# Patient Record
Sex: Male | Born: 1995 | Race: Black or African American | Hispanic: No | Marital: Single | Smoking: Current every day smoker
Health system: Southern US, Community
[De-identification: ages and names within clinical notes are randomized; demographics above are authoritative.]

## PROBLEM LIST (undated history)

## (undated) DIAGNOSIS — Z5900 Homelessness unspecified: Secondary | ICD-10-CM

## (undated) DIAGNOSIS — S83289A Other tear of lateral meniscus, current injury, unspecified knee, initial encounter: Secondary | ICD-10-CM

## (undated) DIAGNOSIS — M2390 Unspecified internal derangement of unspecified knee: Secondary | ICD-10-CM

## (undated) HISTORY — PX: APPENDECTOMY: SHX54

---
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
## 2015-07-04 ENCOUNTER — Encounter (HOSPITAL_COMMUNITY): Payer: Self-pay | Admitting: Emergency Medicine

## 2015-07-04 ENCOUNTER — Emergency Department (HOSPITAL_COMMUNITY): Payer: Medicaid Other

## 2015-07-04 ENCOUNTER — Emergency Department (HOSPITAL_COMMUNITY)
Admission: EM | Admit: 2015-07-04 | Discharge: 2015-07-04 | Disposition: A | Discharge status: Home / Self Care | Payer: Medicaid Other | Attending: Emergency Medicine | Admitting: Emergency Medicine

## 2015-07-04 DIAGNOSIS — S8991XA Unspecified injury of right lower leg, initial encounter: Secondary | ICD-10-CM | POA: Diagnosis present

## 2015-07-04 DIAGNOSIS — Y9367 Activity, basketball: Secondary | ICD-10-CM | POA: Insufficient documentation

## 2015-07-04 DIAGNOSIS — X58XXXA Exposure to other specified factors, initial encounter: Secondary | ICD-10-CM | POA: Diagnosis not present

## 2015-07-04 DIAGNOSIS — Y999 Unspecified external cause status: Secondary | ICD-10-CM | POA: Insufficient documentation

## 2015-07-04 DIAGNOSIS — Y9289 Other specified places as the place of occurrence of the external cause: Secondary | ICD-10-CM | POA: Diagnosis not present

## 2015-07-04 DIAGNOSIS — F17210 Nicotine dependence, cigarettes, uncomplicated: Secondary | ICD-10-CM | POA: Diagnosis not present

## 2015-07-04 DIAGNOSIS — Z96652 Presence of left artificial knee joint: Secondary | ICD-10-CM | POA: Diagnosis not present

## 2015-07-04 DIAGNOSIS — S8391XA Sprain of unspecified site of right knee, initial encounter: Secondary | ICD-10-CM | POA: Diagnosis not present

## 2015-07-04 MED ORDER — MELOXICAM 15 MG PO TABS: 15.0000 mg | ORAL_TABLET | Freq: Every day | ORAL | Status: DC

## 2015-07-04 NOTE — Discharge Instructions (Signed)
NO Exercising until cleared by orthopedics doctor. Keep the knee elevated when at home, ice several times a day. Avoid strenuous activity. Mobic as prescribed for pain and inflammation. Call orthopedic specialist tomorrow to get an appointment.  Knee Sprain A knee sprain is a tear in one of the strong, fibrous tissues that connect the bones (ligaments) in your knee. The severity of the sprain depends on how much of the ligament is torn. The tear can be either partial or complete. CAUSES  Often, sprains are a result of a fall or injury. The force of the impact causes the fibers of your ligament to stretch too much. This excess tension causes the fibers of your ligament to tear. SIGNS AND SYMPTOMS  You may have some loss of motion in your knee. Other symptoms include:  Bruising.  Pain in the knee area.  Tenderness of the knee to the touch.  Swelling. DIAGNOSIS  To diagnose a knee sprain, your health care provider will physically examine your knee. Your health care provider may also suggest an X-ray exam of your knee to make sure no bones are broken. TREATMENT  If your ligament is only partially torn, treatment usually involves keeping the knee in a fixed position (immobilization) or bracing your knee for activities that require movement for several weeks. To do this, your health care provider will apply a bandage, cast, or splint to keep your knee from moving and to support your knee during movement until it heals. For a partially torn ligament, the healing process usually takes 4-6 weeks. If your ligament is completely torn, depending on which ligament it is, you may need surgery to reconnect the ligament to the bone or reconstruct it. After surgery, a cast or splint may be applied and will need to stay on your knee for 4-6 weeks while your ligament heals. HOME CARE INSTRUCTIONS  Keep your injured knee elevated to decrease swelling.  To ease pain and swelling, apply ice to the injured  area:  Put ice in a plastic bag.  Place a towel between your skin and the bag.  Leave the ice on for 20 minutes, 2-3 times a day.  Only take medicine for pain as directed by your health care provider.  Do not leave your knee unprotected until pain and stiffness go away (usually 4-6 weeks).  If you have a cast or splint, do not allow it to get wet. If you have been instructed not to remove it, cover it with a plastic bag when you shower or bathe. Do not swim.  Your health care provider may suggest exercises for you to do during your recovery to prevent or limit permanent weakness and stiffness. SEEK IMMEDIATE MEDICAL CARE IF:  Your cast or splint becomes damaged.  Your pain becomes worse.  You have significant pain, swelling, or numbness below the cast or splint. MAKE SURE YOU:  Understand these instructions.  Will watch your condition.  Will get help right away if you are not doing well or get worse.   This information is not intended to replace advice given to you by your health care provider. Make sure you discuss any questions you have with your health care provider.   Document Released: 01/26/2005 Document Revised: 02/16/2014 Document Reviewed: 09/07/2012 Elsevier Interactive Patient Education [employer]2016 Elsevier Inc.

## 2015-07-04 NOTE — ED Provider Notes (Signed)
CSN: [id number]     Arrival date & time 07/04/15  1930 History  By signing my name below, I, Bryan Payne, attest that this documentation has been prepared under the direction and in the presence of non-physician practitioner, Jaynie Crumble, PA-C. Electronically Signed: Marisue Payne, Scribe. 07/04/2015. 8:33 PM.    Chief Complaint  Patient presents with  . Knee Pain    The history is provided by the patient. No language interpreter was used.   HPI Comments:  Bryan Payne is a 20 y.o. male with PMHx of left knee lateral meniscus tear who presents to the Emergency Department complaining of 6/10 right knee pain worst over his right outer kneecap. Pt was playing basketball 9 days ago and had knee to knee contact with another player. Pt states he felt a pop in the knee. He recently started a new job that requires a lot of standing and he noticed worsening swelling to his right knee in the past week. He has also been doing leg extensions at the gym since the injury. Pt reports he wrapped the knee for a week after the injury; no other treatments attempted PTA.   Past Medical History  Diagnosis Date  . Knee derangement 04/2013    left  . Lateral meniscus tear 04/2013    left knee   Past Surgical History  Procedure Laterality Date  . Laparoscopic appendectomy Right 10/13/2012    Procedure: APPENDECTOMY LAPAROSCOPIC;  Surgeon: Mariella Saa, MD;  Location: [facility];  Service: General;  Laterality: Right;  . Knee arthroscopy with lateral menisectomy Left 05/05/2013    Procedure: LEFT KNEE ARTHROSCOPY WITH LATERAL MENISCAL REPAIR;  Surgeon: Eulas Post, MD;  Location: [facility];  Service: Orthopedics;  Laterality: Left;  . Appendectomy     Family History  Problem Relation Age of Onset  . Kidney failure Father     Renal transplant  . Diabetes Other     Family on father's side   Social History  Substance Use Topics  . Smoking status: Current Some Day  Smoker    Types: Cigars  . Smokeless tobacco: Never Used  . Alcohol Use: No    Review of Systems  Musculoskeletal: Positive for joint swelling and arthralgias.    Allergies  Vicks vaporub  Home Medications   Prior to Admission medications   Medication Sig Start Date End Date Taking? Authorizing Provider  acetaminophen (TYLENOL) 325 MG tablet Take 650 mg by mouth every 6 (six) hours as needed for moderate pain.    Historical Provider, MD   BP 112/58 mmHg  Pulse 45  Temp(Src) 98.3 F (36.8 C) (Oral)  Resp 14  Ht  (1.727 m)  Wt 140 lb (63.504 kg)  BMI 21.29 kg/m2  SpO2 100%   Physical Exam  Constitutional: He is oriented to person, place, and time. He appears well-developed and well-nourished. No distress.  HENT:  Head: Normocephalic and atraumatic.  Eyes: Right eye exhibits no discharge. Left eye exhibits no discharge.  Pulmonary/Chest: Effort normal. No respiratory distress.  Musculoskeletal:  Joint effusion in the right knee. It does not appear to be warm to the touch for erythematous. Tender to palpation over lateral joint. Full ROM of the knee joint. Pain with full flexion and extension. Negative anterior and posterior drawer signs. No laxity or pain with medial or lateral stress.  DP pulse intact. Normal hip and ankle  Neurological: He is alert and oriented to person, place, and time. Coordination normal.  Skin: No rash noted. He is not diaphoretic.  Psychiatric: He has a normal mood and affect. His behavior is normal.  Nursing note and vitals reviewed.   ED Course  Procedures  DIAGNOSTIC STUDIES:  Oxygen Saturation is 100% on RA, normal by my interpretation.    COORDINATION OF CARE:  8:21 PM Will refer pt to Dr. Dion SaucierLandau. Discussed treatment plan with pt at bedside and pt agreed to plan.  Labs Review Labs Reviewed - No data to display  Imaging Review Dg Knee Complete 4 Views Right  07/04/2015  CLINICAL DATA:  Acute onset of right knee pain. Injury to  knee while playing basketball. Felt pop in the knee. Initial encounter. EXAM: RIGHT KNEE - COMPLETE 4+ VIEW COMPARISON:  None. FINDINGS: There is no evidence of fracture or dislocation. The joint spaces are preserved. No significant degenerative change is seen; the patellofemoral joint is grossly unremarkable in appearance. A small knee joint effusion is noted. The visualized soft tissues are otherwise unremarkable in appearance. IMPRESSION: 1. No evidence of fracture or dislocation. 2. Small knee joint effusion noted. If the patient's symptoms persist, MRI would be helpful for further evaluation, to assess for internal derangement. Electronically Signed   By: Roanna RaiderJeffery  Chang M.D.   On: 07/04/2015 20:27   I have personally reviewed and evaluated these images and lab results as part of my medical decision-making.   EKG Interpretation None      MDM   Final diagnoses:  Right knee sprain, initial encounter   Patient emergency department with right knee injury. Patient does have a joint effusion exam, however he still has full range of motion of the knee joint, is not erythematous or warm to touch. I am not concerned about joint infection. Exam was most consistent with knee sprain, possible tear to the ligamentous structures are meniscus. Patient does not want a knee immobilizer crutches. He states he has to go to work tomorrow. I will give him a knee sleeve for support. Instructed to keep his knee elevated and ice several times a day. We'll start him moving. Patient has seen Dr. Dion SaucierLandau in the past and would like to go see him again. I will refer him back to Dr. Dion SaucierLandau. Dr. Eulah PontMurphy is on call, and is in the same office.  Filed Vitals:   07/04/15 1955  BP: 112/58  Pulse: 45  Temp: 98.3 F (36.8 C)  TempSrc: Oral  Resp: 14  Height: 5\' 8"  (1.727 m)  Weight: 63.504 kg  SpO2: 100%    I personally performed the services described in this documentation, which was scribed in my presence. The recorded  information has been reviewed and is accurate.   Jaynie Crumbleatyana Anndee Connett, PA-C 07/04/15 2207  Rolan BuccoMelanie Belfi, MD 07/04/15 2258

## 2015-07-04 NOTE — ED Notes (Addendum)
Pt c/o right knee pain after having knee to knee contact playing basketball on 5/16.  Pt walk around at work and has had swelling and pain since 5/16.  Pain 6/10 with increasing pain bending the right knee. Ambulatory at present.

## 2016-02-16 ENCOUNTER — Emergency Department (HOSPITAL_COMMUNITY)
Admission: EM | Admit: 2016-02-16 | Discharge: 2016-02-17 | Disposition: A | Discharge status: Home / Self Care | Payer: Medicaid Other | Attending: Emergency Medicine | Admitting: Emergency Medicine

## 2016-02-16 ENCOUNTER — Encounter (HOSPITAL_COMMUNITY): Payer: Self-pay | Admitting: Emergency Medicine

## 2016-02-16 DIAGNOSIS — Z7982 Long term (current) use of aspirin: Secondary | ICD-10-CM | POA: Diagnosis not present

## 2016-02-16 DIAGNOSIS — F329 Major depressive disorder, single episode, unspecified: Secondary | ICD-10-CM | POA: Insufficient documentation

## 2016-02-16 DIAGNOSIS — F1014 Alcohol abuse with alcohol-induced mood disorder: Secondary | ICD-10-CM | POA: Insufficient documentation

## 2016-02-16 DIAGNOSIS — Z79899 Other long term (current) drug therapy: Secondary | ICD-10-CM | POA: Diagnosis not present

## 2016-02-16 DIAGNOSIS — Z833 Family history of diabetes mellitus: Secondary | ICD-10-CM | POA: Diagnosis not present

## 2016-02-16 DIAGNOSIS — F17210 Nicotine dependence, cigarettes, uncomplicated: Secondary | ICD-10-CM | POA: Diagnosis not present

## 2016-02-16 DIAGNOSIS — Z9889 Other specified postprocedural states: Secondary | ICD-10-CM | POA: Diagnosis not present

## 2016-02-16 LAB — COMPREHENSIVE METABOLIC PANEL
ALK PHOS: 45 U/L (ref 38–126)
ALT: 16 U/L — ABNORMAL LOW (ref 17–63)
ANION GAP: 12 (ref 5–15)
AST: 25 U/L (ref 15–41)
Albumin: 5.5 g/dL — ABNORMAL HIGH (ref 3.5–5.0)
BUN: 18 mg/dL (ref 6–20)
CALCIUM: 9.4 mg/dL (ref 8.9–10.3)
CHLORIDE: 100 mmol/L — AB (ref 101–111)
CO2: 23 mmol/L (ref 22–32)
Creatinine, Ser: 0.96 mg/dL (ref 0.61–1.24)
GFR calc non Af Amer: >60 mL/min (ref >60)
Glucose, Bld: 83 mg/dL (ref 65–99)
POTASSIUM: 3.5 mmol/L (ref 3.5–5.1)
SODIUM: 135 mmol/L (ref 135–145)
Total Bilirubin: 1.4 mg/dL — ABNORMAL HIGH (ref 0.3–1.2)
Total Protein: 8.2 g/dL — ABNORMAL HIGH (ref 6.5–8.1)

## 2016-02-16 LAB — RAPID URINE DRUG SCREEN, HOSP PERFORMED
Amphetamines: NOT DETECTED
Barbiturates: NOT DETECTED
Benzodiazepines: NOT DETECTED
COCAINE: NOT DETECTED
OPIATES: NOT DETECTED
TETRAHYDROCANNABINOL: POSITIVE — AB

## 2016-02-16 LAB — CBC
HCT: 45 % (ref 39.0–52.0)
Hemoglobin: 15.7 g/dL (ref 13.0–17.0)
MCH: 30.7 pg (ref 26.0–34.0)
MCHC: 34.9 g/dL (ref 30.0–36.0)
MCV: 88.1 fL (ref 78.0–100.0)
Platelets: 238 10*3/uL (ref 150–400)
RBC: 5.11 MIL/uL (ref 4.22–5.81)
RDW: 12.5 % (ref 11.5–15.5)
WBC: 8.9 10*3/uL (ref 4.0–10.5)

## 2016-02-16 LAB — ACETAMINOPHEN LEVEL

## 2016-02-16 LAB — ETHANOL: Alcohol, Ethyl (B): <5 mg/dL (ref <5)

## 2016-02-16 LAB — SALICYLATE LEVEL

## 2016-02-16 MED ORDER — ACETAMINOPHEN 325 MG PO TABS: 650.0000 mg | ORAL_TABLET | ORAL | Status: DC | PRN

## 2016-02-16 MED ORDER — LORAZEPAM 1 MG PO TABS: 1.0000 mg | ORAL_TABLET | Freq: Three times a day (TID) | ORAL | Status: DC | PRN

## 2016-02-16 NOTE — BHH Counselor (Signed)
Spoke w/ Jeanice LimHolly, RN who reports she will need 5 minutes in order to set up the machine for the TTS assessment.   Princess BruinsAquicha Duff, MSW, Theresia MajorsLCSWA

## 2016-02-16 NOTE — ED Provider Notes (Signed)
[facility]-EM[facility]ENCY [facility]PT Provider Note   CSN: [id number] Arrival date & time: 02/16/16  2019     History   Chief Complaint Chief Complaint  Patient presents with  . Medical Clearance    HPI Bryan Payne is a 21 y.o. male.  Patient is a 21 year old male who presents with worsening depression and thoughts of suicide. He states that he's had a lot of things she's had a deal with over the last month and he feels like his depression has gotten worse. He also has a problem controlling his anger. He feels like he wants to punch things. He's had vague thoughts of suicide today but no definite plan. He denies any physical complaints other than he has some ongoing knee pain. He denies any recent injuries. He does occasionally drink alcohol but denies any recent EtOH or drug use.      Past Medical History:  Diagnosis Date  . Knee derangement 04/2013   left  . Lateral meniscus tear 04/2013   left knee    Patient Active Problem List   Diagnosis Date Noted  . Mushroom poisoning 12/22/2014  . Acute renal failure (HCC) 12/22/2014  . Marijuana abuse 12/22/2014  . Dehydration 12/22/2014  . Ingestion of unknown nonmedicinal substance 12/21/2014  . Rectal bleeding 12/21/2014  . Generalized abdominal pain 12/21/2014  . Lateral meniscus tear 05/05/2013  . Appendicitis, acute-gangrenous s/p laparoscopic appendectomy  10/13/2012    Past Surgical History:  Procedure Laterality Date  . APPENDECTOMY    . KNEE ARTHROSCOPY WITH LATERAL MENISECTOMY Left 05/05/2013   Procedure: LEFT KNEE ARTHROSCOPY WITH LATERAL MENISCAL REPAIR;  Surgeon: Eulas PostJoshua P Landau, MD;  Location: [facility];  Service: Orthopedics;  Laterality: Left;  . LAPAROSCOPIC APPENDECTOMY Right 10/13/2012   Procedure: APPENDECTOMY LAPAROSCOPIC;  Surgeon: Mariella SaaBenjamin T Hoxworth, MD;  Location: [facility];  Service: General;  Laterality: Right;       Home Medications    Prior to Admission medications   Medication Sig Start  Date End Date Taking? Authorizing Provider  acetaminophen (TYLENOL) 325 MG tablet Take 650 mg by mouth every 6 (six) hours as needed for moderate pain.   Yes Historical Provider, MD  Aspirin-Acetaminophen (GOODY BODY PAIN) 500-325 MG PACK Take 1 Package by mouth every 6 (six) hours as needed.   Yes Historical Provider, MD    Family History Family History  Problem Relation Age of Onset  . Kidney failure Father     Renal transplant  . Diabetes Other     Family on father's side    Social History Social History  Substance Use Topics  . Smoking status: Current Every Day Smoker    Types: Cigars, Cigarettes  . Smokeless tobacco: Never Used  . Alcohol use Yes     Comment: "often"     Allergies   Vicks vaporub [camph-eucalypt-men-turp-pet]   Review of Systems Review of Systems  Constitutional: Negative for chills, diaphoresis, fatigue and fever.  HENT: Negative for congestion, rhinorrhea and sneezing.   Eyes: Negative.   Respiratory: Negative for cough, chest tightness and shortness of breath.   Cardiovascular: Negative for chest pain and leg swelling.  Gastrointestinal: Negative for abdominal pain, blood in stool, diarrhea, nausea and vomiting.  Genitourinary: Negative for difficulty urinating, flank pain, frequency and hematuria.  Musculoskeletal: Positive for arthralgias. Negative for back pain.  Skin: Negative for rash.  Neurological: Negative for dizziness, speech difficulty, weakness, numbness and headaches.  Psychiatric/Behavioral: Positive for dysphoric mood and suicidal ideas.  Physical Exam Updated Vital Signs BP 120/69 (BP Location: Right Arm)   Pulse (!) 57   Temp 98 F (36.7 C) (Oral)   Resp 15   SpO2 97%   Physical Exam  Constitutional: He is oriented to person, place, and time. He appears well-developed and well-nourished.  HENT:  Head: Normocephalic and atraumatic.  Eyes: Pupils are equal, round, and reactive to light.  Neck: Normal range of  motion. Neck supple.  Cardiovascular: Normal rate, regular rhythm and normal heart sounds.   Pulmonary/Chest: Effort normal and breath sounds normal. No respiratory distress. He has no wheezes. He has no rales. He exhibits no tenderness.  Abdominal: Soft. Bowel sounds are normal. There is no tenderness. There is no rebound and no guarding.  Musculoskeletal: Normal range of motion. He exhibits no edema.  No swelling or pain on palpation of the knees  Lymphadenopathy:    He has no cervical adenopathy.  Neurological: He is alert and oriented to person, place, and time.  Skin: Skin is warm and dry. No rash noted.  Psychiatric: He has a normal mood and affect.     ED Treatments / Results  Labs (all labs ordered are listed, but only abnormal results are displayed) Labs Reviewed  COMPREHENSIVE METABOLIC PANEL - Abnormal; Notable for the following:       Result Value   Chloride 100 (*)    Total Protein 8.2 (*)    Albumin 5.5 (*)    ALT 16 (*)    Total Bilirubin 1.4 (*)    All other components within normal limits  ACETAMINOPHEN LEVEL - Abnormal; Notable for the following:    Acetaminophen (Tylenol), Serum <10 (*)    All other components within normal limits  RAPID URINE DRUG SCREEN, HOSP PERFORMED - Abnormal; Notable for the following:    Tetrahydrocannabinol POSITIVE (*)    All other components within normal limits  ETHANOL  SALICYLATE LEVEL  CBC    EKG  EKG Interpretation None       Radiology No results found.  Procedures Procedures (including critical care time)  Medications Ordered in ED Medications  LORazepam (ATIVAN) tablet 1 mg (not administered)  acetaminophen (TYLENOL) tablet 650 mg (not administered)     Initial Impression / Assessment and Plan / ED Course  I have reviewed the triage vital signs and the nursing notes.  Pertinent labs & imaging results that were available during my care of the patient were reviewed by me and considered in my medical  decision making (see chart for details).  Clinical Course     Patient is medically cleared, awaiting TTS evaluation  Final Clinical Impressions(s) / ED Diagnoses   Final diagnoses:  Depression, unspecified depression type    New Prescriptions New Prescriptions   No medications on file     Rolan Bucco, MD 02/16/16 2224

## 2016-02-16 NOTE — ED Notes (Signed)
MD at bedside. 

## 2016-02-16 NOTE — BHH Counselor (Signed)
[facility] and she reports she attempted to complete the assessment in person but the pt was unresponsive.  Bryan BruinsAquicha Duff, MSW, Theresia MajorsLCSWA

## 2016-02-16 NOTE — ED Triage Notes (Signed)
Pt come to ER c/o feeling depressed and overwhelmed; states he's felt "off and on" like this for years; pt has passive SI without plan or intent; endorses impulsivity and decreased appetitie; also states he has been "in my head" and "thinking too much"; denies hallucinations; reports distress associated with thoughts of harming others he gets in arguments with; denies pain; A&Ox4

## 2016-02-16 NOTE — ED Notes (Signed)
Pt changed into scrubs and wanded by security  

## 2016-02-17 DIAGNOSIS — Z841 Family history of disorders of kidney and ureter: Secondary | ICD-10-CM

## 2016-02-17 DIAGNOSIS — F17210 Nicotine dependence, cigarettes, uncomplicated: Secondary | ICD-10-CM

## 2016-02-17 DIAGNOSIS — Z79899 Other long term (current) drug therapy: Secondary | ICD-10-CM | POA: Diagnosis not present

## 2016-02-17 DIAGNOSIS — F1014 Alcohol abuse with alcohol-induced mood disorder: Secondary | ICD-10-CM | POA: Diagnosis present

## 2016-02-17 DIAGNOSIS — Z888 Allergy status to other drugs, medicaments and biological substances status: Secondary | ICD-10-CM

## 2016-02-17 DIAGNOSIS — Z9889 Other specified postprocedural states: Secondary | ICD-10-CM

## 2016-02-17 DIAGNOSIS — Z833 Family history of diabetes mellitus: Secondary | ICD-10-CM

## 2016-02-17 NOTE — Consult Note (Signed)
[place] [facility] Consult   Reason for Consult:  Alcohol abuse and depression Referring Physician:  EDP Patient Identification: KHOI HAMBERGER MRN:  [id number] Principal Diagnosis: Alcohol abuse with alcohol-induced mood disorder [facility]) Diagnosis:   Patient Active Problem List   Diagnosis Date Noted  . Alcohol abuse with alcohol-induced mood disorder ([place]) [F10.14] 02/17/2016    Priority: High  . Mushroom poisoning [T62.0X1A] 12/22/2014  . Acute renal failure ([place]) [N17.9] 12/22/2014  . Marijuana abuse [F12.10] 12/22/2014  . Dehydration [E86.0] 12/22/2014  . Ingestion of unknown nonmedicinal substance [T65.91XA] 12/21/2014  . Rectal bleeding [K62.5] 12/21/2014  . Generalized abdominal pain [R10.84] 12/21/2014  . Lateral meniscus tear [S83.289A] 05/05/2013  . Appendicitis, acute-gangrenous s/p laparoscopic appendectomy  [K35.80] 10/13/2012    Total Time spent with patient: 45 minutes  Subjective:   SIRIS HOOS is a 21 y.o. male patient states, "Just need some resources for alcohol abuse."  HPI:  21 yo male who presented to the ED with requests for alcohol abuse resources.  Denies suicidal/homicidal ideations, hallucinations, and withdrawal symptoms.  He has tried AA in the past.  Referral to [facility] for alcohol abuse assistance.  Stable for discharge.  Past Psychiatric History: substance abuse  Risk to Self: Suicidal Ideation: No-Not Currently/Within Last 6 Months Suicidal Intent: No Is patient at risk for suicide?: No Suicidal Plan?: No Access to Means: No What has been your use of drugs/alcohol within the last 12 months?: Marijuana, cigarettes and alcohol. How many times?: 0 Other Self Harm Risks: NA Triggers for Past Attempts: None known Intentional Self Injurious Behavior: None (Pt denies. ) Risk to Others: None Prior Inpatient Therapy: Prior Inpatient Therapy: No Prior Therapy Dates: NA Prior Therapy Facilty/Provider(s): NA Reason for  Treatment: NA Prior Outpatient Therapy: Prior Outpatient Therapy: No Prior Therapy Dates: NA Prior Therapy Facilty/Provider(s): NA Reason for Treatment: NA Does patient have an ACCT team?: No Does patient have Intensive In-House Services?  : No Does patient have [facility] services? : No Does patient have P4CC services?: No  Past Medical History:  Past Medical History:  Diagnosis Date  . Knee derangement 04/2013   left  . Lateral meniscus tear 04/2013   left knee    Past Surgical History:  Procedure Laterality Date  . APPENDECTOMY    . KNEE ARTHROSCOPY WITH LATERAL MENISECTOMY Left 05/05/2013   Procedure: LEFT KNEE ARTHROSCOPY WITH LATERAL MENISCAL REPAIR;  Surgeon: Johnny Bridge, MD;  Location: [place];  Service: Orthopedics;  Laterality: Left;  . LAPAROSCOPIC APPENDECTOMY Right 10/13/2012   Procedure: APPENDECTOMY LAPAROSCOPIC;  Surgeon: Edward Jolly, MD;  Location[facility];  Service: General;  Laterality: Right;   Family History:  Family History  Problem Relation Age of Onset  . Kidney failure Father     Renal transplant  . Diabetes Other     Family on father's side   Family Psychiatric  History: none Social History:  History  Alcohol Use  . Yes    Comment: "often"     History  Drug Use  . Types: Marijuana    Comment: daily    Social History   Social History  . Marital status: Single    Spouse name: N/A  . Number of children: N/A  . Years of education: N/A   Social History Main Topics  . Smoking status: Current Every Day Smoker    Types: Cigars, Cigarettes  . Smokeless tobacco: Never Used  . Alcohol use Yes     Comment: "  often"  . Drug use:     Types: Marijuana     Comment: daily  . Sexual activity: No   Other Topics Concern  . None   Social History Narrative  . None   Additional Social History:    Allergies:   Allergies  Allergen Reactions  . Vicks Vaporub [Camph-Eucalypt-Men-Turp-Pet] Other (See Comments)    Caused  eye irritation and shortness of breath    Labs:  Results for orders placed or performed during the hospital encounter of 02/16/16 (from the past 48 hour(s))  Rapid urine drug screen (hospital performed)     Status: Abnormal   Collection Time: 02/16/16  9:08 PM  Result Value Ref Range   Opiates NONE DETECTED NONE DETECTED   Cocaine NONE DETECTED NONE DETECTED   Benzodiazepines NONE DETECTED NONE DETECTED   Amphetamines NONE DETECTED NONE DETECTED   Tetrahydrocannabinol POSITIVE (A) NONE DETECTED   Barbiturates NONE DETECTED NONE DETECTED    Comment:        DRUG SCREEN FOR MEDICAL PURPOSES ONLY.  IF CONFIRMATION IS NEEDED FOR ANY PURPOSE, NOTIFY LAB WITHIN 5 DAYS.        LOWEST DETECTABLE LIMITS FOR URINE DRUG SCREEN Drug Class       Cutoff (ng/mL) Amphetamine      1000 Barbiturate      200 Benzodiazepine   093 Tricyclics       818 Opiates          300 Cocaine          300 THC              50   Comprehensive metabolic panel     Status: Abnormal   Collection Time: 02/16/16  9:16 PM  Result Value Ref Range   Sodium 135 135 - 145 mmol/L   Potassium 3.5 3.5 - 5.1 mmol/L   Chloride 100 (L) 101 - 111 mmol/L   CO2 23 22 - 32 mmol/L   Glucose, Bld 83 65 - 99 mg/dL   BUN 18 6 - 20 mg/dL   Creatinine, Ser 0.96 0.61 - 1.24 mg/dL   Calcium 9.4 8.9 - 10.3 mg/dL   Total Protein 8.2 (H) 6.5 - 8.1 g/dL   Albumin 5.5 (H) 3.5 - 5.0 g/dL   AST 25 15 - 41 U/L   ALT 16 (L) 17 - 63 U/L   Alkaline Phosphatase 45 38 - 126 U/L   Total Bilirubin 1.4 (H) 0.3 - 1.2 mg/dL   GFR calc non Af Amer >60 >60 mL/min   GFR calc Af Amer >60 >60 mL/min    Comment: (NOTE) The eGFR has been calculated using the CKD EPI equation. This calculation has not been validated in all clinical situations. eGFR's persistently <60 mL/min signify possible Chronic Kidney Disease.    Anion gap 12 5 - 15  Ethanol     Status: None   Collection Time: 02/16/16  9:16 PM  Result Value Ref Range   Alcohol, Ethyl (B) <5  <5 mg/dL    Comment:        LOWEST DETECTABLE LIMIT FOR SERUM ALCOHOL IS 5 mg/dL FOR MEDICAL PURPOSES ONLY   Salicylate level     Status: None   Collection Time: 02/16/16  9:16 PM  Result Value Ref Range   Salicylate Lvl <2.9 2.8 - 30.0 mg/dL  Acetaminophen level     Status: Abnormal   Collection Time: 02/16/16  9:16 PM  Result Value Ref Range   Acetaminophen (Tylenol), Serum <  10 (L) 10 - 30 ug/mL    Comment:        THERAPEUTIC CONCENTRATIONS VARY SIGNIFICANTLY. A RANGE OF 10-30 ug/mL MAY BE AN EFFECTIVE CONCENTRATION FOR MANY PATIENTS. HOWEVER, SOME ARE BEST TREATED AT CONCENTRATIONS OUTSIDE THIS RANGE. ACETAMINOPHEN CONCENTRATIONS >150 ug/mL AT 4 HOURS AFTER INGESTION AND >50 ug/mL AT 12 HOURS AFTER INGESTION ARE OFTEN ASSOCIATED WITH TOXIC REACTIONS.   cbc     Status: None   Collection Time: 02/16/16  9:16 PM  Result Value Ref Range   WBC 8.9 4.0 - 10.5 K/uL   RBC 5.11 4.22 - 5.81 MIL/uL   Hemoglobin 15.7 13.0 - 17.0 g/dL   HCT 45.0 39.0 - 52.0 %   MCV 88.1 78.0 - 100.0 fL   MCH 30.7 26.0 - 34.0 pg   MCHC 34.9 30.0 - 36.0 g/dL   RDW 12.5 11.5 - 15.5 %   Platelets 238 150 - 400 K/uL    Current Facility-Administered Medications  Medication Dose Route Frequency Provider Last Rate Last Dose  . acetaminophen (TYLENOL) tablet 650 mg  650 mg Oral Q4H PRN Malvin Johns, MD       Current Outpatient Prescriptions  Medication Sig Dispense Refill  . acetaminophen (TYLENOL) 325 MG tablet Take 650 mg by mouth every 6 (six) hours as needed for moderate pain.    . Aspirin-Acetaminophen (GOODY BODY PAIN) 500-325 MG PACK Take 1 Package by mouth every 6 (six) hours as needed.      Musculoskeletal: Strength & Muscle Tone: within normal limits Gait & Station: normal Patient leans: N/A  Psychiatric Specialty Exam: Physical Exam  Constitutional: He is oriented to person, place, and time. He appears well-developed and well-nourished.  HENT:  Head: Normocephalic.   Respiratory: Effort normal.  Musculoskeletal: Normal range of motion.  Neurological: He is alert and oriented to person, place, and time.  Psychiatric: His speech is normal and behavior is normal. Judgment and thought content normal. Cognition and memory are normal. He exhibits a depressed mood.    Review of Systems  Psychiatric/Behavioral: Positive for depression and substance abuse.  All other systems reviewed and are negative.   Blood pressure 126/55, pulse (!) 56, temperature 98 F (36.7 C), temperature source Oral, resp. rate 15, SpO2 98 %.There is no height or weight on file to calculate BMI.  General Appearance: Casual  Eye Contact:  Good  Speech:  Normal Rate  Volume:  Normal  Mood:  Depressed, mild  Affect:  Congruent  Thought Process:  Coherent and Descriptions of Associations: Intact  Orientation:  Full (Time, Place, and Person)  Thought Content:  WDL and Logical  Suicidal Thoughts:  No  Homicidal Thoughts:  No  Memory:  Immediate;   Good Recent;   Good Remote;   Good  Judgement:  Fair  Insight:  Fair  Psychomotor Activity:  Normal  Concentration:  Concentration: Good and Attention Span: Good  Recall:  Good  Fund of Knowledge:  Fair  Language:  Good  Akathisia:  No  Handed:  Right  AIMS (if indicated):     Assets:  Housing Leisure Time Physical Health Resilience Social Support  ADL's:  Intact  Cognition:  WNL  Sleep:        Treatment Plan Summary: Daily contact with patient to assess and evaluate symptoms and progress in treatment, Medication management and Plan Alcohol abuse with alcohol-induced mood disorder ([place])  -Crisis stabilization -Medication management:  No medications started, outpatient referral -Individual and substance abuse counseling -Outpatient resources provided  Disposition: No evidence of imminent risk to self or others at present.    Waylan Boga, NP 02/17/2016 11:11 AM  Patient seen face-to-face for psychiatric evaluation,  chart reviewed and case discussed with the physician extender and developed treatment plan. Reviewed the information documented and agree with the treatment plan. Corena Pilgrim, MD

## 2016-02-17 NOTE — ED Notes (Signed)
TTS at bedside. 
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
## 2016-02-17 NOTE — Discharge Instructions (Signed)
For your ongoing behavioral health needs you are advised to follow up with [facility].  New patients are seen at their walk-in clinic.  Walk-in hours are Monday - Friday from 8:00 am - 12:00 pm, and from 1:00 pm - 3:00 pm.  Walk-in patients are seen on a first come, first served basis, so try to arrive as early as possible for the best chance of being seen the same day.  There is an initial fee of $22.50: ° °     [facility] °     [address] °     [place], [place] [address] °     [phone] °

## 2016-02-17 NOTE — BH Assessment (Signed)
[facility] Assessment Progress Note  Per Thedore MinsMojeed Akintayo, MD, this pt does not require psychiatric hospitalization at this time.  [facility].  This has been included in pt's discharge instructions.  Pt's nurse, Verlon AuLeslie, has been notified.  Doylene Canninghomas Skii Cleland, MA Triage Specialist [phone]

## 2016-02-18 NOTE — [facility] Suicide Risk Assessment (Signed)
Suicide Risk Assessment  Discharge Assessment   [facility]   Principal Problem: Alcohol abuse with alcohol-induced mood disorder [facility](HCC) Discharge Diagnoses:  Patient Active Problem List   Diagnosis Date Noted  . Alcohol abuse with alcohol-induced mood disorder (HCC) [F10.14] 02/17/2016    Priority: High  . Mushroom poisoning [T62.0X1A] 12/22/2014  . Acute renal failure (HCC) [N17.9] 12/22/2014  . Marijuana abuse [F12.10] 12/22/2014  . Dehydration [E86.0] 12/22/2014  . Ingestion of unknown nonmedicinal substance [T65.91XA] 12/21/2014  . Rectal bleeding [K62.5] 12/21/2014  . Generalized abdominal pain [R10.84] 12/21/2014  . Lateral meniscus tear [S83.289A] 05/05/2013  . Appendicitis, acute-gangrenous s/p laparoscopic appendectomy  [K35.80] 10/13/2012    Total Time spent with patient: 45 minutes  Musculoskeletal: Strength & Muscle Tone: within normal limits Gait & Station: normal Patient leans: N/A  Psychiatric Specialty Exam: Physical Exam  Constitutional: He is oriented to person, place, and time. He appears well-developed and well-nourished.  HENT:  Head: Normocephalic.  Respiratory: Effort normal.  Musculoskeletal: Normal range of motion.  Neurological: He is alert and oriented to person, place, and time.  Psychiatric: His speech is normal and behavior is normal. Judgment and thought content normal. Cognition and memory are normal. He exhibits a depressed mood.    Review of Systems  Psychiatric/Behavioral: Positive for depression and substance abuse.  All other systems reviewed and are negative.   Blood pressure 126/55, pulse (!) 56, temperature 98 F (36.7 C), temperature source Oral, resp. rate 15, SpO2 98 %.There is no height or weight on file to calculate BMI.  General Appearance: Casual  Eye Contact:  Good  Speech:  Normal Rate  Volume:  Normal  Mood:  Depressed, mild  Affect:  Congruent  Thought Process:  Coherent and Descriptions of  Associations: Intact  Orientation:  Full (Time, Place, and Person)  Thought Content:  WDL and Logical  Suicidal Thoughts:  No  Homicidal Thoughts:  No  Memory:  Immediate;   Good Recent;   Good Remote;   Good  Judgement:  Fair  Insight:  Fair  Psychomotor Activity:  Normal  Concentration:  Concentration: Good and Attention Span: Good  Recall:  Good  Fund of Knowledge:  Fair  Language:  Good  Akathisia:  No  Handed:  Right  AIMS (if indicated):     Assets:  Housing Leisure Time Physical Health Resilience Social Support  ADL's:  Intact  Cognition:  WNL  Sleep:      Mental Status Per Nursing Assessment::   On Admission:   alcohol abuse with depression  Demographic Factors:  Male and Adolescent or young adult  Loss Factors: NA  Historical Factors: NA  Risk Reduction Factors:   Sense of responsibility to family, Living with another person, especially a relative and Positive social support  Continued Clinical Symptoms:  Depression, mild  Cognitive Features That Contribute To Risk:  None    Suicide Risk:  Minimal: No identifiable suicidal ideation.  Patients presenting with no risk factors but with morbid ruminations; may be classified as minimal risk based on the severity of the depressive symptoms    Plan Of Care/Follow-up recommendations:  Activity:  as tolerated Diet:  heart healthy diet  LORD, JAMISON, NP 02/18/2016, 8:58 AM

## 2016-06-26 ENCOUNTER — Encounter (HOSPITAL_COMMUNITY): Payer: Self-pay | Admitting: Emergency Medicine

## 2016-06-26 ENCOUNTER — Emergency Department (HOSPITAL_COMMUNITY)
Admission: EM | Admit: 2016-06-26 | Discharge: 2016-06-26 | Disposition: A | Discharge status: Home / Self Care | Payer: Medicaid Other | Attending: Emergency Medicine | Admitting: Emergency Medicine

## 2016-06-26 DIAGNOSIS — Y929 Unspecified place or not applicable: Secondary | ICD-10-CM | POA: Insufficient documentation

## 2016-06-26 DIAGNOSIS — Y999 Unspecified external cause status: Secondary | ICD-10-CM | POA: Insufficient documentation

## 2016-06-26 DIAGNOSIS — Y939 Activity, unspecified: Secondary | ICD-10-CM | POA: Insufficient documentation

## 2016-06-26 DIAGNOSIS — Z7982 Long term (current) use of aspirin: Secondary | ICD-10-CM | POA: Insufficient documentation

## 2016-06-26 DIAGNOSIS — Z79899 Other long term (current) drug therapy: Secondary | ICD-10-CM | POA: Insufficient documentation

## 2016-06-26 DIAGNOSIS — W57XXXA Bitten or stung by nonvenomous insect and other nonvenomous arthropods, initial encounter: Secondary | ICD-10-CM

## 2016-06-26 DIAGNOSIS — S30860A Insect bite (nonvenomous) of lower back and pelvis, initial encounter: Secondary | ICD-10-CM | POA: Insufficient documentation

## 2016-06-26 DIAGNOSIS — F17210 Nicotine dependence, cigarettes, uncomplicated: Secondary | ICD-10-CM | POA: Insufficient documentation

## 2016-06-26 MED ORDER — DOXYCYCLINE HYCLATE 100 MG PO CAPS: 100.0000 mg | ORAL_CAPSULE | Freq: Two times a day (BID) | ORAL | 0 refills | Status: AC

## 2016-06-26 NOTE — ED Provider Notes (Signed)
[facility]-EMERGENCY DEPT Provider Note   CSN: [id number] Arrival date & time: 06/26/16  1801  By signing my name below, I, Sonum Patel, attest that this documentation has been prepared under the direction and in the presence of [address] [place], [place]. Electronically Signed: Sonum Allena Katz, Scribe. 06/26/16. 6:53 PM.  History   Chief Complaint Chief Complaint  Patient presents with  . Insect Bite    Tick    The history is provided by the patient. No language interpreter was used.    HPI Comments: Bryan Payne is a 21 y.o. male who presents to the Emergency Department complaining of a tick bite that occurred 2 days ago. He reports removing the tick from his left pelvic area. He now has a painful bump to the same area with associated redness, swelling and itching. He describes the pain as an aching sensation and rates it as 3/10 while walking.  He states applied pressure worsens his pain. He denies prior known tick bites. He denies discharge, other rashes, fever, chills, nausea, vomiting, diarrhea.    Past Medical History:  Diagnosis Date  . Knee derangement 04/2013   left  . Lateral meniscus tear 04/2013   left knee    Patient Active Problem List   Diagnosis Date Noted  . Alcohol abuse with alcohol-induced mood disorder (HCC) 02/17/2016  . Mushroom poisoning 12/22/2014  . Acute renal failure (HCC) 12/22/2014  . Marijuana abuse 12/22/2014  . Dehydration 12/22/2014  . Ingestion of unknown nonmedicinal substance 12/21/2014  . Rectal bleeding 12/21/2014  . Generalized abdominal pain 12/21/2014  . Lateral meniscus tear 05/05/2013  . Appendicitis, acute-gangrenous s/p laparoscopic appendectomy  10/13/2012    Past Surgical History:  Procedure Laterality Date  . APPENDECTOMY    . KNEE ARTHROSCOPY WITH LATERAL MENISECTOMY Left 05/05/2013   Procedure: LEFT KNEE ARTHROSCOPY WITH LATERAL MENISCAL REPAIR;  Surgeon: Eulas Post, MD;  Location: [facility];  Service:  Orthopedics;  Laterality: Left;  . LAPAROSCOPIC APPENDECTOMY Right 10/13/2012   Procedure: APPENDECTOMY LAPAROSCOPIC;  Surgeon: Mariella Saa, MD;  Location: [facility];  Service: General;  Laterality: Right;       Home Medications    Prior to Admission medications   Medication Sig Start Date End Date Taking? Authorizing Provider  acetaminophen (TYLENOL) 325 MG tablet Take 650 mg by mouth every 6 (six) hours as needed for moderate pain.    [provider]  Aspirin-Acetaminophen (GOODY BODY PAIN) 500-325 MG PACK Take 1 Package by mouth every 6 (six) hours as needed.    [provider]  doxycycline (VIBRAMYCIN) 100 MG capsule Take 1 capsule (100 mg total) by mouth 2 (two) times daily. 06/26/16 07/10/16  Alvina Chou, PA    Family History Family History  Problem Relation Age of Onset  . Kidney failure Father        Renal transplant  . Diabetes Other        Family on father's side    Social History Social History  Substance Use Topics  . Smoking status: Current Every Day Smoker    Packs/day: 0.50    Types: Cigars, Cigarettes  . Smokeless tobacco: Never Used  . Alcohol use Yes     Comment: "often"     Allergies   Vicks vaporub [camph-eucalypt-men-turp-pet]   Review of Systems Review of Systems  Constitutional: Negative for chills and fever.  Respiratory: Negative for shortness of breath.   Cardiovascular: Negative for chest pain.  Gastrointestinal: Negative for diarrhea, nausea  and vomiting.  Skin: Positive for wound.     Physical Exam Updated Vital Signs BP (!) 101/52 (BP Location: Right Arm)   Pulse 72   Temp 99 F (37.2 C) (Oral)   Resp 16   Ht 5\' 6"  (1.676 m)   Wt 130 lb (59 kg)   SpO2 96%   BMI 20.98 kg/m   Physical Exam  Constitutional: He appears well-developed and well-nourished. No distress.  Well appearing. Airway intact. No drooling, stridor, trismus. Handling secretions well. No evidence of mouth, lips, tongue  swelling.  HENT:  Head: Normocephalic and atraumatic.  Nose: Nose normal.  Mouth/Throat: Oropharynx is clear and moist.  Eyes: Conjunctivae and EOM are normal. Pupils are equal, round, and reactive to light.  Neck: Normal range of motion.  Cardiovascular: Normal rate, regular rhythm, normal heart sounds and intact distal pulses.   No murmur heard. Pulmonary/Chest: Effort normal and breath sounds normal. No stridor. No respiratory distress. He has no wheezes. He has no rales.  Normal work of breathing. No respiratory distress noted.   Abdominal: Soft. Bowel sounds are normal. There is no tenderness. There is no rebound and no guarding.  Musculoskeletal: Normal range of motion.  Neurological: He is alert.  Skin: Skin is warm. Capillary refill takes less than 2 seconds.  2.5 cm diameter purple dot with lesion in the center. Mildly tender to palpation. No discharge noted. No bulla, vesicles, or evidence of secondary infection. No other evidence of rash to her wrists, face, torso, back, upper chambers, lower extremities.  Psychiatric: He has a normal mood and affect. His behavior is normal.  Nursing note and vitals reviewed.    ED Treatments / Results  DIAGNOSTIC STUDIES: Oxygen Saturation is 96% on RA, adequate by my interpretation.    COORDINATION OF CARE: 6:51 PM Discussed treatment plan with pt at bedside and pt agreed to plan.   Labs (all labs ordered are listed, but only abnormal results are displayed) Labs Reviewed - No data to display  EKG  EKG Interpretation None       Radiology No results found.  Procedures Procedures (including critical care time)  Medications Ordered in ED Medications - No data to display   Initial Impression / Assessment and Plan / ED Course  I have reviewed the triage vital signs and the nursing notes.  Pertinent labs & imaging results that were available during my care of the patient were reviewed by me and considered in my medical  decision making (see chart for details).    Patient with alleged tick bite x 2 days. Worsening surrounding erythema around the area. Slightly tender to palpation. No discharge. No signs of allergic reaction.  Suspect possible [facility] fever, although he does not appear to have the classic symptoms of a target lesion, erythema migrans rash on wrists.  Patient denies any difficulty breathing or swallowing.  Pt has a patent airway without stridor and is handling secretions without difficulty; no angioedema. No blisters, no pustules, no warmth, no draining sinus tracts, no superficial abscesses, no bullous impetigo, no vesicles, no desquamation.  No concern for superimposed infection. No concern for SJS, TEN, TSS, syphilis or other life-threatening condition. Patient will be treated prophylactically and given doxycycline. I feel safe for discharge at this time. Patient is afebrile, hemodynamically stable, in no apparent distress. Patient is to follow-up with his primary care provider regarding this visit. Reasons to immediately return to ED discussed.  Final Clinical Impressions(s) / ED Diagnoses  Final diagnoses:  Tick bite, initial encounter    New Prescriptions New Prescriptions   DOXYCYCLINE (VIBRAMYCIN) 100 MG CAPSULE    Take 1 capsule (100 mg total) by mouth 2 (two) times daily.   I personally performed the services described in this documentation, which was scribed in my presence. The recorded information has been reviewed and is accurate.   Candie Mile [place], [place] 06/26/16 1912    Tilden Fossa, MD 06/27/16 [phone]

## 2016-06-26 NOTE — Discharge Instructions (Signed)
Please take doxycycline 2 times a day daily for 14 days. Please make sure to complete the course as prescribed. Please drink plenty of fluids throughout the day. Keep area clean and dry.   Get help right away if: You are not able to remove a tick. A part of a tick breaks off and gets stuck in your skin. Your symptoms get worse. Get help right away if: Your symptoms get worse. You feel very sleepy. You develop vomiting or diarrhea that persists. You notice red streaks coming from the infected area. Your red area gets larger or turns dark in color.

## 2016-06-26 NOTE — ED Triage Notes (Signed)
Pt c/o tick bite x3 days ago. Located on L pelvic region. Pt states he removed tick however now c/o swelling and itching.

## 2017-04-03 ENCOUNTER — Other Ambulatory Visit: Payer: Self-pay

## 2017-04-03 ENCOUNTER — Emergency Department (HOSPITAL_COMMUNITY)
Admission: EM | Admit: 2017-04-03 | Discharge: 2017-04-03 | Disposition: A | Discharge status: Home / Self Care | Payer: Self-pay | Attending: Physician Assistant | Admitting: Physician Assistant

## 2017-04-03 DIAGNOSIS — Y999 Unspecified external cause status: Secondary | ICD-10-CM | POA: Insufficient documentation

## 2017-04-03 DIAGNOSIS — Z590 Homelessness: Secondary | ICD-10-CM | POA: Insufficient documentation

## 2017-04-03 DIAGNOSIS — Y939 Activity, unspecified: Secondary | ICD-10-CM | POA: Insufficient documentation

## 2017-04-03 DIAGNOSIS — S70362A Insect bite (nonvenomous), left thigh, initial encounter: Secondary | ICD-10-CM | POA: Insufficient documentation

## 2017-04-03 DIAGNOSIS — Y929 Unspecified place or not applicable: Secondary | ICD-10-CM | POA: Insufficient documentation

## 2017-04-03 DIAGNOSIS — W57XXXA Bitten or stung by nonvenomous insect and other nonvenomous arthropods, initial encounter: Secondary | ICD-10-CM | POA: Insufficient documentation

## 2017-04-03 DIAGNOSIS — F17210 Nicotine dependence, cigarettes, uncomplicated: Secondary | ICD-10-CM | POA: Insufficient documentation

## 2017-04-03 MED ORDER — DOXYCYCLINE HYCLATE 100 MG PO TABS
100.0000 mg | ORAL_TABLET | Freq: Once | ORAL | Status: AC
  Administered 2017-04-03: 100 mg via ORAL
  Filled 2017-04-03: qty 1

## 2017-04-03 MED ORDER — DOXYCYCLINE HYCLATE 100 MG PO CAPS: 100.0000 mg | ORAL_CAPSULE | Freq: Two times a day (BID) | ORAL | 0 refills | Status: AC

## 2017-04-03 NOTE — Discharge Instructions (Addendum)
Keep area around the skin lesion clean. Use alcohol wipe to wipe the area at least once daily and apply thin layer of antibiotic ointment. Avoid itching or scratching to prevent further inflammation and infection. Return to the emergency department if there is increased swelling, redness, pain, warmth, pus, fevers.Guidelines recommend treatment within 72 hours of tick exposure, however given lymph nodes swelling will treat you for lyme disease at this time as a preventative measure.   Lyme disease symptoms include fatigue, decreased appetite, recurrent headaches, diffuse body and bone pain, fevers.

## 2017-04-03 NOTE — ED Provider Notes (Signed)
[facility]-EMERGENCY DEPT Provider Note   CSN: [id number] Arrival date & time: 04/03/17  [id number]     History   Chief Complaint No chief complaint on file.   HPI Bryan Payne is a 22 y.o. male this here for evaluation of insect bite to the left groin for the last month. He removed a tick rom this area approximately one month ago.  He is not sure if it was a deer tick or not, he does not remember it being engorged. Since then this area has been itchy and he has been continuously scratching it. Has developed an open wound to the area that feels "bumpy". He was talking to a friend who told him he may have Lyme disease and he is concerned about this. Patient is homeless and has been living in the streets. He has not been sexually active in over a year and has never been tested for STDs, has no previous diagnosis of this. Denies urinary symptoms, penile discharge, testicular pain. No fevers, chills, chest pain, shortness of breath, cough, cold-like symptoms, abdominal pain  HPI  Past Medical History:  Diagnosis Date  . Knee derangement 04/2013   left  . Lateral meniscus tear 04/2013   left knee    Patient Active Problem List   Diagnosis Date Noted  . Alcohol abuse with alcohol-induced mood disorder (HCC) 02/17/2016  . Mushroom poisoning 12/22/2014  . Acute renal failure (HCC) 12/22/2014  . Marijuana abuse 12/22/2014  . Dehydration 12/22/2014  . Ingestion of unknown nonmedicinal substance 12/21/2014  . Rectal bleeding 12/21/2014  . Generalized abdominal pain 12/21/2014  . Lateral meniscus tear 05/05/2013  . Appendicitis, acute-gangrenous s/p laparoscopic appendectomy  10/13/2012    Past Surgical History:  Procedure Laterality Date  . APPENDECTOMY    . KNEE ARTHROSCOPY WITH LATERAL MENISECTOMY Left 05/05/2013   Procedure: LEFT KNEE ARTHROSCOPY WITH LATERAL MENISCAL REPAIR;  Surgeon: Eulas PostJoshua P Landau, MD;  Location: [facility];  Service:  Orthopedics;  Laterality: Left;  . LAPAROSCOPIC APPENDECTOMY Right 10/13/2012   Procedure: APPENDECTOMY LAPAROSCOPIC;  Surgeon: Mariella SaaBenjamin T Hoxworth, MD;  Location: [facility];  Service: General;  Laterality: Right;       Home Medications    Prior to Admission medications   Medication Sig Start Date End Date Taking? Authorizing Provider  acetaminophen (TYLENOL) 325 MG tablet Take 650 mg by mouth every 6 (six) hours as needed for moderate pain.    [provider]  Aspirin-Acetaminophen (GOODY BODY PAIN) 500-325 MG PACK Take 1 Package by mouth every 6 (six) hours as needed.    [provider]  doxycycline (VIBRAMYCIN) 100 MG capsule Take 1 capsule (100 mg total) by mouth 2 (two) times daily for 10 days. 04/03/17 04/13/17  Liberty HandyGibbons, Stehanie Ekstrom J, PA-C    Family History Family History  Problem Relation Age of Onset  . Kidney failure Father        Renal transplant  . Diabetes Other        Family on father's side    Social History Social History   Tobacco Use  . Smoking status: Current Every Day Smoker    Packs/day: 0.50    Types: Cigars, Cigarettes  . Smokeless tobacco: Never Used  Substance Use Topics  . Alcohol use: Yes    Comment: "often"  . Drug use: Yes    Types: Marijuana    Comment: daily     Allergies   Vicks vaporub [camph-eucalypt-men-turp-pet]   Review of Systems Review of  Systems  Skin: Positive for color change and wound.  All other systems reviewed and are negative.    Physical Exam Updated Vital Signs BP 125/74 (BP Location: Right Arm)   Pulse (!) 53   Temp 98.1 F (36.7 C) (Oral)   Resp 18   Ht 5\' 9"  (1.753 m)   Wt 59 kg (130 lb)   SpO2 99%   BMI 19.20 kg/m   Physical Exam  Constitutional: He is oriented to person, place, and time. He appears well-developed and well-nourished. No distress.  NAD. Poor hygiene.  HENT:  Head: Normocephalic and atraumatic.  Right Ear: External ear normal.  Left Ear: External ear normal.  Nose: Nose  normal.  Eyes: Conjunctivae and EOM are normal. No scleral icterus.  Neck: Normal range of motion.  Cardiovascular: Normal rate, regular rhythm, normal heart sounds and intact distal pulses.  No murmur heard. Pulmonary/Chest: Effort normal and breath sounds normal. He has no wheezes.  Abdominal: Soft. There is no tenderness.  No suprapubic or CVA tenderness. No guarding, rigidity, rebound.  Genitourinary:  Genitourinary Comments: External genitalia normal without erythema, edema, tenderness or lesions. No meatus discharge.  Glans and shaft smooth without tenderness, lesions, masses or deformity.  Scrotum without lesions or edema.  Non tender testicles. Epididymis and spermatic cord without tenderness or masses, bilaterally.  Musculoskeletal: Normal range of motion. He exhibits no deformity.  Lymphadenopathy:  There is nontender, bilateral inguinal lymphadenopathy approx 1-2 cm in diameter  Neurological: He is alert and oriented to person, place, and time.  Skin: Skin is warm and dry. Capillary refill takes less than 2 seconds. Lesion noted.  Erythematous flat lesion to left groin/upper thigh without tenderness, edema, fluctuance, warmth, drainage. There are 2-3 other hyperpigmented macules in this area as well.  Psychiatric: He has a normal mood and affect. His behavior is normal. Judgment and thought content normal.  Nursing note and vitals reviewed.    ED Treatments / Results  Labs (all labs ordered are listed, but only abnormal results are displayed) Labs Reviewed - No data to display  EKG  EKG Interpretation None       Radiology No results found.  Procedures Procedures (including critical care time)  Medications Ordered in ED Medications  doxycycline (VIBRA-TABS) tablet 100 mg (not administered)     Initial Impression / Assessment and Plan / ED Course  I have reviewed the triage vital signs and the nursing notes.  Pertinent labs & imaging results that were  available during my care of the patient were reviewed by me and considered in my medical decision making (see chart for details).    22 year old male with history of homelessness is here with concern of Lyme disease. Removed a tick from his left groin one month ago and has had persistent itching to this area and has developed a lesion. Based on my exam, lesion looks inflamed but not infected. Approximately 1 x 1 cm, nontender. Patient also has nearby hyperpigmented macules and I wonder if this is an acute on chronic problem. No signs of systemic infection.  No symptoms of Lyme disease.  He is requesting new socks and lotion, given. Discussed wound care. I gave him bacitracin and alcohol wipes and advised to keep irritated lesion clean to prevent infection.   Infectious Diseases Society of America (IDSA) guidelines do not recommend doxycyline prophylaxis at this time as it has been > 72 hours. However he has local borderline lymphadenopathy.  Given risk and local lymphadenopathy will tx with  doxycyline. Discussed return precautions that would warrant return to ED. Pt verbalized understanding. Gave him bag full of hygiene prodcuts before dc.   Final Clinical Impressions(s) / ED Diagnoses   Final diagnoses:  Insect bite, initial encounter    ED Discharge Orders        Ordered    doxycycline (VIBRAMYCIN) 100 MG capsule  2 times daily     04/03/17 0821       Liberty Handy, PA-C 04/03/17 1610    Abelino Derrick, MD 04/04/17 1601

## 2017-04-12 ENCOUNTER — Emergency Department (HOSPITAL_COMMUNITY): Payer: Self-pay

## 2017-04-12 ENCOUNTER — Emergency Department (HOSPITAL_COMMUNITY)
Admission: EM | Admit: 2017-04-12 | Discharge: 2017-04-12 | Disposition: A | Discharge status: Home / Self Care | Payer: Self-pay | Attending: Emergency Medicine | Admitting: Emergency Medicine

## 2017-04-12 ENCOUNTER — Encounter (HOSPITAL_COMMUNITY): Payer: Self-pay

## 2017-04-12 ENCOUNTER — Other Ambulatory Visit: Payer: Self-pay

## 2017-04-12 DIAGNOSIS — F17210 Nicotine dependence, cigarettes, uncomplicated: Secondary | ICD-10-CM | POA: Insufficient documentation

## 2017-04-12 DIAGNOSIS — S20319A Abrasion of unspecified front wall of thorax, initial encounter: Secondary | ICD-10-CM | POA: Insufficient documentation

## 2017-04-12 DIAGNOSIS — F1910 Other psychoactive substance abuse, uncomplicated: Secondary | ICD-10-CM

## 2017-04-12 DIAGNOSIS — S20419A Abrasion of unspecified back wall of thorax, initial encounter: Secondary | ICD-10-CM | POA: Insufficient documentation

## 2017-04-12 DIAGNOSIS — S01412A Laceration without foreign body of left cheek and temporomandibular area, initial encounter: Secondary | ICD-10-CM | POA: Insufficient documentation

## 2017-04-12 DIAGNOSIS — F1293 Cannabis use, unspecified with withdrawal: Secondary | ICD-10-CM | POA: Insufficient documentation

## 2017-04-12 DIAGNOSIS — Y929 Unspecified place or not applicable: Secondary | ICD-10-CM | POA: Insufficient documentation

## 2017-04-12 DIAGNOSIS — S0990XA Unspecified injury of head, initial encounter: Secondary | ICD-10-CM | POA: Insufficient documentation

## 2017-04-12 DIAGNOSIS — T07XXXA Unspecified multiple injuries, initial encounter: Secondary | ICD-10-CM

## 2017-04-12 DIAGNOSIS — S0181XA Laceration without foreign body of other part of head, initial encounter: Secondary | ICD-10-CM

## 2017-04-12 DIAGNOSIS — Y999 Unspecified external cause status: Secondary | ICD-10-CM | POA: Insufficient documentation

## 2017-04-12 DIAGNOSIS — T1490XA Injury, unspecified, initial encounter: Secondary | ICD-10-CM

## 2017-04-12 DIAGNOSIS — Y939 Activity, unspecified: Secondary | ICD-10-CM | POA: Insufficient documentation

## 2017-04-12 LAB — COMPREHENSIVE METABOLIC PANEL
ALBUMIN: 3.4 g/dL — AB (ref 3.5–5.0)
ALT: 16 U/L — AB (ref 17–63)
AST: 29 U/L (ref 15–41)
Alkaline Phosphatase: 25 U/L — ABNORMAL LOW (ref 38–126)
Anion gap: 8 (ref 5–15)
BILIRUBIN TOTAL: 0.7 mg/dL (ref 0.3–1.2)
BUN: 8 mg/dL (ref 6–20)
CO2: 24 mmol/L (ref 22–32)
Calcium: 8.4 mg/dL — ABNORMAL LOW (ref 8.9–10.3)
Chloride: 107 mmol/L (ref 101–111)
Creatinine, Ser: 1.21 mg/dL (ref 0.61–1.24)
GFR calc Af Amer: >60 mL/min (ref >60)
GFR calc non Af Amer: >60 mL/min (ref >60)
Glucose, Bld: 139 mg/dL — ABNORMAL HIGH (ref 65–99)
POTASSIUM: 3.2 mmol/L — AB (ref 3.5–5.1)
Sodium: 139 mmol/L (ref 135–145)
Total Protein: 5.4 g/dL — ABNORMAL LOW (ref 6.5–8.1)

## 2017-04-12 LAB — CBC
HEMATOCRIT: 41.1 % (ref 39.0–52.0)
Hemoglobin: 13.8 g/dL (ref 13.0–17.0)
MCH: 31.3 pg (ref 26.0–34.0)
MCHC: 33.6 g/dL (ref 30.0–36.0)
MCV: 93.2 fL (ref 78.0–100.0)
PLATELETS: 251 10*3/uL (ref 150–400)
RBC: 4.41 MIL/uL (ref 4.22–5.81)
RDW: 13 % (ref 11.5–15.5)
WBC: 10.7 10*3/uL — ABNORMAL HIGH (ref 4.0–10.5)

## 2017-04-12 LAB — RAPID URINE DRUG SCREEN, HOSP PERFORMED
Amphetamines: NOT DETECTED
Barbiturates: NOT DETECTED
Benzodiazepines: POSITIVE — AB
Cocaine: POSITIVE — AB
OPIATES: NOT DETECTED
TETRAHYDROCANNABINOL: POSITIVE — AB

## 2017-04-12 LAB — ETHANOL: Alcohol, Ethyl (B): <10 mg/dL (ref <10)

## 2017-04-12 MED ORDER — LIDOCAINE-EPINEPHRINE (PF) 2 %-1:200000 IJ SOLN
10.0000 mL | Freq: Once | INTRAMUSCULAR | Status: AC
  Administered 2017-04-12: 10 mL
  Filled 2017-04-12: qty 20
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
## 2017-04-12 NOTE — Discharge Instructions (Signed)
[facility] or see your private physician in 5-7 days for suture removal. Watch for signs of infection, such as redness, swelling, increased pain or drainage of pus.  Return to the ER or see your private physician if these occur.
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
## 2017-04-12 NOTE — ED Notes (Signed)
No respiratory or acute distress noted resting in bed with eyes closed call light in reach no reaction to medication noted. 

## 2017-04-12 NOTE — ED Notes (Signed)
No respiratory or acute distress noted resting in bed with eyes closed no reaction to medication noted [employer] with pt at bedside.

## 2017-04-19 ENCOUNTER — Ambulatory Visit (HOSPITAL_COMMUNITY)
Admission: EM | Admit: 2017-04-19 | Discharge: 2017-04-19 | Disposition: A | Discharge status: Home / Self Care | Payer: Self-pay

## 2017-04-19 DIAGNOSIS — Z4802 Encounter for removal of sutures: Secondary | ICD-10-CM

## 2017-04-19 DIAGNOSIS — S01402S Unspecified open wound of left cheek and temporomandibular area, sequela: Secondary | ICD-10-CM

## 2017-04-19 NOTE — ED Notes (Signed)
Pt here to have 3 sutures removed from L cheek. Wound is well healed, edges approximated. No signs of infection. 3 sutures removed. NO bleeding or drainage noted. Pt given bandaid and bacitracin. Given note. Pt had no further questions, denies pain.

## 2017-04-30 ENCOUNTER — Encounter (HOSPITAL_COMMUNITY): Payer: Self-pay

## 2017-04-30 ENCOUNTER — Emergency Department (HOSPITAL_COMMUNITY)
Admission: EM | Admit: 2017-04-30 | Discharge: 2017-05-01 | Disposition: A | Discharge status: Home / Self Care | Payer: Medicaid Other | Attending: Emergency Medicine | Admitting: Emergency Medicine

## 2017-04-30 DIAGNOSIS — F4329 Adjustment disorder with other symptoms: Secondary | ICD-10-CM | POA: Diagnosis present

## 2017-04-30 DIAGNOSIS — F17210 Nicotine dependence, cigarettes, uncomplicated: Secondary | ICD-10-CM | POA: Insufficient documentation

## 2017-04-30 DIAGNOSIS — F1219 Cannabis abuse with unspecified cannabis-induced disorder: Secondary | ICD-10-CM

## 2017-04-30 DIAGNOSIS — R45851 Suicidal ideations: Secondary | ICD-10-CM

## 2017-04-30 DIAGNOSIS — F1210 Cannabis abuse, uncomplicated: Secondary | ICD-10-CM | POA: Insufficient documentation

## 2017-04-30 LAB — CBC
HCT: 43.7 % (ref 39.0–52.0)
Hemoglobin: 15 g/dL (ref 13.0–17.0)
MCH: 31.6 pg (ref 26.0–34.0)
MCHC: 34.3 g/dL (ref 30.0–36.0)
MCV: 92.2 fL (ref 78.0–100.0)
PLATELETS: 271 10*3/uL (ref 150–400)
RBC: 4.74 MIL/uL (ref 4.22–5.81)
RDW: 12.6 % (ref 11.5–15.5)
WBC: 10.3 10*3/uL (ref 4.0–10.5)

## 2017-04-30 LAB — SALICYLATE LEVEL: Salicylate Lvl: <7 mg/dL (ref 2.8–30.0)

## 2017-04-30 LAB — COMPREHENSIVE METABOLIC PANEL
ALT: 38 U/L (ref 17–63)
AST: 47 U/L — AB (ref 15–41)
Albumin: 4.1 g/dL (ref 3.5–5.0)
Alkaline Phosphatase: 34 U/L — ABNORMAL LOW (ref 38–126)
Anion gap: 8 (ref 5–15)
BUN: 10 mg/dL (ref 6–20)
CO2: 27 mmol/L (ref 22–32)
CREATININE: 0.8 mg/dL (ref 0.61–1.24)
Calcium: 9.2 mg/dL (ref 8.9–10.3)
Chloride: 107 mmol/L (ref 101–111)
GFR calc Af Amer: >60 mL/min (ref >60)
Glucose, Bld: 96 mg/dL (ref 65–99)
POTASSIUM: 3.8 mmol/L (ref 3.5–5.1)
Sodium: 142 mmol/L (ref 135–145)
TOTAL PROTEIN: 6.6 g/dL (ref 6.5–8.1)
Total Bilirubin: 0.6 mg/dL (ref 0.3–1.2)

## 2017-04-30 LAB — URINALYSIS, ROUTINE W REFLEX MICROSCOPIC
Bilirubin Urine: NEGATIVE
Glucose, UA: NEGATIVE mg/dL
Hgb urine dipstick: NEGATIVE
Ketones, ur: NEGATIVE mg/dL
Leukocytes, UA: NEGATIVE
Nitrite: NEGATIVE
Protein, ur: NEGATIVE mg/dL
Specific Gravity, Urine: 1.01 (ref 1.005–1.030)
pH: 8 (ref 5.0–8.0)

## 2017-04-30 LAB — RAPID URINE DRUG SCREEN, HOSP PERFORMED
Amphetamines: NOT DETECTED
Barbiturates: NOT DETECTED
Benzodiazepines: NOT DETECTED
Cocaine: NOT DETECTED
Opiates: NOT DETECTED
Tetrahydrocannabinol: POSITIVE — AB

## 2017-04-30 LAB — ACETAMINOPHEN LEVEL: Acetaminophen (Tylenol), Serum: <10 ug/mL — ABNORMAL LOW (ref 10–30)

## 2017-04-30 LAB — ETHANOL

## 2017-04-30 NOTE — BH Assessment (Addendum)
Assessment Note  Bryan Payne is an 22 y.o. male, who presents voluntary and unaccompanied to [facility]. Pt was a poor historian and was prompted numerous times to engage in assessment. Clinician asked the pt, "what brought you to the hospital?" Pt reported, "I felt like I was going to hurt myself." Pt reported, he did not have a plan. Clinician observed the pt go back and forth, on being suicidal. Pt reported, he was suicidal and wanted to hurt himself earlier today but not currently. Pt reported, "I don't want to hurt or kill myself." Pt reported, he scratches himself. Per EDP/PA note, pt picks skin causing mild bleeding. Pt denies, SI, HI, AVH, and access to weapons.   Pt reported, he did not want to answer questions related to abuse. Pt reported, reported smoking marijuana this month and using CBD. Pt's UDS is positive for marijuana.   Clinician was unable to assess the following: "DSS involvement, being linked to OPT resources, legal involvement, family supports, sleep, appetite, vegetative symptoms, previous inpatient admissions, educational status, history of violence."   Pt presents sleeping in scrubs with slow, soft speech. Pt's mood was helpless. Pt's affect was flat. Pt's thought process was circumstantial. Pt's judgement was partial. Pt was oriented x4. Pt's concentration and insight are poor. Pt's impulse control was fair. Pt reported, if discharged from [facility] he could not contract for safety. Pt reported, if inpatient treatment was recommended he would sign-in voluntarily.   Diagnosis: F33.1 Major Depressive Disorder, recurrent, moderate without psychotic features.          F12.20 Cannabis use Disorder, moderate.  Past Medical History:  Past Medical History:  Diagnosis Date  . Knee derangement 04/2013   left  . Lateral meniscus tear 04/2013   left knee    Past Surgical History:  Procedure Laterality Date  . APPENDECTOMY    . KNEE ARTHROSCOPY WITH LATERAL MENISECTOMY Left 05/05/2013    Procedure: LEFT KNEE ARTHROSCOPY WITH LATERAL MENISCAL REPAIR;  Surgeon: Eulas Post, MD;  Location: [facility];  Service: Orthopedics;  Laterality: Left;  . LAPAROSCOPIC APPENDECTOMY Right 10/13/2012   Procedure: APPENDECTOMY LAPAROSCOPIC;  Surgeon: Mariella Saa, MD;  Location: [facility];  Service: General;  Laterality: Right;    Family History:  Family History  Problem Relation Age of Onset  . Kidney failure Father        Renal transplant  . Diabetes Other        Family on father's side    Social History:  reports that he has been smoking cigars and cigarettes.  He has been smoking about 0.50 packs per day. He has never used smokeless tobacco. He reports that he drinks alcohol. He reports that he has current or past drug history. Drug: Marijuana.  Additional Social History:  Alcohol / Drug Use Pain Medications: See MAR Prescriptions: See MAR  Over the Counter: See MAR History of alcohol / drug use?: Yes Substance #1 Name of Substance 1: Marijuana 1 - Age of First Use: UTA 1 - Amount (size/oz): UTA 1 - Frequency: UTA 1 - Duration: UTA 1 - Last Use / Amount: Sometime this month.  Substance #2 Name of Substance 2: CBD 2 - Age of First Use: UTA 2 - Amount (size/oz): Pt reported, today.  2 - Frequency: UTA 2 - Duration: UTA 2 - Last Use / Amount: UTA  CIWA: CIWA-Ar BP: 129/76 Pulse Rate: (!) 59 COWS:    Allergies:  Allergies  Allergen Reactions  . Vicks  Vaporub [Camph-Eucalypt-Men-Turp-Pet] Other (See Comments)    Caused eye irritation and shortness of breath    Home Medications:  (Not in a hospital admission)  OB/GYN Status:  No LMP for male patient.  General Assessment Data Location of Assessment: [facility] TTS Assessment: In system Is this a Tele or Face-to-Face Assessment?: Face-to-Face Is this an Initial Assessment or a Re-assessment for this encounter?: Initial Assessment Marital status: Single Living Arrangements: Other  (Comment)(Homeless. ) Can pt return to current living arrangement?: Yes Admission Status: Voluntary Is patient capable of signing voluntary admission?: Yes Referral Source: Self/Family/Friend Insurance type: Medicaid.     Crisis Care Plan Living Arrangements: Other (Comment)(Homeless. ) Legal Guardian: Other:(Self.) Name of Psychiatrist: UTA Name of Therapist: UTA  Education Status Is patient currently in school?: No Is the patient employed, unemployed or receiving disability?: Unemployed  Risk to self with the past 6 months Suicidal Ideation: No-Not Currently/Within Last 6 Months Has patient been a risk to self within the past 6 months prior to admission? : Yes Suicidal Intent: No Has patient had any suicidal intent within the past 6 months prior to admission? : No Is patient at risk for suicide?: No Suicidal Plan?: No Has patient had any suicidal plan within the past 6 months prior to admission? : No Access to Means: No What has been your use of drugs/alcohol within the last 12 months?: Marijuana.  Previous Attempts/Gestures: (UTA) How many times?: (UTA) Other Self Harm Risks: Pt scratches his skin.  Triggers for Past Attempts: None known Intentional Self Injurious Behavior: Damaging Comment - Self Injurious Behavior: Pt scratches his skin. Per EDP/PA note: pt picks skin causing mild bleeding. Family Suicide History: Unable to assess Recent stressful life event(s): Other (Comment)(Homelessnes. ) Persecutory voices/beliefs?: No Depression: Yes Depression Symptoms: Feeling worthless/self pity Substance abuse history and/or treatment for substance abuse?: Yes Suicide prevention information given to non-admitted patients: Not applicable  Risk to Others within the past 6 months Homicidal Ideation: No(Pt denies. ) Does patient have any lifetime risk of violence toward others beyond the six months prior to admission? : No Thoughts of Harm to Others: No Current Homicidal  Intent: No Current Homicidal Plan: No Access to Homicidal Means: No Identified Victim: NA History of harm to others?: (UTA) Assessment of Violence: (UTA) Violent Behavior Description: UTA Does patient have access to weapons?: No(Pt denies.) Criminal Charges Pending?: (UTA) Does patient have a court date: (UTA) Is patient on probation?: (UTA)  Psychosis Hallucinations: None noted Delusions: None noted  Mental Status Report Appearance/Hygiene: In scrubs Eye Contact: Poor Motor Activity: Unremarkable Speech: Slow, Soft Level of Consciousness: Sleeping Mood: Helpless Affect: Flat Anxiety Level: Minimal Thought Processes: Circumstantial Judgement: Partial Orientation: Person, Place, Time, Situation Obsessive Compulsive Thoughts/Behaviors: None  Cognitive Functioning Concentration: Poor Memory: Recent Impaired Is patient IDD: No Is patient DD?: No Insight: Poor Impulse Control: Fair Appetite: (UTA) Sleep: Unable to Assess  ADLScreening [facility] Assessment Services) Patient's cognitive ability adequate to safely complete daily activities?: Yes Patient able to express need for assistance with ADLs?: Yes Independently performs ADLs?: Yes (appropriate for developmental age)  Prior Inpatient Therapy Prior Inpatient Therapy: (UTA)  Prior Outpatient Therapy Prior Outpatient Therapy: (UTA)  ADL Screening (condition at time of admission) Patient's cognitive ability adequate to safely complete daily activities?: Yes Is the patient deaf or have difficulty hearing?: No Does the patient have difficulty seeing, even when wearing glasses/contacts?: Yes(Pt wears glasses. ) Does the patient have difficulty concentrating, remembering, or making decisions?: Yes Patient able to  express need for assistance with ADLs?: Yes Does the patient have difficulty dressing or bathing?: No Independently performs ADLs?: Yes (appropriate for developmental age) Does the patient have difficulty walking  or climbing stairs?: Yes(Pt reported, his legs and feet hurt. ) Weakness of Legs: Both(Pt reported, he legs and feet hurt. ) Weakness of Arms/Hands: None  Home Assistive Devices/Equipment Home Assistive Devices/Equipment: Eyeglasses    Abuse/Neglect Assessment (Assessment to be complete while patient is alone) Abuse/Neglect Assessment Can Be Completed: Unable to assess, patient is non-responsive or altered mental status(Pt reported, he did not want to answer the question. )     Advance Directives (For Healthcare) Does Patient Have a Medical Advance Directive?: No Would patient like information on creating a medical advance directive?: No - Patient declined    Additional Information 1:1 In Past 12 Months?: No CIRT Risk: No Elopement Risk: No Does patient have medical clearance?: Yes     Disposition: Jessica, PA recommends overnight observation for safety and stabilization. Disposition discussed with Joanie Coddington, RN.   Disposition Initial Assessment Completed for this Encounter: Yes Patient refused recommended treatment: No Mode of transportation if patient is discharged?: N/A Patient referred to: Other (Comment)( overnight observation for safety and stabilization.)  On Site Evaluation by:  Holly Bodily. Jedrek Dinovo, MS, LPC, CRC. Reviewed with Physician:  Shanda Bumps, [place] and Nira Conn, NP.  Redmond Pulling 04/30/2017 10:48 PM   Redmond Pulling, MS, [facility], [facility] Triage Specialist [phone]

## 2017-04-30 NOTE — Progress Notes (Signed)
Chart reviewed and patient evaluated via telepsych. Alert and oriented x 4, pleasant, and cooperative. Speech is clear and coherent. Mood is depressed.. Affect is congruent with mood. States "I was feeling like I might hurt myself, but was not suicidal." Denies having a plan to hurt himself. Denies current suicidal thoughts or thoughts of self harm. Denies homicidal ideations and audiovisual hallucinations. No indication that he is responding to internal stimuli.  General Appearance: Casual and Fairly Groomed  Eye Contact:  Good  Speech:  Clear and Coherent and Normal Rate  Volume:  Normal  Mood:  Depressed  Affect:  Congruent and Depressed  Orientation:  Full (Time, Place, and Person)  Thought Content:  Logical and Hallucinations: None  Suicidal Thoughts:  No  Homicidal Thoughts:  No  Judgement:  Good  Insight:  Fair  Psychomotor Activity:  Normal  Assets:  Communication Skills Desire for Improvement Leisure Time Physical Health Resilience  ADL's:  Intact     Disposition: No evidence of imminent risk to self or others at present.   Patient does not meet criteria for psychiatric inpatient admission. Supportive therapy provided about ongoing stressors. Discussed crisis plan, support from social network, calling 911, coming to the Emergency Department, and calling Suicide Hotline. TTS to provide with outpatient resources and shelter information.

## 2017-04-30 NOTE — ED Provider Notes (Signed)
[facility]-EMERGENCY DEPT Provider Note   CSN: [id number] Arrival date & time: 04/30/17  1854     History   Chief Complaint No chief complaint on file.   HPI Bryan Payne is a 22 y.o. male with no pertinent past medical history presenting with suicidal ideation and thoughts of harming himself.  He denies any hallucination or HI.  He reports concerns that his father will be upset with him because he forgot his backpack someone's house.  He has been picking at his skin causing mild superficial bleeding his lower extremities.  He admits to marijuana use no alcohol or other drugs of abuse.  He states that he walked from [facility] to here and his legs and feet are sore.   HPI  Past Medical History:  Diagnosis Date  . Knee derangement 04/2013   left  . Lateral meniscus tear 04/2013   left knee    Patient Active Problem List   Diagnosis Date Noted  . Alcohol abuse with alcohol-induced mood disorder (HCC) 02/17/2016  . Mushroom poisoning 12/22/2014  . Acute renal failure (HCC) 12/22/2014  . Marijuana abuse 12/22/2014  . Dehydration 12/22/2014  . Ingestion of unknown nonmedicinal substance 12/21/2014  . Rectal bleeding 12/21/2014  . Generalized abdominal pain 12/21/2014  . Lateral meniscus tear 05/05/2013  . Appendicitis, acute-gangrenous s/p laparoscopic appendectomy  10/13/2012    Past Surgical History:  Procedure Laterality Date  . APPENDECTOMY    . KNEE ARTHROSCOPY WITH LATERAL MENISECTOMY Left 05/05/2013   Procedure: LEFT KNEE ARTHROSCOPY WITH LATERAL MENISCAL REPAIR;  Surgeon: Eulas Post, MD;  Location: [facility];  Service: Orthopedics;  Laterality: Left;  . LAPAROSCOPIC APPENDECTOMY Right 10/13/2012   Procedure: APPENDECTOMY LAPAROSCOPIC;  Surgeon: Mariella Saa, MD;  Location: [facility];  Service: General;  Laterality: Right;        Home Medications    Prior to Admission medications   Medication Sig Start Date End  Date Taking? Authorizing Provider  ibuprofen (ADVIL,MOTRIN) 200 MG tablet Take 200 mg by mouth every 6 (six) hours as needed for moderate pain.   Yes [provider]    Family History Family History  Problem Relation Age of Onset  . Kidney failure Father        Renal transplant  . Diabetes Other        Family on father's side    Social History Social History   Tobacco Use  . Smoking status: Current Every Day Smoker    Packs/day: 0.50    Types: Cigars, Cigarettes  . Smokeless tobacco: Never Used  Substance Use Topics  . Alcohol use: Yes    Comment: "often"  . Drug use: Yes    Types: Marijuana    Comment: daily     Allergies   Vicks vaporub [camph-eucalypt-men-turp-pet]   Review of Systems Review of Systems  Constitutional: Negative for chills, diaphoresis, fatigue and fever.  HENT: Negative for congestion.   Eyes: Negative for photophobia and visual disturbance.  Respiratory: Negative for cough, choking, chest tightness, shortness of breath, wheezing and stridor.   Cardiovascular: Negative for chest pain, palpitations and leg swelling.  Gastrointestinal: Negative for abdominal distention, abdominal pain, blood in stool, diarrhea, nausea and vomiting.  Genitourinary: Negative for difficulty urinating and dysuria.  Musculoskeletal: Positive for myalgias. Negative for arthralgias, back pain, gait problem, joint swelling, neck pain and neck stiffness.  Skin: Positive for wound. Negative for color change and pallor.  Neurological: Negative for dizziness, tremors,  seizures, syncope, facial asymmetry, speech difficulty, weakness, light-headedness, numbness and headaches.  Psychiatric/Behavioral: Positive for self-injury and suicidal ideas. Negative for hallucinations.     Physical Exam Updated Vital Signs BP 129/76 (BP Location: Right Arm)   Pulse (!) 59   Temp 98.5 F (36.9 C) (Oral)   Resp 18   SpO2 100%   Physical Exam  Constitutional: He is oriented to  person, place, and time. He appears well-developed and well-nourished. No distress.  Afebrile, nontoxic-appearing, sitting comfortably in chair in no acute distress.  HENT:  Head: Normocephalic and atraumatic.  Eyes: Conjunctivae and EOM are normal. Right eye exhibits no discharge. Left eye exhibits no discharge.  Neck: Normal range of motion. Neck supple.  Cardiovascular: Normal rate, regular rhythm, normal heart sounds and intact distal pulses.  No murmur heard. Pulmonary/Chest: Effort normal and breath sounds normal. No stridor. No respiratory distress. He has no wheezes. He has no rales. He exhibits no tenderness.  Abdominal: Soft. He exhibits no distension and no mass. There is no tenderness. There is no rebound and no guarding.  Musculoskeletal: Normal range of motion. He exhibits no edema.  Neurological: He is alert and oriented to person, place, and time. No sensory deficit. He exhibits normal muscle tone.  Skin: Skin is warm and dry. No rash noted. He is not diaphoretic. No erythema. No pallor.  Psychiatric: He has a normal mood and affect.  Nursing note and vitals reviewed.    ED Treatments / Results  Labs (all labs ordered are listed, but only abnormal results are displayed) Labs Reviewed  COMPREHENSIVE METABOLIC PANEL  ETHANOL  SALICYLATE LEVEL  ACETAMINOPHEN LEVEL  CBC  RAPID URINE DRUG SCREEN, HOSP PERFORMED    EKG None  Radiology No results found.  Procedures Procedures (including critical care time)  Medications Ordered in ED Medications - No data to display   Initial Impression / Assessment and Plan / ED Course  I have reviewed the triage vital signs and the nursing notes.  Pertinent labs & imaging results that were available during my care of the patient were reviewed by me and considered in my medical decision making (see chart for details).    Patient has been scratching his skin on the anterior right lower extremity with superficial abrasion and  bleeding.  Bleeding controlled, area was cleaned and bandaged.  Lungs are clear to auscultation bilaterally.  Ordered medical clearance labs and TTS consult  Labs unremarkable except for positive cannabinoid.  TTS recommending discharge stating that he denies suicidal ideation or plan at this time and he reported suicidal ideations due to homelessness. I voiced my concerns regarding patient reliability and believe that he may benefit from observation overnight and re-evaluation in the am.  Patient will stay overnight and be re-evaluated in the morning.  Final Clinical Impressions(s) / ED Diagnoses   Final diagnoses:  Suicidal ideation    ED Discharge Orders    None       Gregary CromerMitchell, Aldrick Derrig B, PA-C 04/30/17 2245    Tegeler, Canary Brimhristopher J, MD 04/30/17 [phone]

## 2017-04-30 NOTE — ED Notes (Addendum)
Homeless pt presents with SI, no specific plan noted, reports he started feeling that way while walking to hospital.  Previous SI attempts in past, can't recall when.  Denies HI or AVH.  Feeling hopeless.  A&O x 3, no distress noted, calm & cooperative.  Pt admits to drinking occasional alcohol and CBD oil use.  Monitoring for safety, Q 15 min checks in effect.

## 2017-04-30 NOTE — ED Notes (Signed)
Bed: [facility] Expected date:  Expected time:  Means of arrival:  Comments: 

## 2017-05-01 ENCOUNTER — Other Ambulatory Visit: Payer: Self-pay

## 2017-05-01 ENCOUNTER — Emergency Department (HOSPITAL_COMMUNITY)
Admission: EM | Admit: 2017-05-01 | Discharge: 2017-05-02 | Disposition: A | Discharge status: Home / Self Care | Payer: Medicaid Other | Attending: Emergency Medicine | Admitting: Emergency Medicine

## 2017-05-01 DIAGNOSIS — Z5900 Homelessness unspecified: Secondary | ICD-10-CM

## 2017-05-01 DIAGNOSIS — F1290 Cannabis use, unspecified, uncomplicated: Secondary | ICD-10-CM

## 2017-05-01 DIAGNOSIS — F4329 Adjustment disorder with other symptoms: Secondary | ICD-10-CM | POA: Diagnosis present

## 2017-05-01 DIAGNOSIS — F329 Major depressive disorder, single episode, unspecified: Secondary | ICD-10-CM

## 2017-05-01 DIAGNOSIS — F32A Depression, unspecified: Secondary | ICD-10-CM

## 2017-05-01 DIAGNOSIS — F17210 Nicotine dependence, cigarettes, uncomplicated: Secondary | ICD-10-CM

## 2017-05-01 DIAGNOSIS — R45851 Suicidal ideations: Secondary | ICD-10-CM | POA: Insufficient documentation

## 2017-05-01 MED ORDER — IBUPROFEN 200 MG PO TABS
400.0000 mg | ORAL_TABLET | Freq: Once | ORAL | Status: AC
  Administered 2017-05-01: 400 mg via ORAL
  Filled 2017-05-01: qty 2

## 2017-05-01 MED ORDER — TRAZODONE HCL 50 MG PO TABS
50.0000 mg | ORAL_TABLET | Freq: Once | ORAL | Status: AC
Start: 2017-05-01 — End: 2017-05-01
  Administered 2017-05-01: 50 mg via ORAL
  Filled 2017-05-01: qty 1

## 2017-05-01 NOTE — ED Notes (Addendum)
Written dc instructions reviewed with Pt.  Pt encouraged to folow up with OP providers as directed and with [facility] for [facility].  Pt verbalized understanding. Bus pass given.  Pt ambulatory w/o difficulty to dc area with mHt, belongings returned after leaving the area.

## 2017-05-01 NOTE — ED Notes (Signed)
Up to the the bathroom 

## 2017-05-01 NOTE — ED Notes (Signed)
Pt stripped naked and masturbating in room.

## 2017-05-01 NOTE — ED Provider Notes (Signed)
[facility]-EMERGENCY DEPT Provider Note   CSN: [id number] Arrival date & time: 05/01/17  2229     History   Chief Complaint Chief Complaint  Patient presents with  . Depression    HPI Bryan Payne is a 22 y.o. male.  Patient complains of feeling depressed and being suicidal.  He was seen in the ED yesterday for the same complaints and discharged about 12 hours ago.  He states he feels depressed due to his recent homelessness and was not able to give plasma today and he was depressed about missing his appointment.  He states is not been eating and drinking well because he is homeless and being out in the cold.  He denies abusing any drugs or alcohol.  He is not prescribed any medications.  He denies any hallucinations or homicidal ideation. He states he misses his mother and has been thinking certain strangers might be her.  The history is provided by the patient.    Past Medical History:  Diagnosis Date  . Knee derangement 04/2013   left  . Lateral meniscus tear 04/2013   left knee    Patient Active Problem List   Diagnosis Date Noted  . Adjustment disorder with disturbance of emotion 05/01/2017  . Mushroom poisoning 12/22/2014  . Acute renal failure (HCC) 12/22/2014  . 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 Dehydration 12/22/2014  . Ingestion of unknown nonmedicinal substance 12/21/2014  . Rectal bleeding 12/21/2014  . Generalized abdominal pain 12/21/2014  . Lateral meniscus tear 05/05/2013  . Appendicitis, acute-gangrenous s/p laparoscopic appendectomy  10/13/2012    Past Surgical History:  Procedure Laterality Date  . APPENDECTOMY    . KNEE ARTHROSCOPY WITH LATERAL MENISECTOMY Left 05/05/2013   Procedure: LEFT KNEE ARTHROSCOPY WITH LATERAL MENISCAL REPAIR;  Surgeon: Eulas Post, MD;  Location: [facility];  Service: Orthopedics;  Laterality: Left;  . LAPAROSCOPIC APPENDECTOMY Right 10/13/2012   Procedure: APPENDECTOMY LAPAROSCOPIC;  Surgeon: Mariella Saa, MD;  Location: [facility];  Service: General;  Laterality: Right;        Home Medications    Prior to Admission medications   Medication Sig Start Date End Date Taking? Authorizing Provider  ibuprofen (ADVIL,MOTRIN) 200 MG tablet Take 200 mg by mouth every 6 (six) hours as needed for moderate pain.   Yes [provider]    Family History Family History  Problem Relation Age of Onset  . Kidney failure Father        Renal transplant  . Diabetes Other        Family on father's side    Social History Social History   Tobacco Use  . Smoking status: Current Every Day Smoker    Packs/day: 0.50    Types: Cigars, Cigarettes  . Smokeless tobacco: Never Used  Substance Use Topics  . Alcohol use: Yes    Comment: "often"  . Drug use: Yes    Types: Marijuana    Comment: daily     Allergies   Vicks vaporub [camph-eucalypt-men-turp-pet]   Review of Systems Review of Systems  Constitutional: Negative for activity change, appetite change and fever.  HENT: Negative for congestion and sinus pain.   Respiratory: Negative for cough, chest tightness and shortness of breath.   Cardiovascular: Negative for chest pain.  Gastrointestinal: Negative for abdominal pain, nausea and vomiting.  Genitourinary: Negative for dysuria, penile swelling and scrotal swelling.  Musculoskeletal: Negative for arthralgias and myalgias.  Neurological: Negative for dizziness,  weakness and headaches.  Psychiatric/Behavioral: Positive for confusion, hallucinations, self-injury and suicidal ideas. The patient is nervous/anxious.    all other systems are negative except as noted in the HPI and PMH.     Physical Exam Updated Vital Signs BP 129/78 (BP Location: Left Arm)   Pulse (!) 51   Temp 98 F (36.7 C) (Oral)   Resp 15   Wt 54.6 kg (120 lb 6.4 oz)   SpO2 99%   BMI 17.28 kg/m   Physical Exam  Constitutional: He is  oriented to person, place, and time. He appears well-developed and well-nourished. No distress.  Flat affect  HENT:  Head: Normocephalic and atraumatic.  Mouth/Throat: Oropharynx is clear and moist. No oropharyngeal exudate.  Eyes: Pupils are equal, round, and reactive to light. Conjunctivae and EOM are normal.  Neck: Normal range of motion. Neck supple.  No meningismus.  Cardiovascular: Normal rate, regular rhythm, normal heart sounds and intact distal pulses.  No murmur heard. Pulmonary/Chest: Effort normal and breath sounds normal. No respiratory distress. He exhibits no tenderness.  Abdominal: Soft. There is no tenderness. There is no rebound and no guarding.  Musculoskeletal: Normal range of motion. He exhibits no edema or tenderness.  Neurological: He is alert and oriented to person, place, and time. No cranial nerve deficit. He exhibits normal muscle tone. Coordination normal.  No ataxia on finger to nose bilaterally. No pronator drift. 5/5 strength throughout. CN 2-12 intact.Equal grip strength. Sensation intact.   Skin: Skin is warm. Capillary refill takes less than 2 seconds. No rash noted.  Psychiatric: He has a normal mood and affect. His behavior is normal.  Nursing note and vitals reviewed.    ED Treatments / Results  Labs (all labs ordered are listed, but only abnormal results are displayed) Labs Reviewed  COMPREHENSIVE METABOLIC PANEL - Abnormal; Notable for the following components:      Result Value   Total Protein 6.4 (*)    Alkaline Phosphatase 31 (*)    All other components within normal limits  ACETAMINOPHEN LEVEL - Abnormal; Notable for the following components:   Acetaminophen (Tylenol), Serum <10 (*)    All other components within normal limits  CBC WITH DIFFERENTIAL/PLATELET  ETHANOL  SALICYLATE LEVEL    EKG None  Radiology No results found.  Procedures Procedures (including critical care time)  Medications Ordered in ED Medications    ibuprofen (ADVIL,MOTRIN) tablet 400 mg (400 mg Oral Given 05/01/17 2354)     Initial Impression / Assessment and Plan / ED Course  I have reviewed the triage vital signs and the nursing notes.  Pertinent labs & imaging results that were available during my care of the patient were reviewed by me and considered in my medical decision making (see chart for details).    Homeless male presents with depression, fatigue, anorexia.  Seen for same and discharged less than 12 hours ago.  He is cleared by psychiatry.  He denies any suicidal homicidal thoughts currently but has had these thoughts in the past.  Patient seems to have some secondary gain as he is homeless and looking for a place to sleep.  His labs are reassuring. Patient's exam is benign.  Case discussed with TTS counselor Ala Dach.  Patient has no suicidal ideation, homicidal ideation, hallucinations at this time.  He does not meet inpatient treatment criteria. Patient given a list of outpatient resources for homeless shelters. Return precautions discussed Final Clinical Impressions(s) / ED Diagnoses   Final diagnoses:  Depression, unspecified  depression type  Homelessness    ED Discharge Orders    None       Rawn Quiroa, Jeannett SeniorStephen, MD 05/03/17 0300

## 2017-05-01 NOTE — [facility] Suicide Risk Assessment (Signed)
Suicide Risk Assessment  Discharge Assessment   [facility]   Principal Problem: Adjustment disorder with disturbance of emotion Discharge Diagnoses:  Patient Active Problem List   Diagnosis Date Noted  . Adjustment disorder with disturbance of emotion [F43.29] 05/01/2017    Priority: High  . Mushroom poisoning [T62.0X1A] 12/22/2014  . Acute renal failure (HCC) [N17.9] 12/22/2014  . Cannabis abuse with cannabis-induced disorder (HCC) [F12.19] 12/22/2014  . Dehydration [E86.0] 12/22/2014  . Ingestion of unknown nonmedicinal substance [T65.91XA] 12/21/2014  . Rectal bleeding [K62.5] 12/21/2014  . Generalized abdominal pain [R10.84] 12/21/2014  . Lateral meniscus tear [S83.289A] 05/05/2013  . Appendicitis, acute-gangrenous s/p laparoscopic appendectomy  [K35.80] 10/13/2012    Total Time spent with patient: 45 minutes  Musculoskeletal: Strength & Muscle Tone: within normal limits Gait & Station: normal Patient leans: N/A  Psychiatric Specialty Exam: Physical Exam  Constitutional: He is oriented to person, place, and time. He appears well-developed and well-nourished.  HENT:  Head: Normocephalic.  Neck: Normal range of motion.  Respiratory: Effort normal.  Musculoskeletal: Normal range of motion.  Neurological: He is alert and oriented to person, place, and time.  Psychiatric: He has a normal mood and affect. His speech is normal and behavior is normal. Judgment and thought content normal. Cognition and memory are normal.    Review of Systems  Psychiatric/Behavioral: Positive for substance abuse.  All other systems reviewed and are negative.   Blood pressure 124/65, pulse 76, temperature 98.2 F (36.8 C), temperature source Oral, resp. rate 18, SpO2 99 %.There is no height or weight on file to calculate BMI.  General Appearance: Casual  Eye Contact:  Good  Speech:  Normal Rate  Volume:  Normal  Mood:  Euthymic  Affect:  Congruent  Thought  Process:  Coherent and Descriptions of Associations: Intact  Orientation:  Full (Time, Place, and Person)  Thought Content:  WDL and Logical  Suicidal Thoughts:  No  Homicidal Thoughts:  No  Memory:  Immediate;   Good Recent;   Good Remote;   Good  Judgement:  Fair  Insight:  Fair  Psychomotor Activity:  Normal  Concentration:  Concentration: Good and Attention Span: Good  Recall:  Good  Fund of Knowledge:  Fair  Language:  Good  Akathisia:  No  Handed:  Right  AIMS (if indicated):     Assets:  Leisure Time Physical Health Resilience  ADL's:  Intact  Cognition:  WNL  Sleep:      Mental Status Per Nursing Assessment::   On Admission:   suicidal ideations  Demographic Factors:  Male and Adolescent or young adult  Loss Factors: NA  Historical Factors: NA  Risk Reduction Factors:   Sense of responsibility to family and Living with another person, especially a relative  Continued Clinical Symptoms:  None  Cognitive Features That Contribute To Risk:  None    Suicide Risk:  Minimal: No identifiable suicidal ideation.  Patients presenting with no risk factors but with morbid ruminations; may be classified as minimal risk based on the severity of the depressive symptoms    Plan Of Care/Follow-up recommendations:  Activity:  as tolerated Diet:  heart healthy diet  Shemaiah Round, NP 05/01/2017, 11:22 AM

## 2017-05-01 NOTE — ED Notes (Signed)
TTS into see 

## 2017-05-01 NOTE — ED Triage Notes (Addendum)
Pt is vague in description of complaints.  States he is seeing his mother but it's not her.  States he misses her but doesn't know where she is. Also that his father has offered for pt to stay with him but pt doesn't trust that.  Pt states he's sleep deprived.  Denies SI/HI.  States he was going to give plasma today to make some money but wasn't able to because he didn't get there on time and is depressed d/t that as well.  States he walked from the plasma donation center to here and has painful blisters on his feet as well as an itchy spot on his RT leg-"I think it's from being out in the cold"

## 2017-05-01 NOTE — ED Notes (Signed)
Up to the desk requesting that the Dr. Jarrett Ablesrders a sleep medication for tonight.  Pt reports that he did not sleep well

## 2017-05-01 NOTE — ED Notes (Signed)
Pt sleeping at present, no distress noted, calm & cooperative.  Denies SI at present.

## 2017-05-01 NOTE — ED Notes (Addendum)
Pt says he has been seeing visions of his mother, and not having much sleep. He is currently between homes. He said he recently spoke with his father who said he would be able to help him have a place to stay. He admits to self harm (punching metal signs). Denies HI or SI or auditory hallucinations.

## 2017-05-01 NOTE — Consult Note (Addendum)
[facility] Consult   Reason for Consult:  Suicidal ideations Referring Physician:  EDP Patient Identification: Bryan Payne MRN:  [id number] Principal Diagnosis: Adjustment disorder with disturbance of emotion Diagnosis:   Patient Active Problem List   Diagnosis Date Noted  . Adjustment disorder with disturbance of emotion [F43.29] 05/01/2017    Priority: High  . Mushroom poisoning [T62.0X1A] 12/22/2014  . Acute renal failure ([place]) [N17.9] 12/22/2014  . Cannabis abuse with cannabis-induced disorder ([place]) [F12.19] 12/22/2014  . Dehydration [E86.0] 12/22/2014  . Ingestion of unknown nonmedicinal substance [T65.91XA] 12/21/2014  . Rectal bleeding [K62.5] 12/21/2014  . Generalized abdominal pain [R10.84] 12/21/2014  . Lateral meniscus tear [S83.289A] 05/05/2013  . Appendicitis, acute-gangrenous s/p laparoscopic appendectomy  [K35.80] 10/13/2012    Total Time spent with patient: 45 minutes  Subjective:   Bryan Payne is a 22 y.o. male patient does not warrant admissin.  HPI:  22 yo male who presented to the ED with suicidal ideations.  Today, he denies suicidal ideations or past history.  His big issue is being homeless, requests shelter information, provided.  No suicidal/homicidal ideations,hallucinations, or withdrawal symptoms.  He does use cannabis occasionally but does not feel this is an issue.  Stable for discharge.  Past Psychiatric History: substance abuse  Risk to Self: NOne Risk to Others: Homicidal Ideation: No(Pt denies. ) Thoughts of Harm to Others: No Current Homicidal Intent: No Current Homicidal Plan: No Access to Homicidal Means: No Identified Victim: NA History of harm to others?: (UTA) Assessment of Violence: (UTA) Violent Behavior Description: UTA Does patient have access to weapons?: No(Pt denies.) Criminal Charges Pending?: (UTA) Does patient have a court date: (UTA) Prior Inpatient Therapy: Prior Inpatient Therapy: (UTA) Prior  Outpatient Therapy: Prior Outpatient Therapy: (UTA)  Past Medical History:  Past Medical History:  Diagnosis Date  . Knee derangement 04/2013   left  . Lateral meniscus tear 04/2013   left knee    Past Surgical History:  Procedure Laterality Date  . APPENDECTOMY    . KNEE ARTHROSCOPY WITH LATERAL MENISECTOMY Left 05/05/2013   Procedure: LEFT KNEE ARTHROSCOPY WITH LATERAL MENISCAL REPAIR;  Surgeon: Johnny Bridge, MD;  Location: [facility];  Service: Orthopedics;  Laterality: Left;  . LAPAROSCOPIC APPENDECTOMY Right 10/13/2012   Procedure: APPENDECTOMY LAPAROSCOPIC;  Surgeon: Edward Jolly, MD;  Location: [facility];  Service: General;  Laterality: Right;   Family History:  Family History  Problem Relation Age of Onset  . Kidney failure Father        Renal transplant  . Diabetes Other        Family on father's side   Family Psychiatric  History: none Social History:  Social History   Substance and Sexual Activity  Alcohol Use Yes   Comment: "often"     Social History   Substance and Sexual Activity  Drug Use Yes  . Types: Marijuana   Comment: daily    Social History   Socioeconomic History  . Marital status: Single    Spouse name: Not on file  . Number of children: Not on file  . Years of education: Not on file  . Highest education level: Not on file  Occupational History  . Not on file  Social Needs  . Financial resource strain: Not on file  . Food insecurity:    Worry: Not on file    Inability: Not on file  . Transportation needs:    Medical: Not on file    Non-medical:  Not on file  Tobacco Use  . Smoking status: Current Every Day Smoker    Packs/day: 0.50    Types: Cigars, Cigarettes  . Smokeless tobacco: Never Used  Substance and Sexual Activity  . Alcohol use: Yes    Comment: "often"  . Drug use: Yes    Types: Marijuana    Comment: daily  . Sexual activity: Never  Lifestyle  . Physical activity:    Days per week: Not on file     Minutes per session: Not on file  . Stress: Not on file  Relationships  . Social connections:    Talks on phone: Not on file    Gets together: Not on file    Attends religious service: Not on file    Active member of club or organization: Not on file    Attends meetings of clubs or organizations: Not on file    Relationship status: Not on file  Other Topics Concern  . Not on file  Social History Narrative  . Not on file   Additional Social History:    Allergies:   Allergies  Allergen Reactions  . Vicks Vaporub [Camph-Eucalypt-Men-Turp-Pet] Other (See Comments)    Caused eye irritation and shortness of breath    Labs:  Results for orders placed or performed during the hospital encounter of 04/30/17 (from the past 48 hour(s))  Comprehensive metabolic panel     Status: Abnormal   Collection Time: 04/30/17  8:08 PM  Result Value Ref Range   Sodium 142 135 - 145 mmol/L   Potassium 3.8 3.5 - 5.1 mmol/L   Chloride 107 101 - 111 mmol/L   CO2 27 22 - 32 mmol/L   Glucose, Bld 96 65 - 99 mg/dL   BUN 10 6 - 20 mg/dL   Creatinine, Ser 0.80 0.61 - 1.24 mg/dL   Calcium 9.2 8.9 - 10.3 mg/dL   Total Protein 6.6 6.5 - 8.1 g/dL   Albumin 4.1 3.5 - 5.0 g/dL   AST 47 (H) 15 - 41 U/L   ALT 38 17 - 63 U/L   Alkaline Phosphatase 34 (L) 38 - 126 U/L   Total Bilirubin 0.6 0.3 - 1.2 mg/dL   GFR calc non Af Amer >60 >60 mL/min   GFR calc Af Amer >60 >60 mL/min    Comment: (NOTE) The eGFR has been calculated using the CKD EPI equation. This calculation has not been validated in all clinical situations. eGFR's persistently <60 mL/min signify possible Chronic Kidney Disease.    Anion gap 8 5 - 15    Comment: Performed at [facility], [place] [address] [place], [place] [address]  Ethanol     Status: None   Collection Time: 04/30/17  8:08 PM  Result Value Ref Range   Alcohol, Ethyl (B) <10 <10 mg/dL    Comment:        LOWEST DETECTABLE LIMIT FOR SERUM ALCOHOL IS 10  mg/dL FOR MEDICAL PURPOSES ONLY Performed at [facility], [place] [address] [place], [place] [address]   Salicylate level     Status: None   Collection Time: 04/30/17  8:08 PM  Result Value Ref Range   Salicylate Lvl <0.2 2.8 - 30.0 mg/dL    Comment: Performed at [facility], [place] [address], [place], [place] [address]  Acetaminophen level     Status: Abnormal   Collection Time: 04/30/17  8:08 PM  Result Value Ref Range   Acetaminophen (Tylenol), Serum <10 (L)  10 - 30 ug/mL    Comment:        THERAPEUTIC CONCENTRATIONS VARY SIGNIFICANTLY. A RANGE OF 10-30 ug/mL MAY BE AN EFFECTIVE CONCENTRATION FOR MANY PATIENTS. HOWEVER, SOME ARE BEST TREATED AT CONCENTRATIONS OUTSIDE THIS RANGE. ACETAMINOPHEN CONCENTRATIONS >150 ug/mL AT 4 HOURS AFTER INGESTION AND >50 ug/mL AT 12 HOURS AFTER INGESTION ARE OFTEN ASSOCIATED WITH TOXIC REACTIONS. Performed at [facility], [place] [address] [place], [place] [address]   cbc     Status: None   Collection Time: 04/30/17  8:08 PM  Result Value Ref Range   WBC 10.3 4.0 - 10.5 K/uL   RBC 4.74 4.22 - 5.81 MIL/uL   Hemoglobin 15.0 13.0 - 17.0 g/dL   HCT 43.7 39.0 - 52.0 %   MCV 92.2 78.0 - 100.0 fL   MCH 31.6 26.0 - 34.0 pg   MCHC 34.3 30.0 - 36.0 g/dL   RDW 12.6 11.5 - 15.5 %   Platelets 271 150 - 400 K/uL    Comment: Performed at [facility], [place] [address] [place], [place] [address]  Rapid urine drug screen (hospital performed)     Status: Abnormal   Collection Time: 04/30/17  8:40 PM  Result Value Ref Range   Opiates NONE DETECTED NONE DETECTED   Cocaine NONE DETECTED NONE DETECTED   Benzodiazepines NONE DETECTED NONE DETECTED   Amphetamines NONE DETECTED NONE DETECTED   Tetrahydrocannabinol POSITIVE (A) NONE DETECTED   Barbiturates NONE DETECTED NONE DETECTED    Comment: (NOTE) DRUG SCREEN FOR MEDICAL PURPOSES ONLY.  IF CONFIRMATION IS NEEDED FOR ANY PURPOSE,  NOTIFY LAB WITHIN 5 DAYS. LOWEST DETECTABLE LIMITS FOR URINE DRUG SCREEN Drug Class                     Cutoff (ng/mL) Amphetamine and metabolites    1000 Barbiturate and metabolites    200 Benzodiazepine                 211 Tricyclics and metabolites     300 Opiates and metabolites        300 Cocaine and metabolites        300 THC                            50 Performed at [facility], [place] [address] [place], [place] [address]   Urinalysis, Routine w reflex microscopic     Status: Abnormal   Collection Time: 04/30/17  8:40 PM  Result Value Ref Range   Color, Urine STRAW (A) YELLOW   APPearance CLEAR CLEAR   Specific Gravity, Urine 1.010 1.005 - 1.030   pH 8.0 5.0 - 8.0   Glucose, UA NEGATIVE NEGATIVE mg/dL   Hgb urine dipstick NEGATIVE NEGATIVE   Bilirubin Urine NEGATIVE NEGATIVE   Ketones, ur NEGATIVE NEGATIVE mg/dL   Protein, ur NEGATIVE NEGATIVE mg/dL   Nitrite NEGATIVE NEGATIVE   Leukocytes, UA NEGATIVE NEGATIVE    Comment: Performed at [place] [address] [place],  [address]    No current facility-administered medications for this encounter.    Current Outpatient Medications  Medication Sig Dispense Refill  . ibuprofen (ADVIL,MOTRIN) 200 MG tablet Take 200 mg by mouth every 6 (six) hours as needed for moderate pain.      Musculoskeletal: Strength & Muscle Tone: within normal limits Gait & Station: normal Patient leans: N/A  Psychiatric Specialty Exam: Physical  Exam  Constitutional: He is oriented to person, place, and time. He appears well-developed and well-nourished.  HENT:  Head: Normocephalic.  Neck: Normal range of motion.  Respiratory: Effort normal.  Musculoskeletal: Normal range of motion.  Neurological: He is alert and oriented to person, place, and time.  Psychiatric: He has a normal mood and affect. His speech is normal and behavior is normal. Judgment and thought content normal. Cognition  and memory are normal.    Review of Systems  Psychiatric/Behavioral: Positive for substance abuse.  All other systems reviewed and are negative.   Blood pressure 124/65, pulse 76, temperature 98.2 F (36.8 C), temperature source Oral, resp. rate 18, SpO2 99 %.There is no height or weight on file to calculate BMI.  General Appearance: Casual  Eye Contact:  Good  Speech:  Normal Rate  Volume:  Normal  Mood:  Euthymic  Affect:  Congruent  Thought Process:  Coherent and Descriptions of Associations: Intact  Orientation:  Full (Time, Place, and Person)  Thought Content:  WDL and Logical  Suicidal Thoughts:  No  Homicidal Thoughts:  No  Memory:  Immediate;   Good Recent;   Good Remote;   Good  Judgement:  Fair  Insight:  Fair  Psychomotor Activity:  Normal  Concentration:  Concentration: Good and Attention Span: Good  Recall:  Good  Fund of Knowledge:  Fair  Language:  Good  Akathisia:  No  Handed:  Right  AIMS (if indicated):     Assets:  Leisure Time Physical Health Resilience  ADL's:  Intact  Cognition:  WNL  Sleep:        Treatment Plan Summary: Daily contact with patient to assess and evaluate symptoms and progress in treatment, Medication management and Plan adjustment disorder with disturbance of emotions:  -Crisis stabilization -Medication management:  None started, not needed -Individual counseling  Disposition: No evidence of imminent risk to self or others at present.    Waylan Boga, NP 05/01/2017 11:11 AM  Patient seen face-to-face for psychiatric evaluation, chart reviewed and case discussed with the physician extender and developed treatment plan. Reviewed the information documented and agree with the treatment plan. Corena Pilgrim, MD

## 2017-05-01 NOTE — BH Assessment (Signed)
[facility] Assessment Progress Note    Per Dr. Jannifer FranklinAkintayo, patient can be discharged with outpatient resources that were provided for him after he meets with Peer Support.

## 2017-05-01 NOTE — ED Notes (Signed)
Dr Lenore CordiaAkintyo and Catha NottinghamJamison DNP into see.  Pt denies si/hi/avh at this time.  Pt reports that he is homeless, does not work "too tired" and that he "don't really want to walk anywhere."  Pt denies drug use other than occasional pot.  Pt also reports that he has family locally, but has diffculty talking to them.  Pt reports that he would like to get help talking with people.

## 2017-05-02 LAB — CBC WITH DIFFERENTIAL/PLATELET
BASOS ABS: 0.1 10*3/uL (ref 0.0–0.1)
BASOS PCT: 1 %
EOS ABS: 0.1 10*3/uL (ref 0.0–0.7)
EOS PCT: 1 %
HEMATOCRIT: 44.4 % (ref 39.0–52.0)
Hemoglobin: 14.8 g/dL (ref 13.0–17.0)
Lymphocytes Relative: 30 %
Lymphs Abs: 3.2 10*3/uL (ref 0.7–4.0)
MCH: 31.1 pg (ref 26.0–34.0)
MCHC: 33.3 g/dL (ref 30.0–36.0)
MCV: 93.3 fL (ref 78.0–100.0)
MONO ABS: 0.4 10*3/uL (ref 0.1–1.0)
Monocytes Relative: 4 %
Neutro Abs: 6.7 10*3/uL (ref 1.7–7.7)
Neutrophils Relative %: 64 %
PLATELETS: 277 10*3/uL (ref 150–400)
RBC: 4.76 MIL/uL (ref 4.22–5.81)
RDW: 12.4 % (ref 11.5–15.5)
WBC: 10.5 10*3/uL (ref 4.0–10.5)

## 2017-05-02 LAB — COMPREHENSIVE METABOLIC PANEL
ALBUMIN: 3.9 g/dL (ref 3.5–5.0)
ALT: 30 U/L (ref 17–63)
ANION GAP: 9 (ref 5–15)
AST: 29 U/L (ref 15–41)
Alkaline Phosphatase: 31 U/L — ABNORMAL LOW (ref 38–126)
BILIRUBIN TOTAL: 0.5 mg/dL (ref 0.3–1.2)
BUN: 17 mg/dL (ref 6–20)
CHLORIDE: 104 mmol/L (ref 101–111)
CO2: 25 mmol/L (ref 22–32)
Calcium: 9 mg/dL (ref 8.9–10.3)
Creatinine, Ser: 0.79 mg/dL (ref 0.61–1.24)
GFR calc Af Amer: >60 mL/min (ref >60)
GFR calc non Af Amer: >60 mL/min (ref >60)
GLUCOSE: 96 mg/dL (ref 65–99)
POTASSIUM: 3.7 mmol/L (ref 3.5–5.1)
SODIUM: 138 mmol/L (ref 135–145)
TOTAL PROTEIN: 6.4 g/dL — AB (ref 6.5–8.1)

## 2017-05-02 LAB — SALICYLATE LEVEL: Salicylate Lvl: <7 mg/dL (ref 2.8–30.0)

## 2017-05-02 LAB — ETHANOL: Alcohol, Ethyl (B): <10 mg/dL (ref <10)

## 2017-05-02 LAB — ACETAMINOPHEN LEVEL

## 2017-05-02 NOTE — BH Assessment (Addendum)
Tele Assessment Note   Patient Name: Bryan Payne MRN: [id number] Referring Physician: Glynn Octave, MD Location of Patient: [facility] Location of Provider: Behavioral Health TTS Department  Bryan Payne is an 22 y.o. single male who presents unaccompanied to [facility] ED reporting symptoms of depression. Pt was seen in ED yesterday for similar symptoms and discharged less than 24 hour ago. Pt states he feels very depressed because he is homeless and he misses his mother. Pt told triage RN he was going to give plasma today to make some money but wasn't able to because he didn't get there on time and is depressed due to that as well. He says he is "sleep deprived". He states has not been eating because he doesn't have money for food. Pt denies current suicidal ideation or any history of suicide attempts. He denies current homicidal ideation or history of violence. Pt denies current auditory or visual hallucinations. He says because he is sleep deprived he mistakenly thinks certain women on the street are his mother but they are not. Pt states he uses marijuana once every 2-3 months. He denies using other substances or abusing alcohol.  Pt identifies homelessness and lack of support as his primary stressors. He says his mother doesn't have a place for him to stay. He is unemployed and has no Architect. He cannot identify anyone in his life who is supportive. He reports he has a court date 05/20/17 because he was charged with trespassing. He denies any history of abuse. He denies any history of inpatient psychiatric treatment.  Pt is covered in a blanket, drowsy and oriented x4. Pt speaks in a soft tone, at low volume and slow pace. Motor behavior appears normal. Eye contact is poor. Pt's mood is depressed and helpless; affect is blunted. Thought process is coherent and relevant. There is no indication Pt is currently responding to internal stimuli or experiencing delusional  thought content. Pt was cooperative throughout assessment. When asked what type of help Pt believes he needs at this time, Pt says he needs to sleep.  See recent assessment by psychiatry on 05/01/17 for additional clinical information.   Diagnosis:  F33.1 Major Depressive Disorder, recurrent, moderate without psychotic features.  F12.20 Cannabis use Disorder, moderate.  Past Medical History:  Past Medical History:  Diagnosis Date  . Knee derangement 04/2013   left  . Lateral meniscus tear 04/2013   left knee    Past Surgical History:  Procedure Laterality Date  . APPENDECTOMY    . KNEE ARTHROSCOPY WITH LATERAL MENISECTOMY Left 05/05/2013   Procedure: LEFT KNEE ARTHROSCOPY WITH LATERAL MENISCAL REPAIR;  Surgeon: Eulas Post, MD;  Location: [facility];  Service: Orthopedics;  Laterality: Left;  . LAPAROSCOPIC APPENDECTOMY Right 10/13/2012   Procedure: APPENDECTOMY LAPAROSCOPIC;  Surgeon: Mariella Saa, MD;  Location: [facility];  Service: General;  Laterality: Right;    Family History:  Family History  Problem Relation Age of Onset  . Kidney failure Father        Renal transplant  . Diabetes Other        Family on father's side    Social History:  reports that he has been smoking cigars and cigarettes.  He has been smoking about 0.50 packs per day. He has never used smokeless tobacco. He reports that he drinks alcohol. He reports that he has current or past drug history. Drug: Marijuana.  Additional Social History:  Alcohol / Drug Use Pain Medications:  See MAR Prescriptions: See MAR  Over the Counter: See MAR History of alcohol / drug use?: Yes Longest period of sobriety (when/how long): Unknown Negative Consequences of Use: (Pt denies) Withdrawal Symptoms: (Pt denies) Substance #1 Name of Substance 1: Marijuana 1 - Age of First Use: UTA 1 - Amount (size/oz): UTA 1 - Frequency: UTA 1 - Duration: UTA 1 - Last Use / Amount: Sometime this month.   Substance #2 Name of Substance 2: CBD 2 - Age of First Use: UTA 2 - Amount (size/oz): Pt reported, today.  2 - Frequency: UTA 2 - Duration: UTA 2 - Last Use / Amount: UTA  CIWA: CIWA-Ar BP: 129/78 Pulse Rate: (!) 51 COWS:    Allergies:  Allergies  Allergen Reactions  . Vicks Vaporub [Camph-Eucalypt-Men-Turp-Pet] Other (See Comments)    Caused eye irritation and shortness of breath    Home Medications:  (Not in a hospital admission)  OB/GYN Status:  No LMP for male patient.  General Assessment Data Location of Assessment: [facility] TTS Assessment: In system Is this a Tele or Face-to-Face Assessment?: Tele Assessment Is this an Initial Assessment or a Re-assessment for this encounter?: Initial Assessment Marital status: Single Maiden name: NA Is patient pregnant?: No Pregnancy Status: No Living Arrangements: Other (Comment)(Homeless) Can pt return to current living arrangement?: Yes Admission Status: Voluntary Is patient capable of signing voluntary admission?: Yes Referral Source: Self/Family/Friend Insurance type: Medicaid     Crisis Care Plan Living Arrangements: Other (Comment)(Homeless) Legal Guardian: Other:(Self) Name of Psychiatrist: None Name of Therapist: None  Education Status Is patient currently in school?: No Is the patient employed, unemployed or receiving disability?: Unemployed  Risk to self with the past 6 months Suicidal Ideation: No Has patient been a risk to self within the past 6 months prior to admission? : Yes Suicidal Intent: No Has patient had any suicidal intent within the past 6 months prior to admission? : No Is patient at risk for suicide?: No Suicidal Plan?: No Has patient had any suicidal plan within the past 6 months prior to admission? : No Access to Means: No What has been your use of drugs/alcohol within the last 12 months?: Pt reports infrequent marijuana use Previous Attempts/Gestures: No How many times?: 0 Other Self  Harm Risks: Pt scratches his skin Triggers for Past Attempts: None known Intentional Self Injurious Behavior: Damaging Comment - Self Injurious Behavior: Pt scratches his skin. Per EDP/PA note: pt picks skin causing mild bleeding. Family Suicide History: Unable to assess Recent stressful life event(s): Other (Comment)(Homeless, poor support) Persecutory voices/beliefs?: No Depression: Yes Depression Symptoms: Despondent, Insomnia, Fatigue, Feeling worthless/self pity Substance abuse history and/or treatment for substance abuse?: Yes Suicide prevention information given to non-admitted patients: Not applicable  Risk to Others within the past 6 months Homicidal Ideation: No Does patient have any lifetime risk of violence toward others beyond the six months prior to admission? : No Thoughts of Harm to Others: No Current Homicidal Intent: No Current Homicidal Plan: No Access to Homicidal Means: No Identified Victim: None History of harm to others?: No Assessment of Violence: None Noted Violent Behavior Description: Pt denies history of violence Does patient have access to weapons?: No Criminal Charges Pending?: Yes Describe Pending Criminal Charges: Trespassing Does patient have a court date: Yes Court Date: 05/20/17 Is patient on probation?: No  Psychosis Hallucinations: None noted Delusions: None noted  Mental Status Report Appearance/Hygiene: Other (Comment)(Casually dressed) Eye Contact: Poor Motor Activity: Unremarkable, Freedom of movement Speech: Slow, Soft  Level of Consciousness: Drowsy Mood: Helpless Affect: Blunted Anxiety Level: None Thought Processes: Coherent Judgement: Partial Orientation: Person, Place, Time, Situation Obsessive Compulsive Thoughts/Behaviors: None  Cognitive Functioning Concentration: Fair Memory: Recent Intact, Remote Intact Is patient IDD: No Is patient DD?: No Insight: Poor Impulse Control: Fair Appetite: Poor Have you had any  weight changes? : Loss Amount of the weight change? (lbs): 5 lbs Sleep: Decreased Total Hours of Sleep: 3 Vegetative Symptoms: None  ADLScreening [facility] Assessment Services) Patient's cognitive ability adequate to safely complete daily activities?: Yes Patient able to express need for assistance with ADLs?: Yes Independently performs ADLs?: Yes (appropriate for developmental age)  Prior Inpatient Therapy Prior Inpatient Therapy: No  Prior Outpatient Therapy Prior Outpatient Therapy: No Does patient have an ACCT team?: No Does patient have Intensive In-House Services?  : No Does patient have [facility] services? : No Does patient have P4CC services?: No  ADL Screening (condition at time of admission) Patient's cognitive ability adequate to safely complete daily activities?: Yes Is the patient deaf or have difficulty hearing?: No Does the patient have difficulty seeing, even when wearing glasses/contacts?: No Does the patient have difficulty concentrating, remembering, or making decisions?: No Patient able to express need for assistance with ADLs?: Yes Does the patient have difficulty dressing or bathing?: No Independently performs ADLs?: Yes (appropriate for developmental age) Does the patient have difficulty walking or climbing stairs?: No Weakness of Legs: None Weakness of Arms/Hands: None       Abuse/Neglect Assessment (Assessment to be complete while patient is alone) Abuse/Neglect Assessment Can Be Completed: Yes Physical Abuse: Denies Verbal Abuse: Denies Sexual Abuse: Denies Exploitation of patient/patient's resources: Denies Self-Neglect: Denies     Merchant navy officer (For Healthcare) Does Patient Have a Medical Advance Directive?: No Would patient like information on creating a medical advance directive?: No - Patient declined          Disposition: Gave clinical report to Nira Conn, NP who said Pt does not meet criteria for inpatient psychiatric treatment  and recommends Pt be referred to outpatient treatment. Notified Dr. Glynn Octave and Victorino Dike, RN of recommendation.   Disposition Initial Assessment Completed for this Encounter: Yes Patient referred to: Outpatient clinic referral  This service was provided via telemedicine using a 2-way, interactive audio and video technology.  Names of all persons participating in this telemedicine service and their role in this encounter. Name: Elayne Snare Role: Patient  Name: Shela Commons, [place] Role: TTS counselor         Harlin Rain Patsy [place], [facility], [facility], [facility] Triage Specialist [phone]  Pamalee Leyden 05/02/2017 3:13 AM

## 2017-05-05 ENCOUNTER — Emergency Department (HOSPITAL_COMMUNITY)
Admission: EM | Admit: 2017-05-05 | Discharge: 2017-05-05 | Disposition: A | Discharge status: Home / Self Care | Payer: Medicaid Other | Attending: Emergency Medicine | Admitting: Emergency Medicine

## 2017-05-05 ENCOUNTER — Encounter (HOSPITAL_COMMUNITY): Payer: Self-pay

## 2017-05-05 ENCOUNTER — Other Ambulatory Visit: Payer: Self-pay

## 2017-05-05 DIAGNOSIS — Z5321 Procedure and treatment not carried out due to patient leaving prior to being seen by health care provider: Secondary | ICD-10-CM | POA: Insufficient documentation

## 2017-05-05 DIAGNOSIS — F1220 Cannabis dependence, uncomplicated: Secondary | ICD-10-CM | POA: Insufficient documentation

## 2017-05-05 DIAGNOSIS — F329 Major depressive disorder, single episode, unspecified: Secondary | ICD-10-CM

## 2017-05-05 DIAGNOSIS — Z5900 Homelessness unspecified: Secondary | ICD-10-CM

## 2017-05-05 DIAGNOSIS — F331 Major depressive disorder, recurrent, moderate: Secondary | ICD-10-CM | POA: Insufficient documentation

## 2017-05-05 DIAGNOSIS — F17210 Nicotine dependence, cigarettes, uncomplicated: Secondary | ICD-10-CM | POA: Insufficient documentation

## 2017-05-05 DIAGNOSIS — F32A Depression, unspecified: Secondary | ICD-10-CM

## 2017-05-05 DIAGNOSIS — R1110 Vomiting, unspecified: Secondary | ICD-10-CM | POA: Insufficient documentation

## 2017-05-05 LAB — CBC WITH DIFFERENTIAL/PLATELET
BASOS ABS: 0.1 10*3/uL (ref 0.0–0.1)
BASOS PCT: 1 %
EOS PCT: 4 %
Eosinophils Absolute: 0.3 10*3/uL (ref 0.0–0.7)
HEMATOCRIT: 42.4 % (ref 39.0–52.0)
Hemoglobin: 14.3 g/dL (ref 13.0–17.0)
LYMPHS PCT: 41 %
Lymphs Abs: 3.2 10*3/uL (ref 0.7–4.0)
MCH: 31.2 pg (ref 26.0–34.0)
MCHC: 33.7 g/dL (ref 30.0–36.0)
MCV: 92.4 fL (ref 78.0–100.0)
Monocytes Absolute: 0.5 10*3/uL (ref 0.1–1.0)
Monocytes Relative: 7 %
NEUTROS ABS: 3.8 10*3/uL (ref 1.7–7.7)
Neutrophils Relative %: 47 %
PLATELETS: 259 10*3/uL (ref 150–400)
RBC: 4.59 MIL/uL (ref 4.22–5.81)
RDW: 12.4 % (ref 11.5–15.5)
WBC: 7.9 10*3/uL (ref 4.0–10.5)

## 2017-05-05 LAB — COMPREHENSIVE METABOLIC PANEL
ALT: 20 U/L (ref 17–63)
ANION GAP: 8 (ref 5–15)
AST: 19 U/L (ref 15–41)
Albumin: 3.7 g/dL (ref 3.5–5.0)
Alkaline Phosphatase: 32 U/L — ABNORMAL LOW (ref 38–126)
BUN: 17 mg/dL (ref 6–20)
CO2: 27 mmol/L (ref 22–32)
Calcium: 8.7 mg/dL — ABNORMAL LOW (ref 8.9–10.3)
Chloride: 107 mmol/L (ref 101–111)
Creatinine, Ser: 1.04 mg/dL (ref 0.61–1.24)
Glucose, Bld: 97 mg/dL (ref 65–99)
POTASSIUM: 3.7 mmol/L (ref 3.5–5.1)
Sodium: 142 mmol/L (ref 135–145)
Total Bilirubin: 0.8 mg/dL (ref 0.3–1.2)
Total Protein: 6.1 g/dL — ABNORMAL LOW (ref 6.5–8.1)

## 2017-05-05 LAB — RAPID URINE DRUG SCREEN, HOSP PERFORMED
Amphetamines: NOT DETECTED
BARBITURATES: NOT DETECTED
Benzodiazepines: NOT DETECTED
COCAINE: NOT DETECTED
Opiates: NOT DETECTED
TETRAHYDROCANNABINOL: POSITIVE — AB

## 2017-05-05 NOTE — ED Triage Notes (Signed)
Pt stated "I'm homeless and when I was here last the;y told me to come back here in 2 weeks for f/u."

## 2017-05-05 NOTE — ED Notes (Signed)
Pt. Very cooperative upon arrival. Pts. Being accessed by nurse.

## 2017-05-05 NOTE — Progress Notes (Signed)
CSW met with pt to offer resources and as session progressed, pt accepted meeting schedules for area Alcoholics Anonymous and Narcotics Anonymous 12-Step meetings as well as inpatient/outpatient SA Tx resources.  CSW provided education to the pt as to the efficacy of 12-step programs for community support for those needing support in addition to or other than inpatient/outpatient treatment and provided pt with list of [facility] in [place] and educated the pt on making contact, assessing for open beds and requesting an interview/assessment for admission. Pt appreciated CSW's efforts and thanked the CSW.  Pt was using the phone as the CSW left to contact [facility].  Please reconsult if future social work needs arise.  CSW signing off, as social work intervention is no longer needed.   F. , LCSW, LCAS, CSI Clinical Social Worker Ph: [phone]         

## 2017-05-05 NOTE — BH Assessment (Signed)
Assessment Note  Bryan Payne is an 22 y.o. male.  -Clinician reviewed note by Dietrich Pates, PA.  Bryan Payne is a 22 y.o. male who presents to ED for evaluation of worsening depression.  He is homeless and states that his depression is worsening based on that situation.  He denies any SI, HI, AVH.  He reports decrease in p.o. Intake due to his homelessness as well.  He has never been on any medications for depression in the past.  Denies any medication ingestions, access to firearms.  Denies any other symptoms at this time  Pt had a TTS assessment on 05/02/17 and was discharged later that day.  Patient today informs this clinician that things have improved since his last assessment.  He said he got his state identification card and his Social security card.  He reports that he has seen his mother over the last few days.  Patient had not seen her in a long time prior.  Patient is currently denying any SI, HI or A/V hallucinations.  Patient denies any depressive symptoms to this clinician.  When asked what he may do in the next day or so regarding his homelessness, he says he will contact [facility].  Patient was given information on [facility] [employer] by CSW [employer].  -Clinician discussed patient care with Donell Sievert, PA who said patient did not meet inpatient care criteria.  Clinician talked with Dietrich Pates, PA and let her know that patient did not meet inpatient care criteria.  Diagnosis: F33.1 MDD recurrent, moderate: F12.20 Cannabis use d/o moderate  Past Medical History:  Past Medical History:  Diagnosis Date  . Knee derangement 04/2013   left  . Lateral meniscus tear 04/2013   left knee    Past Surgical History:  Procedure Laterality Date  . APPENDECTOMY    . KNEE ARTHROSCOPY WITH LATERAL MENISECTOMY Left 05/05/2013   Procedure: LEFT KNEE ARTHROSCOPY WITH LATERAL MENISCAL REPAIR;  Surgeon: Eulas Post, MD;  Location: [facility];  Service:  Orthopedics;  Laterality: Left;  . LAPAROSCOPIC APPENDECTOMY Right 10/13/2012   Procedure: APPENDECTOMY LAPAROSCOPIC;  Surgeon: Mariella Saa, MD;  Location: [facility];  Service: General;  Laterality: Right;    Family History:  Family History  Problem Relation Age of Onset  . Kidney failure Father        Renal transplant  . Diabetes Other        Family on father's side    Social History:  reports that he has been smoking cigars and cigarettes.  He has been smoking about 0.50 packs per day. He has never used smokeless tobacco. He reports that he drinks alcohol. He reports that he has current or past drug history. Drug: Marijuana.  Additional Social History:  Alcohol / Drug Use Pain Medications: See PTA medication list Prescriptions: See PTA medication list Over the Counter: See PTA medication list History of alcohol / drug use?: Yes Substance #1 Name of Substance 1: Marijuana 1 - Age of First Use: Teens 1 - Amount (size/oz): Varies 1 - Frequency: Few times a month. 1 - Duration: off and on 1 - Last Use / Amount: Can't recall Substance #2 Name of Substance 2: CBD 2 - Age of First Use: Unknown 2 - Amount (size/oz): Unknown 2 - Frequency: Unknown 2 - Duration: Unknown 2 - Last Use / Amount: Few days ago.  CIWA: CIWA-Ar BP: (!) 120/56 Pulse Rate: (!) 56 COWS:    Allergies:  Allergies  Allergen Reactions  . Vicks Vaporub [Camph-Eucalypt-Men-Turp-Pet] Other (See Comments)    Caused eye irritation and shortness of breath    Home Medications:  (Not in a hospital admission)  OB/GYN Status:  No LMP for male patient.  General Assessment Data Location of Assessment: [facility] TTS Assessment: In system Is this a Tele or Face-to-Face Assessment?: Face-to-Face Is this an Initial Assessment or a Re-assessment for this encounter?: Initial Assessment Marital status: Single Is patient pregnant?: No Pregnancy Status: No Living Arrangements: Other (Comment)(Pt is homeless.) Can  pt return to current living arrangement?: Yes Admission Status: Voluntary Is patient capable of signing voluntary admission?: Yes Referral Source: Self/Family/Friend Insurance type: Self pay     Crisis Care Plan Living Arrangements: Other (Comment)(Pt is homeless.) Name of Psychiatrist: None Name of Therapist: None  Education Status Is patient currently in school?: No Is the patient employed, unemployed or receiving disability?: Unemployed  Risk to self with the past 6 months Suicidal Ideation: No Has patient been a risk to self within the past 6 months prior to admission? : Yes Suicidal Intent: No Has patient had any suicidal intent within the past 6 months prior to admission? : No Is patient at risk for suicide?: No Suicidal Plan?: No Has patient had any suicidal plan within the past 6 months prior to admission? : No Access to Means: No What has been your use of drugs/alcohol within the last 12 months?: Some marijuana use. Previous Attempts/Gestures: No How many times?: 0 Other Self Harm Risks: Scratching skin. Triggers for Past Attempts: None known Intentional Self Injurious Behavior: Damaging Comment - Self Injurious Behavior: Scratching skin Family Suicide History: Unknown Recent stressful life event(s): Other (Comment)(Homelessness) Persecutory voices/beliefs?: No Depression: No Depression Symptoms: (Pt denies current depressive symptoms) Substance abuse history and/or treatment for substance abuse?: Yes Suicide prevention information given to non-admitted patients: Not applicable  Risk to Others within the past 6 months Homicidal Ideation: No Does patient have any lifetime risk of violence toward others beyond the six months prior to admission? : No Thoughts of Harm to Others: No Current Homicidal Intent: No Current Homicidal Plan: No Access to Homicidal Means: No Identified Victim: No one History of harm to others?: No Assessment of Violence: None  Noted Violent Behavior Description: Pt denies Does patient have access to weapons?: No Criminal Charges Pending?: Yes Describe Pending Criminal Charges: Trespassing Does patient have a court date: Yes Court Date: 05/20/17 Is patient on probation?: No  Psychosis Hallucinations: None noted Delusions: None noted  Mental Status Report Appearance/Hygiene: Unremarkable, In scrubs Eye Contact: Poor Motor Activity: Freedom of movement, Unremarkable Speech: Logical/coherent, Soft Level of Consciousness: Quiet/awake Mood: Helpless Affect: Appropriate to circumstance Anxiety Level: None Thought Processes: Coherent, Relevant Judgement: Unimpaired Orientation: Person, Place, Time, Situation Obsessive Compulsive Thoughts/Behaviors: None  Cognitive Functioning Concentration: Fair Memory: Recent Intact, Remote Intact Is patient IDD: No Is patient DD?: No Insight: Poor Impulse Control: Fair Appetite: Poor Have you had any weight changes? : Loss Amount of the weight change? (lbs): 5 lbs Sleep: Decreased Total Hours of Sleep: 5 Vegetative Symptoms: None  ADLScreening [facility] Assessment Services) Patient's cognitive ability adequate to safely complete daily activities?: Yes Patient able to express need for assistance with ADLs?: Yes Independently performs ADLs?: Yes (appropriate for developmental age)  Prior Inpatient Therapy Prior Inpatient Therapy: No  Prior Outpatient Therapy Prior Outpatient Therapy: No Does patient have an ACCT team?: No Does patient have Intensive In-House Services?  : No Does patient have [facility] services? :  No Does patient have P4CC services?: No  ADL Screening (condition at time of admission) Patient's cognitive ability adequate to safely complete daily activities?: Yes Is the patient deaf or have difficulty hearing?: No Does the patient have difficulty seeing, even when wearing glasses/contacts?: No Does the patient have difficulty concentrating,  remembering, or making decisions?: No Patient able to express need for assistance with ADLs?: Yes Does the patient have difficulty dressing or bathing?: No Independently performs ADLs?: Yes (appropriate for developmental age) Does the patient have difficulty walking or climbing stairs?: No Weakness of Legs: None Weakness of Arms/Hands: None       Abuse/Neglect Assessment (Assessment to be complete while patient is alone) Physical Abuse: Denies Verbal Abuse: Denies Sexual Abuse: Denies Exploitation of patient/patient's resources: Denies Self-Neglect: Denies     Merchant navy officerAdvance Directives (For Healthcare) Does Patient Have a Medical Advance Directive?: No Would patient like information on creating a medical advance directive?: No - Patient declined          Disposition:  Disposition Initial Assessment Completed for this Encounter: Yes Patient referred to: Outpatient clinic referral  On Site Evaluation by:   Reviewed with Physician:    Alexandria LodgeHarvey, Shakeya Kerkman Ray 05/05/2017 10:52 PM

## 2017-05-05 NOTE — ED Notes (Signed)
No response when called to room from lobby. 

## 2017-05-05 NOTE — ED Provider Notes (Signed)
[facility]-EMERGENCY DEPT Provider Note   CSN: [id number] Arrival date & time: 05/05/17  1935     History   Chief Complaint Chief Complaint  Patient presents with  . Homeless  . Depression    HPI Bryan Payne is a 22 y.o. male who presents to ED for evaluation of worsening depression.  He is homeless and states that his depression is worsening based on that situation.  He denies any SI, HI, AVH.  He reports decrease in p.o. Intake due to his homelessness as well.  He has never been on any medications for depression in the past.  Denies any medication ingestions, access to firearms.  Denies any other symptoms at this time.  HPI  Past Medical History:  Diagnosis Date  . Knee derangement 04/2013   left  . Lateral meniscus tear 04/2013   left knee    Patient Active Problem List   Diagnosis Date Noted  . Adjustment disorder with disturbance of emotion 05/01/2017  . Mushroom poisoning 12/22/2014  . Acute renal failure (HCC) 12/22/2014  . 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 Dehydration 12/22/2014  . Ingestion of unknown nonmedicinal substance 12/21/2014  . Rectal bleeding 12/21/2014  . Generalized abdominal pain 12/21/2014  . Lateral meniscus tear 05/05/2013  . Appendicitis, acute-gangrenous s/p laparoscopic appendectomy  10/13/2012    Past Surgical History:  Procedure Laterality Date  . APPENDECTOMY    . KNEE ARTHROSCOPY WITH LATERAL MENISECTOMY Left 05/05/2013   Procedure: LEFT KNEE ARTHROSCOPY WITH LATERAL MENISCAL REPAIR;  Surgeon: Eulas Post, MD;  Location: [facility];  Service: Orthopedics;  Laterality: Left;  . LAPAROSCOPIC APPENDECTOMY Right 10/13/2012   Procedure: APPENDECTOMY LAPAROSCOPIC;  Surgeon: Mariella Saa, MD;  Location: [facility];  Service: General;  Laterality: Right;        Home Medications    Prior to Admission medications   Medication Sig Start Date End Date Taking?  Authorizing Provider  ibuprofen (ADVIL,MOTRIN) 200 MG tablet Take 200 mg by mouth every 6 (six) hours as needed for moderate pain.   Yes [provider]    Family History Family History  Problem Relation Age of Onset  . Kidney failure Father        Renal transplant  . Diabetes Other        Family on father's side    Social History Social History   Tobacco Use  . Smoking status: Current Every Day Smoker    Packs/day: 0.50    Types: Cigars, Cigarettes  . Smokeless tobacco: Never Used  Substance Use Topics  . Alcohol use: Yes    Comment: "often"  . Drug use: Yes    Types: Marijuana    Comment: daily     Allergies   Vicks vaporub [camph-eucalypt-men-turp-pet]   Review of Systems Review of Systems  Constitutional: Negative for appetite change, chills and fever.  HENT: Negative for ear pain, rhinorrhea, sneezing and sore throat.   Eyes: Negative for photophobia and visual disturbance.  Respiratory: Negative for cough, chest tightness, shortness of breath and wheezing.   Cardiovascular: Negative for chest pain and palpitations.  Gastrointestinal: Negative for abdominal pain, blood in stool, constipation, diarrhea, nausea and vomiting.  Genitourinary: Negative for dysuria, hematuria and urgency.  Musculoskeletal: Negative for myalgias.  Skin: Negative for rash.  Neurological: Negative for dizziness, weakness and light-headedness.  Psychiatric/Behavioral: Positive for dysphoric mood.     Physical Exam Updated Vital Signs BP (!) 120/56 (BP Location: Left  Arm)   Pulse (!) 56   Temp 98.7 F (37.1 C) (Oral)   Resp 18   Ht 5\' 10"  (1.778 m)   Wt 54.4 kg (120 lb)   SpO2 98%   BMI 17.22 kg/m   Physical Exam  Constitutional: He appears well-developed and well-nourished. No distress.  HENT:  Head: Normocephalic and atraumatic.  Nose: Nose normal.  Eyes: Conjunctivae and EOM are normal. Left eye exhibits no discharge. No scleral icterus.  Neck: Normal range  of motion. Neck supple.  Cardiovascular: Normal rate, regular rhythm, normal heart sounds and intact distal pulses. Exam reveals no gallop and no friction rub.  No murmur heard. Pulmonary/Chest: Effort normal and breath sounds normal. No respiratory distress.  Abdominal: Soft. Bowel sounds are normal. He exhibits no distension. There is no tenderness. There is no guarding.  Musculoskeletal: Normal range of motion. He exhibits no edema.  Neurological: He is alert. He exhibits normal muscle tone. Coordination normal.  Skin: Skin is warm and dry. No rash noted.  Psychiatric: His affect is blunt. He is not aggressive. He expresses no homicidal and no suicidal ideation. He expresses no suicidal plans and no homicidal plans.  Nursing note and vitals reviewed.    ED Treatments / Results  Labs (all labs ordered are listed, but only abnormal results are displayed) Labs Reviewed  COMPREHENSIVE METABOLIC PANEL - Abnormal; Notable for the following components:      Result Value   Calcium 8.7 (*)    Total Protein 6.1 (*)    Alkaline Phosphatase 32 (*)    All other components within normal limits  RAPID URINE DRUG SCREEN, HOSP PERFORMED - Abnormal; Notable for the following components:   Tetrahydrocannabinol POSITIVE (*)    All other components within normal limits  CBC WITH DIFFERENTIAL/PLATELET    EKG None  Radiology No results found.  Procedures Procedures (including critical care time)  Medications Ordered in ED Medications - No data to display   Initial Impression / Assessment and Plan / ED Course  I have reviewed the triage vital signs and the nursing notes.  Pertinent labs & imaging results that were available during my care of the patient were reviewed by me and considered in my medical decision making (see chart for details).     Patient is homeless and he presents for depression, decreased p.o. intake secondary to his homelessness.  He is seen and evaluated multiple times  at this ED for similar symptoms.  Will obtain medical clearance lab work and will consult TTS for evaluation.  He denies any symptoms at this time.  Denies any SI, HI, AVH.  TTS states that patient does not meet any inpatient criteria.  Spoke to Child psychotherapistsocial worker who gave patient information regarding Alcoholics Anonymous and Narcotics Anonymous and [facility] [place] for outpatient resources.  Patient is medically cleared with no complaints at this time.  Will discharge.  Final Clinical Impressions(s) / ED Diagnoses   Final diagnoses:  Homelessness  Depression, unspecified depression type    ED Discharge Orders    None       Dietrich PatesKhatri, Rishaan Gunner, PA-C 05/05/17 2254    Terrilee FilesButler, Michael C, MD 05/06/17 1209

## 2017-05-05 NOTE — ED Notes (Signed)
No response

## 2017-05-05 NOTE — ED Notes (Signed)
CSW in with pt. 

## 2017-05-05 NOTE — ED Notes (Signed)
Bed: WTR5 Expected date:  Expected time:  Means of arrival:  Comments: 

## 2017-05-05 NOTE — ED Notes (Signed)
No bus passes available.

## 2017-05-05 NOTE — ED Triage Notes (Signed)
Pt BIB [facility] complaining of feeling cold. Additionally, per EMS, pt has one episode of emesis PTA. He states that he ate some greasy foods causing him to vomit and feels better in that regard. A&Ox4.

## 2017-05-07 ENCOUNTER — Other Ambulatory Visit: Payer: Self-pay

## 2017-05-07 ENCOUNTER — Encounter (HOSPITAL_COMMUNITY): Payer: Self-pay | Admitting: *Deleted

## 2017-05-07 ENCOUNTER — Emergency Department (HOSPITAL_COMMUNITY)
Admission: EM | Admit: 2017-05-07 | Discharge: 2017-05-07 | Disposition: A | Discharge status: Home / Self Care | Payer: Medicaid Other | Attending: Emergency Medicine | Admitting: Emergency Medicine

## 2017-05-07 DIAGNOSIS — F1210 Cannabis abuse, uncomplicated: Secondary | ICD-10-CM

## 2017-05-07 DIAGNOSIS — F329 Major depressive disorder, single episode, unspecified: Secondary | ICD-10-CM | POA: Insufficient documentation

## 2017-05-07 DIAGNOSIS — F17210 Nicotine dependence, cigarettes, uncomplicated: Secondary | ICD-10-CM | POA: Insufficient documentation

## 2017-05-07 DIAGNOSIS — F32A Depression, unspecified: Secondary | ICD-10-CM

## 2017-05-07 DIAGNOSIS — Z5900 Homelessness unspecified: Secondary | ICD-10-CM

## 2017-05-07 HISTORY — DX: Homelessness unspecified: Z59.00

## 2017-05-07 HISTORY — DX: Homelessness: Z59.0

## 2017-05-07 NOTE — ED Provider Notes (Addendum)
[facility]-EMERGENCY DEPT Provider Note: [name] Constantine Ruddick, MD, FACEP  CSN: [id number] MRN: [id number] ARRIVAL: 05/07/17 at 0348 ROOM: WA29/WA29   CHIEF COMPLAINT  Depression   HISTORY OF PRESENT ILLNESS  05/07/17 5:53 AM Bryan Payne is a homeless 22 y.o. male who was seen 2 days ago for depression.  He attributes his depression to being homeless.  At that time he denied any suicidal ideation, homicidal ideation or hallucinations.  He was assessed by TTS who determined he did not meet inpatient criteria.  He was given outpatient referrals including to [facility].  He states he attempted to call [facility] but no unanswered.  He came in this morning because he needed somewhere to stay.   He denies suicidal ideation but admits to self-harm.  Specifically he has been scratching his legs.  This was noted on previous visit and his wounds were dressed.  He denies any other acute somatic complaint.  He does admit to smoking marijuana but denies alcohol or other drug use.   Past Medical History:  Diagnosis Date  . Homeless   . Knee derangement 04/2013   left  . Lateral meniscus tear 04/2013   left knee    Past Surgical History:  Procedure Laterality Date  . APPENDECTOMY    . KNEE ARTHROSCOPY WITH LATERAL MENISECTOMY Left 05/05/2013   Procedure: LEFT KNEE ARTHROSCOPY WITH LATERAL MENISCAL REPAIR;  Surgeon: Eulas PostJoshua P Landau, MD;  Location: [facility];  Service: Orthopedics;  Laterality: Left;  . LAPAROSCOPIC APPENDECTOMY Right 10/13/2012   Procedure: APPENDECTOMY LAPAROSCOPIC;  Surgeon: Mariella SaaBenjamin T Hoxworth, MD;  Location: [facility];  Service: General;  Laterality: Right;    Family History  Problem Relation Age of Onset  . Kidney failure Father        Renal transplant  . Diabetes Other        Family on father's side    Social History   Tobacco Use  . Smoking status: Current Every Day Smoker    Packs/day: 0.50    Types: Cigars, Cigarettes  . Smokeless tobacco: Never Used    Substance Use Topics  . Alcohol use: Yes    Comment: "often"  . Drug use: Yes    Types: Marijuana    Comment: daily    Prior to Admission medications   Medication Sig Start Date End Date Taking? Authorizing Provider  ibuprofen (ADVIL,MOTRIN) 200 MG tablet Take 200 mg by mouth every 6 (six) hours as needed for moderate pain.    [provider]    Allergies Vicks vaporub [camph-eucalypt-men-turp-pet]   REVIEW OF SYSTEMS  Negative except as noted here or in the History of Present Illness.   PHYSICAL EXAMINATION  Initial Vital Signs Blood pressure (!) 127/53, pulse (!) 58, temperature 98 F (36.7 C), temperature source Oral, resp. rate 16, height 5\' 10"  (1.778 m), weight 54.4 kg (120 lb), SpO2 98 %.  Examination General: Well-developed, well-nourished male in no acute distress; appearance consistent with age of record HENT: normocephalic; atraumatic Eyes: pupils equal, round and reactive to light; extraocular muscles intact Neck: supple Heart: regular rate and rhythm Lungs: clear to auscultation bilaterally Abdomen: soft; nondistended; nontender; bowel sounds present Extremities: No deformity; full range of motion; pulses normal Neurologic: Awake, alert and oriented; motor function intact in all extremities and symmetric; no facial droop Skin: Warm and dry Psychiatric: Depressed mood with congruent affect; no HI or SI; polite and cooperative   RESULTS  Summary of this visit's results, reviewed by myself:  EKG Interpretation  Date/Time:    Ventricular Rate:    PR Interval:    QRS Duration:   QT Interval:    QTC Calculation:   R Axis:     Text Interpretation:        Laboratory Studies: No results found for this or any previous visit (from the past 24 hour(s)). Imaging Studies: No results found.  ED COURSE  Nursing notes and initial vitals signs, including pulse oximetry, reviewed.  Vitals:   05/07/17 0408  BP: (!) 127/53  Pulse: (!) 58  Resp:  16  Temp: 98 F (36.7 C)  TempSrc: Oral  SpO2: 98%  Weight: 54.4 kg (120 lb)  Height: 5\' 10"  (1.778 m)   TTS reevaluation of the patient does not appear indicated at this time.  He has outpatient follow-up referrals already given.   PROCEDURES    ED DIAGNOSES     ICD-10-CM   1. Depression, unspecified depression type F32.9   2. Homelessness Z59.0   3. Cannabis abuse F12.10        Nhung Danko, Jonny Ruiz, MD 05/07/17 4010    Paula Libra, MD 05/07/17 [phone]

## 2017-05-07 NOTE — ED Triage Notes (Signed)
Pt stated "I'm feeling depressed.  I tried to call [facility] today but no one answered.  I couldn't stay out tonight.  I just need 1 more night."

## 2017-07-28 ENCOUNTER — Emergency Department (HOSPITAL_COMMUNITY)
Admission: EM | Admit: 2017-07-28 | Discharge: 2017-07-28 | Disposition: A | Discharge status: Home / Self Care | Payer: Self-pay | Attending: Emergency Medicine | Admitting: Emergency Medicine

## 2017-07-28 ENCOUNTER — Other Ambulatory Visit: Payer: Self-pay

## 2017-07-28 DIAGNOSIS — F17210 Nicotine dependence, cigarettes, uncomplicated: Secondary | ICD-10-CM | POA: Insufficient documentation

## 2017-07-28 DIAGNOSIS — Z5900 Homelessness unspecified: Secondary | ICD-10-CM

## 2017-07-28 DIAGNOSIS — X31XXXA Exposure to excessive natural cold, initial encounter: Secondary | ICD-10-CM | POA: Insufficient documentation

## 2017-07-28 DIAGNOSIS — F1210 Cannabis abuse, uncomplicated: Secondary | ICD-10-CM | POA: Insufficient documentation

## 2017-07-28 DIAGNOSIS — F17290 Nicotine dependence, other tobacco product, uncomplicated: Secondary | ICD-10-CM | POA: Insufficient documentation

## 2017-07-28 LAB — CBG MONITORING, ED: Glucose-Capillary: 92 mg/dL (ref 65–99)
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
## 2017-08-15 ENCOUNTER — Emergency Department (HOSPITAL_COMMUNITY)
Admission: EM | Admit: 2017-08-15 | Discharge: 2017-08-15 | Disposition: A | Discharge status: Home / Self Care | Payer: Medicaid Other | Attending: Emergency Medicine | Admitting: Emergency Medicine

## 2017-08-15 ENCOUNTER — Encounter (HOSPITAL_COMMUNITY): Payer: Self-pay | Admitting: *Deleted

## 2017-08-15 ENCOUNTER — Emergency Department (HOSPITAL_COMMUNITY): Payer: Medicaid Other

## 2017-08-15 ENCOUNTER — Other Ambulatory Visit: Payer: Self-pay

## 2017-08-15 DIAGNOSIS — F17210 Nicotine dependence, cigarettes, uncomplicated: Secondary | ICD-10-CM | POA: Insufficient documentation

## 2017-08-15 DIAGNOSIS — Y999 Unspecified external cause status: Secondary | ICD-10-CM | POA: Insufficient documentation

## 2017-08-15 DIAGNOSIS — S0990XA Unspecified injury of head, initial encounter: Secondary | ICD-10-CM

## 2017-08-15 DIAGNOSIS — Z590 Homelessness: Secondary | ICD-10-CM | POA: Insufficient documentation

## 2017-08-15 DIAGNOSIS — S60512A Abrasion of left hand, initial encounter: Secondary | ICD-10-CM | POA: Insufficient documentation

## 2017-08-15 DIAGNOSIS — Y929 Unspecified place or not applicable: Secondary | ICD-10-CM | POA: Insufficient documentation

## 2017-08-15 DIAGNOSIS — S0083XA Contusion of other part of head, initial encounter: Secondary | ICD-10-CM | POA: Insufficient documentation

## 2017-08-15 DIAGNOSIS — S90811A Abrasion, right foot, initial encounter: Secondary | ICD-10-CM | POA: Insufficient documentation

## 2017-08-15 DIAGNOSIS — S90812A Abrasion, left foot, initial encounter: Secondary | ICD-10-CM | POA: Insufficient documentation

## 2017-08-15 DIAGNOSIS — Z23 Encounter for immunization: Secondary | ICD-10-CM | POA: Insufficient documentation

## 2017-08-15 DIAGNOSIS — S90212D Contusion of left great toe with damage to nail, subsequent encounter: Secondary | ICD-10-CM | POA: Insufficient documentation

## 2017-08-15 DIAGNOSIS — T07XXXA Unspecified multiple injuries, initial encounter: Secondary | ICD-10-CM

## 2017-08-15 DIAGNOSIS — Y939 Activity, unspecified: Secondary | ICD-10-CM | POA: Insufficient documentation

## 2017-08-15 DIAGNOSIS — W231XXA Caught, crushed, jammed, or pinched between stationary objects, initial encounter: Secondary | ICD-10-CM | POA: Insufficient documentation

## 2017-08-15 MED ORDER — TETANUS-DIPHTH-ACELL PERTUSSIS 5-2.5-18.5 LF-MCG/0.5 IM SUSP
0.5000 mL | Freq: Once | INTRAMUSCULAR | Status: AC
  Administered 2017-08-15: 0.5 mL via INTRAMUSCULAR
  Filled 2017-08-15: qty 0.5

## 2017-08-15 MED ORDER — BACITRACIN ZINC 500 UNIT/GM EX OINT
TOPICAL_OINTMENT | Freq: Once | CUTANEOUS | Status: AC
  Administered 2017-08-15: 1 via TOPICAL
  Filled 2017-08-15: qty 0.9

## 2017-08-15 NOTE — ED Notes (Signed)
Attempted x1 to make pt aware that he needs to wait in lobby if he has no one to pick him up.
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
## 2017-08-15 NOTE — ED Notes (Signed)
Pt was given blue scrubs for discharge.
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
## 2017-08-16 ENCOUNTER — Emergency Department (HOSPITAL_COMMUNITY)
Admission: EM | Admit: 2017-08-16 | Discharge: 2017-08-16 | Disposition: A | Discharge status: Home / Self Care | Payer: Medicaid Other | Attending: Emergency Medicine | Admitting: Emergency Medicine

## 2017-08-16 DIAGNOSIS — S90212A Contusion of left great toe with damage to nail, initial encounter: Secondary | ICD-10-CM

## 2017-08-16 DIAGNOSIS — M79675 Pain in left toe(s): Secondary | ICD-10-CM

## 2017-08-16 MED ORDER — IBUPROFEN 200 MG PO TABS
600.0000 mg | ORAL_TABLET | Freq: Once | ORAL | Status: AC
  Administered 2017-08-16: 600 mg via ORAL
  Filled 2017-08-16: qty 3

## 2017-08-16 NOTE — Discharge Instructions (Addendum)
Motrin and Tylenol for pain.  Keep the area clean and dry.  Watch for signs of infection.  Apply ice to the affected area to help with pain.  Use the crutches for weightbearing as tolerated.  Follow-up with primary care.
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
## 2017-08-17 ENCOUNTER — Encounter (HOSPITAL_COMMUNITY): Payer: Self-pay

## 2017-08-17 ENCOUNTER — Emergency Department (HOSPITAL_COMMUNITY): Payer: Medicaid Other

## 2017-08-17 ENCOUNTER — Emergency Department (HOSPITAL_COMMUNITY)
Admission: EM | Admit: 2017-08-17 | Discharge: 2017-08-17 | Disposition: A | Discharge status: Home / Self Care | Payer: Medicaid Other | Attending: Emergency Medicine | Admitting: Emergency Medicine

## 2017-08-17 ENCOUNTER — Other Ambulatory Visit: Payer: Self-pay

## 2017-08-17 DIAGNOSIS — S0990XA Unspecified injury of head, initial encounter: Secondary | ICD-10-CM

## 2017-08-17 DIAGNOSIS — Y92009 Unspecified place in unspecified non-institutional (private) residence as the place of occurrence of the external cause: Secondary | ICD-10-CM | POA: Insufficient documentation

## 2017-08-17 DIAGNOSIS — S0083XA Contusion of other part of head, initial encounter: Secondary | ICD-10-CM | POA: Insufficient documentation

## 2017-08-17 DIAGNOSIS — F17210 Nicotine dependence, cigarettes, uncomplicated: Secondary | ICD-10-CM | POA: Insufficient documentation

## 2017-08-17 DIAGNOSIS — Z590 Homelessness: Secondary | ICD-10-CM | POA: Insufficient documentation

## 2017-08-17 DIAGNOSIS — Y939 Activity, unspecified: Secondary | ICD-10-CM | POA: Insufficient documentation

## 2017-08-17 DIAGNOSIS — Y998 Other external cause status: Secondary | ICD-10-CM | POA: Insufficient documentation

## 2017-08-17 MED ORDER — ACETAMINOPHEN 325 MG PO TABS
650.0000 mg | ORAL_TABLET | Freq: Once | ORAL | Status: AC
  Administered 2017-08-17: 650 mg via ORAL
  Filled 2017-08-17: qty 2

## 2017-08-17 NOTE — ED Notes (Signed)
Pt uncooperative, removed BP cuff and Sp02 sensor
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
## 2017-08-17 NOTE — ED Notes (Signed)
Bed: [id number] Expected date: 08/17/17 Expected time: 8:42 AM Means of arrival: Ambulance Comments: EMS
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
## 2017-08-22 ENCOUNTER — Encounter (HOSPITAL_BASED_OUTPATIENT_CLINIC_OR_DEPARTMENT_OTHER): Payer: Self-pay | Admitting: *Deleted

## 2017-08-22 ENCOUNTER — Other Ambulatory Visit: Payer: Self-pay

## 2017-08-22 ENCOUNTER — Emergency Department (HOSPITAL_BASED_OUTPATIENT_CLINIC_OR_DEPARTMENT_OTHER)
Admission: EM | Admit: 2017-08-22 | Discharge: 2017-08-22 | Disposition: A | Discharge status: Home / Self Care | Payer: Medicaid Other | Attending: Emergency Medicine | Admitting: Emergency Medicine

## 2017-08-22 DIAGNOSIS — H209 Unspecified iridocyclitis: Secondary | ICD-10-CM | POA: Insufficient documentation

## 2017-08-22 DIAGNOSIS — Y999 Unspecified external cause status: Secondary | ICD-10-CM | POA: Insufficient documentation

## 2017-08-22 DIAGNOSIS — Y939 Activity, unspecified: Secondary | ICD-10-CM | POA: Insufficient documentation

## 2017-08-22 DIAGNOSIS — S0502XA Injury of conjunctiva and corneal abrasion without foreign body, left eye, initial encounter: Secondary | ICD-10-CM | POA: Insufficient documentation

## 2017-08-22 DIAGNOSIS — H5712 Ocular pain, left eye: Secondary | ICD-10-CM

## 2017-08-22 DIAGNOSIS — F17210 Nicotine dependence, cigarettes, uncomplicated: Secondary | ICD-10-CM | POA: Insufficient documentation

## 2017-08-22 DIAGNOSIS — Y929 Unspecified place or not applicable: Secondary | ICD-10-CM | POA: Insufficient documentation

## 2017-08-22 DIAGNOSIS — W228XXA Striking against or struck by other objects, initial encounter: Secondary | ICD-10-CM | POA: Insufficient documentation

## 2017-08-22 MED ORDER — TETRACAINE HCL 0.5 % OP SOLN
OPHTHALMIC | Status: AC
  Administered 2017-08-22: 1 [drp]
  Filled 2017-08-22: qty 4

## 2017-08-22 MED ORDER — ERYTHROMYCIN 5 MG/GM OP OINT
TOPICAL_OINTMENT | Freq: Four times a day (QID) | OPHTHALMIC | Status: DC
  Administered 2017-08-22: 05:00:00 via OPHTHALMIC
  Filled 2017-08-22: qty 3.5

## 2017-08-22 MED ORDER — CYCLOPENTOLATE HCL 1 % OP SOLN
2.0000 [drp] | Freq: Once | OPHTHALMIC | Status: AC
  Administered 2017-08-22: 2 [drp] via OPHTHALMIC
  Filled 2017-08-22: qty 2

## 2017-08-22 MED ORDER — FLUORESCEIN SODIUM 1 MG OP STRP
ORAL_STRIP | OPHTHALMIC | Status: AC
  Administered 2017-08-22: 1
  Filled 2017-08-22: qty 1
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
## 2017-08-22 NOTE — ED Notes (Signed)
Pt and cousin given d/c instructions as per chart. Verbalizes understanding. No questions.
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
## 2017-08-22 NOTE — Discharge Instructions (Addendum)
Apply the antibacterial ointment 4 times a day for the next 7 days  Call the eye doctor for follow up if not improving in 3 days

## 2017-09-01 ENCOUNTER — Emergency Department (HOSPITAL_COMMUNITY)
Admission: EM | Admit: 2017-09-01 | Discharge: 2017-09-01 | Disposition: A | Discharge status: Home / Self Care | Payer: Medicaid Other | Attending: Emergency Medicine | Admitting: Emergency Medicine

## 2017-09-01 ENCOUNTER — Other Ambulatory Visit: Payer: Self-pay

## 2017-09-01 ENCOUNTER — Emergency Department (HOSPITAL_COMMUNITY): Payer: Medicaid Other

## 2017-09-01 ENCOUNTER — Encounter (HOSPITAL_COMMUNITY): Payer: Self-pay

## 2017-09-01 DIAGNOSIS — E860 Dehydration: Secondary | ICD-10-CM | POA: Insufficient documentation

## 2017-09-01 DIAGNOSIS — F1290 Cannabis use, unspecified, uncomplicated: Secondary | ICD-10-CM | POA: Insufficient documentation

## 2017-09-01 DIAGNOSIS — F17210 Nicotine dependence, cigarettes, uncomplicated: Secondary | ICD-10-CM | POA: Insufficient documentation

## 2017-09-01 LAB — I-STAT CHEM 8, ED
BUN: 14 mg/dL (ref 6–20)
CALCIUM ION: 1.16 mmol/L (ref 1.15–1.40)
CHLORIDE: 101 mmol/L (ref 98–111)
Creatinine, Ser: 0.9 mg/dL (ref 0.61–1.24)
GLUCOSE: 111 mg/dL — AB (ref 70–99)
HCT: 43 % (ref 39.0–52.0)
HEMOGLOBIN: 14.6 g/dL (ref 13.0–17.0)
POTASSIUM: 3.7 mmol/L (ref 3.5–5.1)
SODIUM: 140 mmol/L (ref 135–145)
TCO2: 28 mmol/L (ref 22–32)

## 2017-09-01 LAB — CBG MONITORING, ED: GLUCOSE-CAPILLARY: 119 mg/dL — AB (ref 70–99)

## 2017-09-01 NOTE — ED Notes (Signed)
Bed: [id number] Expected date:  Expected time:  Means of arrival:  Comments: EMS/"Tired"

## 2017-09-01 NOTE — ED Notes (Signed)
RN has provided fluids and meal for patient.

## 2017-09-01 NOTE — ED Notes (Signed)
When X-ray tech went into room pt stated he would not be able to ambulate and that he needed to remain on bed. RN went in and informed patient that he needed to ambulate and provided pt clean socks. Pt put on socks himself and Pt ambulated to X-ray without any problems or gait issues.

## 2017-09-01 NOTE — ED Provider Notes (Signed)
[facility]-EMERGENCY DEPT Provider Note   CSN: [id number] Arrival date & time: 09/01/17  0813     History   Chief Complaint Chief Complaint  Patient presents with  . Fatigue    HPI Bryan Payne is a 22 y.o. male.  Patient states that he has not been drinking much fluids that he feels weak.  The history is provided by the patient. No language interpreter was used.  Weakness  Primary symptoms include no focal weakness. This is a new problem. The current episode started 3 to 5 hours ago. The problem has not changed since onset.There was no focality noted. There has been no fever. The fever has been present for 3 to 4 days. Pertinent negatives include no shortness of breath, no chest pain and no headaches. There were no medications administered prior to arrival.    Past Medical History:  Diagnosis Date  . Homeless   . Knee derangement 04/2013   left  . Lateral meniscus tear 04/2013   left knee    Patient Active Problem List   Diagnosis Date Noted  . Adjustment disorder with disturbance of emotion 05/01/2017  . Mushroom poisoning 12/22/2014  . Acute renal failure (HCC) 12/22/2014  . 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 Dehydration 12/22/2014  . Ingestion of unknown nonmedicinal substance 12/21/2014  . Rectal bleeding 12/21/2014  . Generalized abdominal pain 12/21/2014  . Lateral meniscus tear 05/05/2013  . Appendicitis, acute-gangrenous s/p laparoscopic appendectomy  10/13/2012    Past Surgical History:  Procedure Laterality Date  . APPENDECTOMY    . KNEE ARTHROSCOPY WITH LATERAL MENISECTOMY Left 05/05/2013   Procedure: LEFT KNEE ARTHROSCOPY WITH LATERAL MENISCAL REPAIR;  Surgeon: Eulas Post, MD;  Location: [facility];  Service: Orthopedics;  Laterality: Left;  . LAPAROSCOPIC APPENDECTOMY Right 10/13/2012   Procedure: APPENDECTOMY LAPAROSCOPIC;  Surgeon: Mariella Saa, MD;  Location: [facility];   Service: General;  Laterality: Right;        Home Medications    Prior to Admission medications   Not on File    Family History Family History  Problem Relation Age of Onset  . Kidney failure Father        Renal transplant  . Diabetes Other        Family on father's side    Social History Social History   Tobacco Use  . Smoking status: Current Every Day Smoker    Packs/day: 0.50    Types: Cigars, Cigarettes  . Smokeless tobacco: Never Used  Substance Use Topics  . Alcohol use: Yes    Comment: "often"  . Drug use: Yes    Types: Marijuana    Comment: daily     Allergies   Vicks vaporub [camph-eucalypt-men-turp-pet]   Review of Systems Review of Systems  Constitutional: Negative for appetite change and fatigue.  HENT: Negative for congestion, ear discharge and sinus pressure.   Eyes: Negative for discharge.  Respiratory: Negative for cough and shortness of breath.   Cardiovascular: Negative for chest pain.  Gastrointestinal: Negative for abdominal pain and diarrhea.  Genitourinary: Negative for frequency and hematuria.  Musculoskeletal: Negative for back pain.  Skin: Negative for rash.  Neurological: Positive for weakness. Negative for focal weakness, seizures and headaches.  Psychiatric/Behavioral: Negative for hallucinations.     Physical Exam Updated Vital Signs BP 115/64 (BP Location: Left Arm)   Pulse 61   Temp 98 F (36.7 C) (Oral)   Resp 17   Ht  5\' 7"  (1.702 m)   Wt 65.8 kg (145 lb)   SpO2 100%   BMI 22.71 kg/m   Physical Exam  Constitutional: He is oriented to person, place, and time. He appears well-developed.  HENT:  Head: Normocephalic.  Eyes: Conjunctivae and EOM are normal. No scleral icterus.  Neck: Neck supple. No thyromegaly present.  Cardiovascular: Normal rate and regular rhythm. Exam reveals no gallop and no friction rub.  No murmur heard. Pulmonary/Chest: No stridor. He has no wheezes. He has no rales. He exhibits no  tenderness.  Abdominal: He exhibits no distension. There is no tenderness. There is no rebound.  Musculoskeletal: Normal range of motion. He exhibits no edema.  Lymphadenopathy:    He has no cervical adenopathy.  Neurological: He is oriented to person, place, and time. He exhibits normal muscle tone. Coordination normal.  Skin: No rash noted. No erythema.  Psychiatric: He has a normal mood and affect. His behavior is normal.     ED Treatments / Results  Labs (all labs ordered are listed, but only abnormal results are displayed) Labs Reviewed  I-STAT CHEM 8, ED - Abnormal; Notable for the following components:      Result Value   Glucose, Bld 111 (*)    All other components within normal limits  CBG MONITORING, ED - Abnormal; Notable for the following components:   Glucose-Capillary 119 (*)    All other components within normal limits    EKG EKG Interpretation  Date/Time:  Wednesday September 01 2017 08:26:12 EDT Ventricular Rate:  57 PR Interval:    QRS Duration: 87 QT Interval:  414 QTC Calculation: 404 R Axis:   92 Text Interpretation:  Sinus rhythm Atrial premature complex Borderline right axis deviation Confirmed by Bethann BerkshireZammit, Therin Vetsch [id number]) on 09/01/2017 9:46:57 AM   Radiology Dg Chest 2 View  Result Date: 09/01/2017 CLINICAL DATA:  Fatigue EXAM: CHEST - 2 VIEW COMPARISON:  April 12, 2017 FINDINGS: Lungs are clear. Heart size and pulmonary vascularity are normal. No adenopathy. No bone lesions. IMPRESSION: No edema or consolidation. Electronically Signed   By: Bretta BangWilliam  Woodruff III M.D.   On: 09/01/2017 09:27    Procedures Procedures (including critical care time)  Medications Ordered in ED Medications - No data to display   Initial Impression / Assessment and Plan / ED Course  I have reviewed the triage vital signs and the nursing notes.  Pertinent labs & imaging results that were available during my care of the patient were reviewed by me and considered in my medical  decision making (see chart for details).     Patient with weakness and mild dehydration.  Labs EKG and chest x-ray unremarkable.  He has been hydrated orally and can follow-up as needed  Final Clinical Impressions(s) / ED Diagnoses   Final diagnoses:  Dehydration    ED Discharge Orders    None       Bethann BerkshireZammit, Cherisa Brucker, MD 09/01/17 [id number]
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
## 2017-09-01 NOTE — Discharge Instructions (Addendum)
Drink plenty of fluids and follow-up if any problems 

## 2017-09-01 NOTE — ED Notes (Signed)
Provided patient with Mal[place] sandwich, cheese stick, sprite and water upon discharge.

## 2017-09-01 NOTE — ED Notes (Signed)
ED Provider at bedside. 

## 2017-09-03 ENCOUNTER — Encounter (HOSPITAL_COMMUNITY): Payer: Self-pay | Admitting: Emergency Medicine

## 2017-09-03 ENCOUNTER — Other Ambulatory Visit: Payer: Self-pay

## 2017-09-03 ENCOUNTER — Emergency Department (HOSPITAL_COMMUNITY)
Admission: EM | Admit: 2017-09-03 | Discharge: 2017-09-03 | Disposition: A | Discharge status: Home / Self Care | Payer: Medicaid Other | Attending: Emergency Medicine | Admitting: Emergency Medicine

## 2017-09-03 DIAGNOSIS — Z5900 Homelessness unspecified: Secondary | ICD-10-CM

## 2017-09-03 DIAGNOSIS — E860 Dehydration: Secondary | ICD-10-CM | POA: Insufficient documentation

## 2017-09-03 LAB — CBC WITH DIFFERENTIAL/PLATELET
Basophils Absolute: 0 10*3/uL (ref 0.0–0.1)
Basophils Relative: 0 %
Eosinophils Absolute: 0.2 10*3/uL (ref 0.0–0.7)
Eosinophils Relative: 2 %
HCT: 43.8 % (ref 39.0–52.0)
HEMOGLOBIN: 15 g/dL (ref 13.0–17.0)
Lymphocytes Relative: 38 %
Lymphs Abs: 3.3 10*3/uL (ref 0.7–4.0)
MCH: 31.4 pg (ref 26.0–34.0)
MCHC: 34.2 g/dL (ref 30.0–36.0)
MCV: 91.8 fL (ref 78.0–100.0)
Monocytes Absolute: 0.3 10*3/uL (ref 0.1–1.0)
Monocytes Relative: 4 %
NEUTROS PCT: 56 %
Neutro Abs: 4.8 10*3/uL (ref 1.7–7.7)
Platelets: 262 10*3/uL (ref 150–400)
RBC: 4.77 MIL/uL (ref 4.22–5.81)
RDW: 12.2 % (ref 11.5–15.5)
WBC: 8.6 10*3/uL (ref 4.0–10.5)

## 2017-09-03 LAB — COMPREHENSIVE METABOLIC PANEL
ALT: 24 U/L (ref 0–44)
AST: 24 U/L (ref 15–41)
Albumin: 4.3 g/dL (ref 3.5–5.0)
Alkaline Phosphatase: 36 U/L — ABNORMAL LOW (ref 38–126)
Anion gap: 5 (ref 5–15)
BILIRUBIN TOTAL: 1.1 mg/dL (ref 0.3–1.2)
BUN: 15 mg/dL (ref 6–20)
CO2: 29 mmol/L (ref 22–32)
Calcium: 9.4 mg/dL (ref 8.9–10.3)
Chloride: 107 mmol/L (ref 98–111)
Creatinine, Ser: 1.05 mg/dL (ref 0.61–1.24)
Glucose, Bld: 92 mg/dL (ref 70–99)
Potassium: 3.5 mmol/L (ref 3.5–5.1)
Sodium: 141 mmol/L (ref 135–145)
TOTAL PROTEIN: 6.7 g/dL (ref 6.5–8.1)

## 2017-09-03 LAB — CK: Total CK: 352 U/L (ref 49–397)

## 2017-09-03 MED ORDER — SODIUM CHLORIDE 0.9 % IV BOLUS
1000.0000 mL | Freq: Once | INTRAVENOUS | Status: AC
  Administered 2017-09-03: 1000 mL via INTRAVENOUS
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
## 2017-09-03 NOTE — ED Notes (Signed)
Pt given a food tray to eat.

## 2017-09-03 NOTE — ED Provider Notes (Signed)
[facility]-EMERGENCY DEPT Provider Note   CSN: [id number] Arrival date & time: 09/03/17  1526     History   Chief Complaint Chief Complaint  Patient presents with  . Heat Exposure    HPI Bryan Payne is a 22 y.o. male.  Pt presents to the ED today with possible heat exhaustion.  The pt was here yesterday for the same.  He is homeless and said he has not been eating or drinking much.  The pt denies any pain.  The pt denies n/v.     Past Medical History:  Diagnosis Date  . Homeless   . Knee derangement 04/2013   left  . Lateral meniscus tear 04/2013   left knee    Patient Active Problem List   Diagnosis Date Noted  . Adjustment disorder with disturbance of emotion 05/01/2017  . Mushroom poisoning 12/22/2014  . Acute renal failure (HCC) 12/22/2014  . 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 Dehydration 12/22/2014  . Ingestion of unknown nonmedicinal substance 12/21/2014  . Rectal bleeding 12/21/2014  . Generalized abdominal pain 12/21/2014  . Lateral meniscus tear 05/05/2013  . Appendicitis, acute-gangrenous s/p laparoscopic appendectomy  10/13/2012    Past Surgical History:  Procedure Laterality Date  . APPENDECTOMY    . KNEE ARTHROSCOPY WITH LATERAL MENISECTOMY Left 05/05/2013   Procedure: LEFT KNEE ARTHROSCOPY WITH LATERAL MENISCAL REPAIR;  Surgeon: Eulas Post, MD;  Location: [facility];  Service: Orthopedics;  Laterality: Left;  . LAPAROSCOPIC APPENDECTOMY Right 10/13/2012   Procedure: APPENDECTOMY LAPAROSCOPIC;  Surgeon: Mariella Saa, MD;  Location: [facility];  Service: General;  Laterality: Right;        Home Medications    Prior to Admission medications   Not on File    Family History Family History  Problem Relation Age of Onset  . Kidney failure Father        Renal transplant  . Diabetes Other        Family on father's side    Social History Social History   Tobacco  Use  . Smoking status: Current Every Day Smoker    Packs/day: 0.50    Types: Cigars, Cigarettes  . Smokeless tobacco: Never Used  Substance Use Topics  . Alcohol use: Yes    Comment: "often"  . Drug use: Yes    Types: Marijuana    Comment: daily     Allergies   Vicks vaporub [camph-eucalypt-men-turp-pet]   Review of Systems Review of Systems  Neurological: Positive for weakness.  All other systems reviewed and are negative.    Physical Exam Updated Vital Signs BP (!) 107/47 (BP Location: Right Arm)   Pulse 60   Temp 98.9 F (37.2 C) (Oral)   Resp (!) 23   Ht 5\' 6"  (1.676 m)   Wt 74.8 kg (165 lb)   SpO2 100%   BMI 26.63 kg/m   Physical Exam  Constitutional: He is oriented to person, place, and time. He appears well-developed and well-nourished.  HENT:  Head: Normocephalic and atraumatic.  Right Ear: External ear normal.  Left Ear: External ear normal.  Nose: Nose normal.  Mouth/Throat: Oropharynx is clear and moist.  Eyes: Pupils are equal, round, and reactive to light. Conjunctivae and EOM are normal.  Neck: Normal range of motion. Neck supple.  Cardiovascular: Regular rhythm, normal heart sounds and intact distal pulses. Tachycardia present.  Pulmonary/Chest: Effort normal and breath sounds normal.  Abdominal: Soft. Bowel sounds are normal.  Musculoskeletal: Normal range of motion.  Neurological: He is alert and oriented to person, place, and time.  Skin: Skin is warm. Capillary refill takes less than 2 seconds.  Psychiatric: He has a normal mood and affect. His behavior is normal. Judgment and thought content normal.  Nursing note and vitals reviewed.    ED Treatments / Results  Labs (all labs ordered are listed, but only abnormal results are displayed) Labs Reviewed  COMPREHENSIVE METABOLIC PANEL - Abnormal; Notable for the following components:      Result Value   Alkaline Phosphatase 36 (*)    All other components within normal limits  CBC WITH  DIFFERENTIAL/PLATELET  CK  URINALYSIS, ROUTINE W REFLEX MICROSCOPIC  RAPID URINE DRUG SCREEN, HOSP PERFORMED  CBC WITH DIFFERENTIAL/PLATELET    EKG None  Radiology No results found.  Procedures Procedures (including critical care time)  Medications Ordered in ED Medications  sodium chloride 0.9 % bolus 1,000 mL (0 mLs Intravenous Stopped 09/03/17 1844)     Initial Impression / Assessment and Plan / ED Course  I have reviewed the triage vital signs and the nursing notes.  Pertinent labs & imaging results that were available during my care of the patient were reviewed by me and considered in my medical decision making (see chart for details).     Pt feels much better.  He has eaten and drank.  He is stable for d/c.  He is provided with resources for shelters.  Return if worse.  Final Clinical Impressions(s) / ED Diagnoses   Final diagnoses:  Dehydration  Homeless    ED Discharge Orders    None       Jacalyn LefevreHaviland, Silva Aamodt, MD 09/03/17 2056

## 2017-09-04 ENCOUNTER — Encounter (HOSPITAL_COMMUNITY): Payer: Self-pay | Admitting: Emergency Medicine

## 2017-09-04 ENCOUNTER — Other Ambulatory Visit: Payer: Self-pay

## 2017-09-04 ENCOUNTER — Emergency Department (HOSPITAL_COMMUNITY)
Admission: EM | Admit: 2017-09-04 | Discharge: 2017-09-04 | Disposition: A | Discharge status: Home / Self Care | Payer: Medicaid Other | Attending: Emergency Medicine | Admitting: Emergency Medicine

## 2017-09-04 DIAGNOSIS — B360 Pityriasis versicolor: Secondary | ICD-10-CM

## 2017-09-04 DIAGNOSIS — Z590 Homelessness: Secondary | ICD-10-CM | POA: Insufficient documentation

## 2017-09-04 DIAGNOSIS — F17210 Nicotine dependence, cigarettes, uncomplicated: Secondary | ICD-10-CM | POA: Insufficient documentation

## 2017-09-04 MED ORDER — SELENIUM SULFIDE 1 % EX LOTN: 1.0000 "application " | TOPICAL_LOTION | Freq: Every day | CUTANEOUS | 0 refills | Status: DC

## 2017-09-04 NOTE — ED Notes (Signed)
Patient refused vital signs 

## 2017-09-04 NOTE — ED Triage Notes (Signed)
Patient was her early this evening. Patient is stating he has a rash on his back that he wants to be looked at. Discoloration saw on back but no bumps.

## 2017-09-04 NOTE — ED Provider Notes (Signed)
[facility]-EMERGENCY DEPT Provider Note   CSN: [id number] Arrival date & time: 09/04/17  0321     History   Chief Complaint Chief Complaint  Patient presents with  . Rash    HPI Bryan Payne is a 22 y.o. male.  Patient presents to the emergency department with a chief complaint of rash.  He is homeless.  He was seen yesterday for heat exposure.  He states that he came back because he wanted to have the rash looked at.  He denies alcohol or drug use.  He denies fevers or chills.  He has not tried treating his rash with anything.  The history is provided by the patient. No language interpreter was used.    Past Medical History:  Diagnosis Date  . Homeless   . Knee derangement 04/2013   left  . Lateral meniscus tear 04/2013   left knee    Patient Active Problem List   Diagnosis Date Noted  . Adjustment disorder with disturbance of emotion 05/01/2017  . Mushroom poisoning 12/22/2014  . Acute renal failure (HCC) 12/22/2014  . 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 Dehydration 12/22/2014  . Ingestion of unknown nonmedicinal substance 12/21/2014  . Rectal bleeding 12/21/2014  . Generalized abdominal pain 12/21/2014  . Lateral meniscus tear 05/05/2013  . Appendicitis, acute-gangrenous s/p laparoscopic appendectomy  10/13/2012    Past Surgical History:  Procedure Laterality Date  . APPENDECTOMY    . KNEE ARTHROSCOPY WITH LATERAL MENISECTOMY Left 05/05/2013   Procedure: LEFT KNEE ARTHROSCOPY WITH LATERAL MENISCAL REPAIR;  Surgeon: Eulas PostJoshua P Landau, MD;  Location: [facility];  Service: Orthopedics;  Laterality: Left;  . LAPAROSCOPIC APPENDECTOMY Right 10/13/2012   Procedure: APPENDECTOMY LAPAROSCOPIC;  Surgeon: Mariella SaaBenjamin T Hoxworth, MD;  Location: [facility];  Service: General;  Laterality: Right;        Home Medications    Prior to Admission medications   Medication Sig Start Date End Date Taking? Authorizing  Provider  selenium sulfide (SELSUN) 1 % LOTN Apply 1 application topically daily. Apply to affected skin morning and evening. 09/04/17   Roxy HorsemanBrowning, Scotlynn Noyes, PA-C    Family History Family History  Problem Relation Age of Onset  . Kidney failure Father        Renal transplant  . Diabetes Other        Family on father's side    Social History Social History   Tobacco Use  . Smoking status: Current Every Day Smoker    Packs/day: 0.50    Types: Cigars, Cigarettes  . Smokeless tobacco: Never Used  Substance Use Topics  . Alcohol use: Yes    Comment: "often"  . Drug use: Yes    Types: Marijuana    Comment: daily     Allergies   Vicks vaporub [camph-eucalypt-men-turp-pet]   Review of Systems Review of Systems  All other systems reviewed and are negative.    Physical Exam Updated Vital Signs BP 108/70 (BP Location: Right Arm)   Pulse (!) 58   Temp 98.3 F (36.8 C) (Oral)   Resp 18   Ht 5\' 6"  (1.676 m)   Wt 62.1 kg (137 lb)   SpO2 100%   BMI 22.11 kg/m   Physical Exam  Constitutional: He is oriented to person, place, and time. He appears well-developed and well-nourished.  HENT:  Head: Normocephalic and atraumatic.  Eyes: Conjunctivae and EOM are normal.  Neck: Normal range of motion.  Cardiovascular: Normal rate.  Pulmonary/Chest:  Effort normal.  Abdominal: He exhibits no distension.  Musculoskeletal: Normal range of motion.  Neurological: He is alert and oriented to person, place, and time.  Skin: Skin is dry.  Scattered light-colored macules on shoulders  Psychiatric: He has a normal mood and affect. His behavior is normal. Judgment and thought content normal.  Nursing note and vitals reviewed.    ED Treatments / Results  Labs (all labs ordered are listed, but only abnormal results are displayed) Labs Reviewed - No data to display  EKG None  Radiology No results found.  Procedures Procedures (including critical care time)  Medications Ordered  in ED Medications - No data to display   Initial Impression / Assessment and Plan / ED Course  I have reviewed the triage vital signs and the nursing notes.  Pertinent labs & imaging results that were available during my care of the patient were reviewed by me and considered in my medical decision making (see chart for details).     Patient with rash consistent with tinea versicolor.  We will treat with selenium sulfide lotion.  Vital signs are stable.  Patient is in no acute distress.  Discharged home.  Final Clinical Impressions(s) / ED Diagnoses   Final diagnoses:  Tinea versicolor    ED Discharge Orders        Ordered    selenium sulfide (SELSUN) 1 % LOTN  Daily     09/04/17 0552       Roxy Horseman, PA-C 09/04/17 0555    Nira Conn, MD 09/05/17 [phone]

## 2017-09-04 NOTE — ED Notes (Signed)
Bed: [facility] Expected date:  Expected time:  Means of arrival:  Comments: 

## 2019-01-01 ENCOUNTER — Encounter (HOSPITAL_COMMUNITY): Payer: Self-pay | Admitting: Emergency Medicine

## 2019-01-01 ENCOUNTER — Emergency Department (HOSPITAL_COMMUNITY)
Admission: EM | Admit: 2019-01-01 | Discharge: 2019-01-01 | Disposition: A | Discharge status: Home / Self Care | Payer: Medicaid Other | Attending: Emergency Medicine | Admitting: Emergency Medicine

## 2019-01-01 ENCOUNTER — Other Ambulatory Visit: Payer: Self-pay

## 2019-01-01 DIAGNOSIS — F32A Depression, unspecified: Secondary | ICD-10-CM

## 2019-01-01 DIAGNOSIS — F329 Major depressive disorder, single episode, unspecified: Secondary | ICD-10-CM

## 2019-01-01 DIAGNOSIS — F17210 Nicotine dependence, cigarettes, uncomplicated: Secondary | ICD-10-CM | POA: Insufficient documentation

## 2019-01-01 DIAGNOSIS — F331 Major depressive disorder, recurrent, moderate: Secondary | ICD-10-CM | POA: Insufficient documentation

## 2019-01-01 DIAGNOSIS — F1220 Cannabis dependence, uncomplicated: Secondary | ICD-10-CM | POA: Insufficient documentation

## 2019-01-01 LAB — CBC WITH DIFFERENTIAL/PLATELET
Abs Immature Granulocytes: 0.04 10*3/uL (ref 0.00–0.07)
Basophils Absolute: 0.1 10*3/uL (ref 0.0–0.1)
Basophils Relative: 1 %
Eosinophils Absolute: 0.4 10*3/uL (ref 0.0–0.5)
Eosinophils Relative: 3 %
HCT: 48.8 % (ref 39.0–52.0)
Hemoglobin: 15.8 g/dL (ref 13.0–17.0)
Immature Granulocytes: 0 %
Lymphocytes Relative: 33 %
Lymphs Abs: 3.9 10*3/uL (ref 0.7–4.0)
MCH: 31.1 pg (ref 26.0–34.0)
MCHC: 32.4 g/dL (ref 30.0–36.0)
MCV: 96.1 fL (ref 80.0–100.0)
Monocytes Absolute: 0.6 10*3/uL (ref 0.1–1.0)
Monocytes Relative: 5 %
Neutro Abs: 6.8 10*3/uL (ref 1.7–7.7)
Neutrophils Relative %: 58 %
Platelets: 279 10*3/uL (ref 150–400)
RBC: 5.08 MIL/uL (ref 4.22–5.81)
RDW: 12.3 % (ref 11.5–15.5)
WBC: 11.8 10*3/uL — ABNORMAL HIGH (ref 4.0–10.5)
nRBC: 0 % (ref 0.0–0.2)

## 2019-01-01 LAB — RAPID URINE DRUG SCREEN, HOSP PERFORMED
Amphetamines: NOT DETECTED
Barbiturates: NOT DETECTED
Benzodiazepines: NOT DETECTED
Cocaine: NOT DETECTED
Opiates: NOT DETECTED
Tetrahydrocannabinol: POSITIVE — AB

## 2019-01-01 LAB — COMPREHENSIVE METABOLIC PANEL
ALT: 25 U/L (ref 0–44)
AST: 23 U/L (ref 15–41)
Albumin: 4.3 g/dL (ref 3.5–5.0)
Alkaline Phosphatase: 36 U/L — ABNORMAL LOW (ref 38–126)
Anion gap: 9 (ref 5–15)
BUN: 18 mg/dL (ref 6–20)
CO2: 27 mmol/L (ref 22–32)
Calcium: 9.6 mg/dL (ref 8.9–10.3)
Chloride: 103 mmol/L (ref 98–111)
Creatinine, Ser: 0.89 mg/dL (ref 0.61–1.24)
GFR calc Af Amer: >60 mL/min (ref >60)
GFR calc non Af Amer: >60 mL/min (ref >60)
Glucose, Bld: 100 mg/dL — ABNORMAL HIGH (ref 70–99)
Potassium: 4.9 mmol/L (ref 3.5–5.1)
Sodium: 139 mmol/L (ref 135–145)
Total Bilirubin: 0.7 mg/dL (ref 0.3–1.2)
Total Protein: 7.2 g/dL (ref 6.5–8.1)

## 2019-01-01 LAB — URINALYSIS, ROUTINE W REFLEX MICROSCOPIC
Bacteria, UA: NONE SEEN
Bilirubin Urine: NEGATIVE
Glucose, UA: NEGATIVE mg/dL
Ketones, ur: NEGATIVE mg/dL
Nitrite: NEGATIVE
Protein, ur: NEGATIVE mg/dL
Specific Gravity, Urine: 1.024 (ref 1.005–1.030)
pH: 6 (ref 5.0–8.0)

## 2019-01-01 LAB — I-STAT CHEM 8, ED
BUN: 18 mg/dL (ref 6–20)
Calcium, Ion: 1.22 mmol/L (ref 1.15–1.40)
Chloride: 101 mmol/L (ref 98–111)
Creatinine, Ser: 0.9 mg/dL (ref 0.61–1.24)
Glucose, Bld: 93 mg/dL (ref 70–99)
HCT: 49 % (ref 39.0–52.0)
Hemoglobin: 16.7 g/dL (ref 13.0–17.0)
Potassium: 4.8 mmol/L (ref 3.5–5.1)
Sodium: 139 mmol/L (ref 135–145)
TCO2: 26 mmol/L (ref 22–32)

## 2019-01-01 LAB — SALICYLATE LEVEL: Salicylate Lvl: <7 mg/dL (ref 2.8–30.0)

## 2019-01-01 LAB — ETHANOL: Alcohol, Ethyl (B): <10 mg/dL (ref <10)

## 2019-01-01 LAB — ACETAMINOPHEN LEVEL: Acetaminophen (Tylenol), Serum: <10 ug/mL — ABNORMAL LOW (ref 10–30)

## 2019-01-01 NOTE — ED Notes (Signed)
1 bag of belongings returned to pt

## 2019-01-01 NOTE — BH Assessment (Signed)
Tele Assessment Note   Patient Name: Bryan Payne MRN: [id number] Referring Physician: Raye Sorrow Location of Patient: [facility] Location of Provider: [place]  Bryan Payne is an 23 y.o. male. Per EDP note, "Patient is a 23 year old male with past medical history of adjustment disorder presenting with complaints of behavioral health issues.  He tells me that he feels as though the "entire world is wrong", and things seem out of place.  Apparently there was some sort of incident at a local convenience store prior to his coming here.  He feels like he is depressed and needs to talk to anyone who will listen.  He denies to me that he actively suicidal or homicidal.  He denies any auditory or visual hallucinations. It would appear as though he is homeless.  He does tell me that he was most recently living with a friend on his couch.  He drug or alcohol use to me."   During assessment pt presented alert but laying down.  Pt was oriented x4.Pt speech logical and coherent. Pt mood was sad and depressed as well as affect. Eye contact was fair, motor activity normal and thought process coherent and relevant. Judgment partial. Pt was cooperative during assessment.  No indication he is responding to internal stimuli or delusions. Pt states he is here at the ED because , " It was too cold for me to be outside, I went to a store and became aggravated and started arguing with people and yelling at them for no reason". Pt states that he is currently homeless and only temporarily employed. Pt states that he has been homeless for awhile and does not have family support and that no one is considerate of him and how he is trying to get help. Pt denied SI, HI, AVH, but did admit to current substance abuse of marijuana. Pt denied current or past history of abuse/neglect or family mental health history. Pt reports getting little sleep and having a poor appetite. Pt has no provider and is not  on any psychiatric medications. Pt also has no inpatient treatment history. Pt admitted to feeling hopeless, depressed and anxious. Pt states he has tried to seek outpatient resources but they never follow back up with him. Pt stated that he simply was just "cold" and not seeking any other services or help at this time.  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 Homeless   . Knee derangement 04/2013   left  . Lateral meniscus tear 04/2013   left knee    Past Surgical History:  Procedure Laterality Date  . APPENDECTOMY    . KNEE ARTHROSCOPY WITH LATERAL MENISECTOMY Left 05/05/2013   Procedure: LEFT KNEE ARTHROSCOPY WITH LATERAL MENISCAL REPAIR;  Surgeon: Johnny Bridge, MD;  Location: [facility];  Service: Orthopedics;  Laterality: Left;  . LAPAROSCOPIC APPENDECTOMY Right 10/13/2012   Procedure: APPENDECTOMY LAPAROSCOPIC;  Surgeon: Edward Jolly, MD;  Location: [facility];  Service: General;  Laterality: Right;    Family History:  Family History  Problem Relation Age of Onset  . Kidney failure Father        Renal transplant  . Diabetes Other        Family on father's side    Social History:  reports that he has been smoking cigars and cigarettes. He has been smoking about 0.50 packs per day. He has never used  smokeless tobacco. He reports current alcohol use. He reports current drug use. Drug: Marijuana.  Additional Social History:  Alcohol / Drug Use Pain Medications: see MAR Prescriptions: see MAR Over the Counter: see MAR History of alcohol / drug use?: Yes  CIWA: CIWA-Ar BP: 115/70 Pulse Rate: 84 COWS:    Allergies:  Allergies  Allergen Reactions  . Vicks Vaporub [Camph-Eucalypt-Men-Turp-Pet] Other (See Comments)    Caused eye irritation and shortness of breath    Home Medications: (Not in a hospital admission)   OB/GYN Status:  No LMP for male  patient.  General Assessment Data [facility]: [facility] TTS Assessment: In system Is this a Tele or Face-to-Face Assessment?: Tele Assessment Is this an Initial Assessment or a Re-assessment for this encounter?: Initial Assessment Patient Accompanied by:: N/A Language Other than English: No Living Arrangements: Homeless/Shelter What gender do you identify as?: Male Marital status: Other (comment) Living Arrangements: Alone Can pt return to current living arrangement?: Yes Admission Status: Voluntary Is patient capable of signing voluntary admission?: Yes Referral Source: Self/Family/Friend Insurance type: none     Crisis Care Plan Living Arrangements: Alone  Education Status Is patient currently in school?: No  Risk to self with the past 6 months Suicidal Ideation: No Has patient been a risk to self within the past 6 months prior to admission? : No Suicidal Intent: No Has patient had any suicidal intent within the past 6 months prior to admission? : No Is patient at risk for suicide?: No Suicidal Plan?: No Has patient had any suicidal plan within the past 6 months prior to admission? : No Access to Means: No What has been your use of drugs/alcohol within the last 12 months?: marijuana Previous Attempts/Gestures: No Other Self Harm Risks: none Triggers for Past Attempts: Unknown Intentional Self Injurious Behavior: None Family Suicide History: No Recent stressful life event(s): Other (Comment) Persecutory voices/beliefs?: No Depression: Yes Depression Symptoms: Insomnia, Feeling angry/irritable, Feeling worthless/self pity Substance abuse history and/or treatment for substance abuse?: Yes Suicide prevention information given to non-admitted patients: Not applicable  Risk to Others within the past 6 months Homicidal Ideation: No Does patient have any lifetime risk of violence toward others beyond the six months prior to admission? : No Thoughts of Harm to  Others: No Current Homicidal Intent: No Current Homicidal Plan: No Access to Homicidal Means: No Identified Victim: none History of harm to others?: No Assessment of Violence: None Noted Violent Behavior Description: none Does patient have access to weapons?: No Criminal Charges Pending?: No Does patient have a court date: No Is patient on probation?: Yes  Psychosis Hallucinations: None noted Delusions: None noted  Mental Status Report Appearance/Hygiene: Unremarkable Eye Contact: Fair Motor Activity: Freedom of movement Speech: Logical/coherent Level of Consciousness: Quiet/awake Mood: Sad Anxiety Level: Minimal Thought Processes: Coherent, Relevant Judgement: Partial Orientation: Person, Place, Time, Situation Obsessive Compulsive Thoughts/Behaviors: None  Cognitive Functioning Concentration: Fair Memory: Recent Intact Is patient IDD: No Insight: Poor Impulse Control: Poor Appetite: Poor Have you had any weight changes? : No Change Sleep: Decreased Total Hours of Sleep: 3 Vegetative Symptoms: None  ADLScreening [facility] Assessment Services) Patient's cognitive ability adequate to safely complete daily activities?: Yes Patient able to express need for assistance with ADLs?: Yes Independently performs ADLs?: Yes (appropriate for developmental age)  Prior Inpatient Therapy Prior Inpatient Therapy: No  Prior Outpatient Therapy Prior Outpatient Therapy: No Does patient have an ACCT team?: No Does patient have Intensive In-House Services?  : No Does patient have  [facility] services? : No Does patient have P4CC services?: No  ADL Screening (condition at time of admission) Patient's cognitive ability adequate to safely complete daily activities?: Yes Patient able to express need for assistance with ADLs?: Yes Independently performs ADLs?: Yes (appropriate for developmental age)             Advance Directives (For Healthcare) Does Patient Have a Medical Advance  Directive?: No          Disposition: Nira Conn, FNP recommends patient does not meet inpatient criteria. TTS confirmed status with attending provide. Disposition Initial Assessment Completed for this Encounter: Yes  This service was provided via telemedicine using a 2-way, interactive audio and video technology.  Names of all persons participating in this telemedicine service and their role in this encounter. Name: Winter Trefz Role: Patient  Name: Lacey Jensen Role: TTS Counselor  Name:  Role:   Name:  Role:     Natasha Mead 01/01/2019 6:30 AM

## 2019-01-01 NOTE — Discharge Instructions (Addendum)
Follow-up with outpatient counseling as recommended by TTS representatives.
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
## 2019-01-14 ENCOUNTER — Other Ambulatory Visit: Payer: Self-pay

## 2019-01-14 ENCOUNTER — Emergency Department (HOSPITAL_COMMUNITY)
Admission: EM | Admit: 2019-01-14 | Discharge: 2019-01-14 | Disposition: A | Discharge status: Home / Self Care | Payer: Medicaid Other | Attending: Emergency Medicine | Admitting: Emergency Medicine

## 2019-01-14 ENCOUNTER — Encounter (HOSPITAL_COMMUNITY): Payer: Self-pay

## 2019-01-14 DIAGNOSIS — R209 Unspecified disturbances of skin sensation: Secondary | ICD-10-CM | POA: Insufficient documentation

## 2019-01-14 DIAGNOSIS — Z5900 Homelessness unspecified: Secondary | ICD-10-CM

## 2019-01-14 DIAGNOSIS — F17210 Nicotine dependence, cigarettes, uncomplicated: Secondary | ICD-10-CM | POA: Insufficient documentation

## 2019-01-14 NOTE — ED Triage Notes (Signed)
Pt states that he is here because he was freezing last night. Pt states that he always thinks he has a good plan, but then he doesn't have somewhere to go. Pt states his emergency contact, whom is a family member, maybe could help him but he cannot count on that person.  Pt denies SI and HI. Pt states he just needs help figuring out his life plan, but wants to live.
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
## 2019-01-14 NOTE — ED Notes (Signed)
RN attempted to call contact Jaquelyn Bitter no answer and unable to leave message, to ask to pick pt up .

## 2019-01-14 NOTE — Discharge Instructions (Addendum)
Please follow up with provider at [facility].  Go to [facility] as walk in for a prompt in person emergency assessment as needed  Return to ER for suicidal thoughts or plan or attempt, thoughts or plans to hurt others, hallucinations   As best you can, keep your feet covered with several socks, avoid moisture.  Return for skin redness, swelling, warmth, lesions, pus, fevers, calf pain or swelling
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
## 2019-09-25 ENCOUNTER — Emergency Department (HOSPITAL_COMMUNITY)
Admission: EM | Admit: 2019-09-25 | Discharge: 2019-09-26 | Disposition: A | Discharge status: Home / Self Care | Payer: Self-pay | Attending: Emergency Medicine | Admitting: Emergency Medicine

## 2019-09-25 ENCOUNTER — Emergency Department (HOSPITAL_COMMUNITY): Payer: Self-pay

## 2019-09-25 ENCOUNTER — Encounter (HOSPITAL_COMMUNITY): Payer: Self-pay

## 2019-09-25 ENCOUNTER — Other Ambulatory Visit: Payer: Self-pay

## 2019-09-25 DIAGNOSIS — M79672 Pain in left foot: Secondary | ICD-10-CM | POA: Insufficient documentation

## 2019-09-25 DIAGNOSIS — Z5321 Procedure and treatment not carried out due to patient leaving prior to being seen by health care provider: Secondary | ICD-10-CM | POA: Insufficient documentation

## 2019-09-25 DIAGNOSIS — M79671 Pain in right foot: Secondary | ICD-10-CM | POA: Insufficient documentation

## 2019-09-25 NOTE — ED Triage Notes (Signed)
Pt sts bilateral foot pain and walking several miles today.

## 2019-11-27 ENCOUNTER — Emergency Department (HOSPITAL_COMMUNITY)
Admission: EM | Admit: 2019-11-27 | Discharge: 2019-11-27 | Disposition: A | Discharge status: Home / Self Care | Payer: Medicaid Other | Attending: Emergency Medicine | Admitting: Emergency Medicine

## 2019-11-27 ENCOUNTER — Other Ambulatory Visit: Payer: Self-pay

## 2019-11-27 DIAGNOSIS — F10129 Alcohol abuse with intoxication, unspecified: Secondary | ICD-10-CM | POA: Insufficient documentation

## 2019-11-27 DIAGNOSIS — Z5321 Procedure and treatment not carried out due to patient leaving prior to being seen by health care provider: Secondary | ICD-10-CM | POA: Insufficient documentation

## 2019-11-27 DIAGNOSIS — R739 Hyperglycemia, unspecified: Secondary | ICD-10-CM | POA: Insufficient documentation

## 2019-11-27 DIAGNOSIS — J029 Acute pharyngitis, unspecified: Secondary | ICD-10-CM | POA: Insufficient documentation

## 2019-11-27 LAB — URINALYSIS, ROUTINE W REFLEX MICROSCOPIC
Bilirubin Urine: NEGATIVE
Glucose, UA: NEGATIVE mg/dL
Hgb urine dipstick: NEGATIVE
Ketones, ur: NEGATIVE mg/dL
Leukocytes,Ua: NEGATIVE
Nitrite: NEGATIVE
Protein, ur: NEGATIVE mg/dL
Specific Gravity, Urine: 1.02 (ref 1.005–1.030)
pH: 7 (ref 5.0–8.0)

## 2019-11-27 LAB — CBC
HCT: 44.5 % (ref 39.0–52.0)
Hemoglobin: 14.8 g/dL (ref 13.0–17.0)
MCH: 31.6 pg (ref 26.0–34.0)
MCHC: 33.3 g/dL (ref 30.0–36.0)
MCV: 95.1 fL (ref 80.0–100.0)
Platelets: 245 10*3/uL (ref 150–400)
RBC: 4.68 MIL/uL (ref 4.22–5.81)
RDW: 12.5 % (ref 11.5–15.5)
WBC: 11 10*3/uL — ABNORMAL HIGH (ref 4.0–10.5)
nRBC: 0 % (ref 0.0–0.2)

## 2019-11-27 LAB — BASIC METABOLIC PANEL
Anion gap: 11 (ref 5–15)
BUN: 14 mg/dL (ref 6–20)
CO2: 26 mmol/L (ref 22–32)
Calcium: 8.9 mg/dL (ref 8.9–10.3)
Chloride: 105 mmol/L (ref 98–111)
Creatinine, Ser: 1.03 mg/dL (ref 0.61–1.24)
GFR, Estimated: >60 mL/min (ref >60)
Glucose, Bld: 106 mg/dL — ABNORMAL HIGH (ref 70–99)
Potassium: 3.9 mmol/L (ref 3.5–5.1)
Sodium: 142 mmol/L (ref 135–145)

## 2019-11-27 LAB — CBG MONITORING, ED: Glucose-Capillary: 106 mg/dL — ABNORMAL HIGH (ref 70–99)

## 2019-11-27 NOTE — ED Triage Notes (Addendum)
Patient reports to the ER intoxicated and with low blood sugar. Patient is coming from [place] [address].  Patient's CBG was 56 with EMS and is not 74 after oral glucose.  Patient also reports he may have COVID as he has sore throat, congestion.

## 2019-11-28 ENCOUNTER — Emergency Department (HOSPITAL_COMMUNITY)
Admission: EM | Admit: 2019-11-28 | Discharge: 2019-11-28 | Discharge status: Against Medical Advice | Payer: Medicaid Other

## 2019-12-15 ENCOUNTER — Other Ambulatory Visit: Payer: Self-pay

## 2019-12-15 ENCOUNTER — Emergency Department (HOSPITAL_COMMUNITY)
Admission: EM | Admit: 2019-12-15 | Discharge: 2019-12-15 | Disposition: A | Discharge status: Home / Self Care | Payer: Medicaid Other | Attending: Emergency Medicine | Admitting: Emergency Medicine

## 2019-12-15 DIAGNOSIS — M79671 Pain in right foot: Secondary | ICD-10-CM | POA: Insufficient documentation

## 2019-12-15 DIAGNOSIS — T699XXA Effect of reduced temperature, unspecified, initial encounter: Secondary | ICD-10-CM

## 2019-12-15 DIAGNOSIS — F17210 Nicotine dependence, cigarettes, uncomplicated: Secondary | ICD-10-CM | POA: Insufficient documentation

## 2019-12-15 DIAGNOSIS — M79672 Pain in left foot: Secondary | ICD-10-CM | POA: Insufficient documentation

## 2019-12-15 DIAGNOSIS — X31XXXA Exposure to excessive natural cold, initial encounter: Secondary | ICD-10-CM | POA: Insufficient documentation

## 2019-12-15 NOTE — ED Triage Notes (Signed)
Patient here from outside of an extended stay with c/o left foot pain.

## 2019-12-15 NOTE — ED Provider Notes (Signed)
[facility]-EMERGENCY DEPT Provider Note  CSN: [id number] Arrival date & time: 12/15/19 0443  Chief Complaint(s) Foot Pain (Patient c/o left foot pain.)  HPI Bryan Payne is a 24 y.o. male brought in by EMS for exposure to the cold.  Patient also complaining of left foot pain.  Reports that he was walking to his father's house but in the middle of his walk he decided not to go.  He states that he laid down on a bench and thought he was going to be there just for 5 minutes but got "tailbone."  When he awoke he felt himself shivering.  Patient denies any trauma.  Denies any other physical complaints.  Requesting something to drink, stating that he is willing to pay for it.  HPI  Past Medical History Past Medical History:  Diagnosis Date  . Homeless   . Knee derangement 04/2013   left  . Lateral meniscus tear 04/2013   left knee   Patient Active Problem List   Diagnosis Date Noted  . Adjustment disorder with disturbance of emotion 05/01/2017  . Mushroom poisoning 12/22/2014  . Acute renal failure (HCC) 12/22/2014  . 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 Dehydration 12/22/2014  . Ingestion of unknown nonmedicinal substance 12/21/2014  . Rectal bleeding 12/21/2014  . Generalized abdominal pain 12/21/2014  . Lateral meniscus tear 05/05/2013  . Appendicitis, acute-gangrenous s/p laparoscopic appendectomy  10/13/2012   Home Medication(s) Prior to Admission medications   Medication Sig Start Date End Date Taking? Authorizing Provider  selenium sulfide (SELSUN) 1 % LOTN Apply 1 application topically daily. Apply to affected skin morning and evening. Patient not taking: Reported on 01/01/2019 09/04/17   Roxy Horseman, PA-C                                                                                                                                    Past Surgical History Past Surgical History:  Procedure Laterality Date  .  APPENDECTOMY    . KNEE ARTHROSCOPY WITH LATERAL MENISECTOMY Left 05/05/2013   Procedure: LEFT KNEE ARTHROSCOPY WITH LATERAL MENISCAL REPAIR;  Surgeon: Eulas Post, MD;  Location: [facility];  Service: Orthopedics;  Laterality: Left;  . LAPAROSCOPIC APPENDECTOMY Right 10/13/2012   Procedure: APPENDECTOMY LAPAROSCOPIC;  Surgeon: Mariella Saa, MD;  Location: [facility];  Service: General;  Laterality: Right;   Family History Family History  Problem Relation Age of Onset  . Kidney failure Father        Renal transplant  . Diabetes Other        Family on father's side    Social History Social History   Tobacco Use  . Smoking status: Current Every Day Smoker    Packs/day: 0.50    Types: Cigars, Cigarettes  . Smokeless tobacco: Never Used  Vaping Use  . Vaping Use: Never used  Substance Use Topics  . Alcohol  use: Yes    Comment: "often"  . Drug use: Yes    Types: Marijuana    Comment: daily   Allergies Vicks vaporub [camph-eucalypt-men-turp-pet]  Review of Systems Review of Systems All other systems are reviewed and are negative for acute change except as noted in the HPI  Physical Exam Vital Signs  I have reviewed the triage vital signs BP 126/61 (BP Location: Right Arm)   Pulse 70   Temp 97.9 F (36.6 C) (Oral)   Resp 16   SpO2 100%   Physical Exam Vitals reviewed.  Constitutional:      General: He is not in acute distress.    Appearance: He is well-developed. He is not diaphoretic.  HENT:     Head: Normocephalic and atraumatic.     Nose: Nose normal.  Eyes:     General: No scleral icterus.       Right eye: No discharge.        Left eye: No discharge.     Conjunctiva/sclera: Conjunctivae normal.     Pupils: Pupils are equal, round, and reactive to light.  Cardiovascular:     Rate and Rhythm: Normal rate and regular rhythm.     Heart sounds: No murmur heard.  No friction rub. No gallop.   Pulmonary:     Effort: Pulmonary effort is  normal. No respiratory distress.     Breath sounds: Normal breath sounds. No stridor. No rales.  Abdominal:     General: There is no distension.     Palpations: Abdomen is soft.     Tenderness: There is no abdominal tenderness.  Musculoskeletal:     Cervical back: Normal range of motion and neck supple.     Right lower leg: No tenderness.     Left lower leg: No tenderness.     Right foot: Tenderness present.     Left foot: Tenderness present.  Skin:    General: Skin is warm and dry.     Findings: No erythema or rash.  Neurological:     Mental Status: He is alert and oriented to person, place, and time.     ED Results and Treatments Labs (all labs ordered are listed, but only abnormal results are displayed) Labs Reviewed - No data to display                                                                                                                       EKG  EKG Interpretation  Date/Time:    Ventricular Rate:    PR Interval:    QRS Duration:   QT Interval:    QTC Calculation:   R Axis:     Text Interpretation:        Radiology No results found.  Pertinent labs & imaging results that were available during my care of the patient were reviewed by me and considered in my medical decision making (see chart for details).  Medications Ordered in ED Medications - No data to  display                                                                                                                                  Procedures Procedures  (including critical care time)  Medical Decision Making / ED Course I have reviewed the nursing notes for this encounter and the patient's prior records (if available in EHR or on provided paperwork).   Bryan Payne was evaluated in Emergency Department on 12/15/2019 for the symptoms described in the history of present illness. He was evaluated in the context of the global COVID-19 pandemic, which necessitated consideration that the  patient might be at risk for infection with the SARS-CoV-2 virus that causes COVID-19. Institutional protocols and algorithms that pertain to the evaluation of patients at risk for COVID-19 are in a state of rapid change based on information released by regulatory bodies including the CDC and federal and state organizations. These policies and algorithms were followed during the patient's care in the ED.    Clinical Course as of Dec 14 637  Caleen Essex Dec 15, 2019  0532 No tenderness to palpation of the left foot and patient denies trauma. No indication for imaging.  Patient provided with warm blankets and meal. Will allow him to stay in the ED for the remainder of the night.    [PC]    Clinical Course User Index [PC] Tynell Winchell, Amadeo Garnet, MD     Final Clinical Impression(s) / ED Diagnoses Final diagnoses:  Exposure to environmental cold, initial encounter    The patient appears reasonably screened and/or stabilized for discharge and I doubt any other medical condition or other [facility] requiring further screening, evaluation, or treatment in the ED at this time prior to discharge. Safe for discharge with strict return precautions.  Disposition: Discharge  Condition: Good  I have discussed the results, Dx and Tx plan with the patient/family who expressed understanding and agree(s) with the plan. Discharge instructions discussed at length. The patient/family was given strict return precautions who verbalized understanding of the instructions. No further questions at time of discharge.    ED Discharge Orders    None       Follow Up: Primary care provider  Schedule an appointment as soon as possible for a visit       This chart was dictated using voice recognition software.  Despite best efforts to proofread,  errors can occur which can change the documentation meaning.   Nira Conn, MD 12/15/19 [phone]

## 2020-01-08 ENCOUNTER — Other Ambulatory Visit: Payer: Self-pay

## 2020-01-08 DIAGNOSIS — X31XXXA Exposure to excessive natural cold, initial encounter: Secondary | ICD-10-CM | POA: Insufficient documentation

## 2020-01-08 DIAGNOSIS — F17210 Nicotine dependence, cigarettes, uncomplicated: Secondary | ICD-10-CM | POA: Insufficient documentation

## 2020-01-08 DIAGNOSIS — R6883 Chills (without fever): Secondary | ICD-10-CM | POA: Insufficient documentation

## 2020-01-08 NOTE — ED Triage Notes (Signed)
Patient arrived via [facility] with complaints of being cold. No other complaints at this time.

## 2020-01-09 ENCOUNTER — Encounter (HOSPITAL_COMMUNITY): Payer: Self-pay

## 2020-01-09 ENCOUNTER — Emergency Department (HOSPITAL_COMMUNITY)
Admission: EM | Admit: 2020-01-09 | Discharge: 2020-01-09 | Disposition: A | Discharge status: Home / Self Care | Payer: Medicaid Other | Attending: Emergency Medicine | Admitting: Emergency Medicine

## 2020-01-09 DIAGNOSIS — T699XXA Effect of reduced temperature, unspecified, initial encounter: Secondary | ICD-10-CM

## 2020-01-09 NOTE — ED Provider Notes (Signed)
[facility]   CSN: [id number] Arrival date & time: 01/08/20  2355     History Chief Complaint  Patient presents with  . Cold Exposure    KAMARION ZAGAMI is a 24 y.o. male.  HPI      JOHAAN RYSER is a 24 y.o. male, with a history of homelessness, presenting to the ED due to feeling cold.    Patient states, "I was kicked out of my house, it was cold outside, and I did not know where else to go so I came to the hospital."    Denies illness, injuries, dizziness, numbness, weakness, chest pain, shortness of breath, or any other complaints.    Past Medical History:  Diagnosis Date  . Homeless   . Knee derangement 04/2013   left  . Lateral meniscus tear 04/2013   left knee    Patient Active Problem List   Diagnosis Date Noted  . Adjustment disorder with disturbance of emotion 05/01/2017  . Mushroom poisoning 12/22/2014  . Acute renal failure (HCC) 12/22/2014  . 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 Dehydration 12/22/2014  . Ingestion of unknown nonmedicinal substance 12/21/2014  . Rectal bleeding 12/21/2014  . Generalized abdominal pain 12/21/2014  . Lateral meniscus tear 05/05/2013  . Appendicitis, acute-gangrenous s/p laparoscopic appendectomy  10/13/2012    Past Surgical History:  Procedure Laterality Date  . APPENDECTOMY    . KNEE ARTHROSCOPY WITH LATERAL MENISECTOMY Left 05/05/2013   Procedure: LEFT KNEE ARTHROSCOPY WITH LATERAL MENISCAL REPAIR;  Surgeon: Eulas Post, MD;  Location: [facility];  Service: Orthopedics;  Laterality: Left;  . LAPAROSCOPIC APPENDECTOMY Right 10/13/2012   Procedure: APPENDECTOMY LAPAROSCOPIC;  Surgeon: Mariella Saa, MD;  Location: [facility];  Service: General;  Laterality: Right;       Family History  Problem Relation Age of Onset  . Kidney failure Father        Renal transplant  . Diabetes Other        Family on father's  side    Social History   Tobacco Use  . Smoking status: Current Every Day Smoker    Packs/day: 0.50    Types: Cigars, Cigarettes  . Smokeless tobacco: Never Used  Vaping Use  . Vaping Use: Never used  Substance Use Topics  . Alcohol use: Yes    Comment: "often"  . Drug use: Yes    Types: Marijuana    Comment: daily    Home Medications Prior to Admission medications   Medication Sig Start Date End Date Taking? Authorizing Provider  selenium sulfide (SELSUN) 1 % LOTN Apply 1 application topically daily. Apply to affected skin morning and evening. Patient not taking: Reported on 01/01/2019 09/04/17   Roxy Horseman, PA-C    Allergies    Vicks vaporub [camph-eucalypt-men-turp-pet]  Review of Systems   Review of Systems  Constitutional: Negative for fever.  Respiratory: Negative for cough and shortness of breath.   Cardiovascular: Negative for chest pain.  Gastrointestinal: Negative for nausea and vomiting.  Neurological: Negative for weakness and numbness.  All other systems reviewed and are negative.   Physical Exam Updated Vital Signs BP 111/65 (BP Location: Right Arm)   Pulse (!) 51   Temp 97.9 F (36.6 C) (Oral)   Resp 15   SpO2 99%   Physical Exam Vitals and nursing note reviewed.  Constitutional:      General: He is not in acute distress.  Appearance: He is well-developed. He is not diaphoretic.  HENT:     Head: Normocephalic and atraumatic.     Mouth/Throat:     Mouth: Mucous membranes are moist.     Pharynx: Oropharynx is clear.  Eyes:     Conjunctiva/sclera: Conjunctivae normal.  Cardiovascular:     Rate and Rhythm: Normal rate and regular rhythm.     Pulses: Normal pulses.          Radial pulses are 2+ on the right side and 2+ on the left side.       Posterior tibial pulses are 2+ on the right side and 2+ on the left side.     Heart sounds: Normal heart sounds.     Comments: Tactile temperature in the extremities appropriate and equal  bilaterally. Pulmonary:     Effort: Pulmonary effort is normal. No respiratory distress.     Breath sounds: Normal breath sounds.  Abdominal:     Palpations: Abdomen is soft.     Tenderness: There is no abdominal tenderness. There is no guarding.  Musculoskeletal:     Cervical back: Neck supple.     Right lower leg: No edema.     Left lower leg: No edema.     Comments: Patient's fingers and toes are appropriately warm to the touch.  Full flexion and extension intact in the fingers and toes.  No tenderness, erythema, other discoloration, or swelling noted.  Skin:    General: Skin is warm and dry.  Neurological:     Mental Status: He is alert and oriented to person, place, and time.     Comments: Sensation light touch grossly intact in the fingers and toes. Grip strength equal bilaterally. Strength 5/5 in each of the extremities.  Psychiatric:        Mood and Affect: Mood and affect normal.        Speech: Speech normal.        Behavior: Behavior normal.     ED Results / Procedures / Treatments   Labs (all labs ordered are listed, but only abnormal results are displayed) Labs Reviewed - No data to display  EKG None  Radiology No results found.  Procedures Procedures (including critical care time)  Medications Ordered in ED Medications - No data to display  ED Course  I have reviewed the triage vital signs and the nursing notes.  Pertinent labs & imaging results that were available during my care of the patient were reviewed by me and considered in my medical decision making (see chart for details).    MDM Rules/Calculators/A&P                           Patient presents to the ED stating he did not want to be out on a cold.  No acute neurologic abnormalities noted.  No evidence to suggest frostbite. Patient given information regarding resources and shelters.   Final Clinical Impression(s) / ED Diagnoses Final diagnoses:  Cold exposure, initial encounter    Rx  / DC Orders ED Discharge Orders    None       Concepcion Living 01/09/20 1627    Pricilla Loveless, MD 01/15/20 2025

## 2020-01-09 NOTE — Discharge Instructions (Addendum)
Take extra care to find a sheltered area, especially in cold. Refer to the attached resources.

## 2020-01-23 ENCOUNTER — Other Ambulatory Visit: Payer: Self-pay

## 2020-01-23 ENCOUNTER — Emergency Department (HOSPITAL_COMMUNITY)
Admission: EM | Admit: 2020-01-23 | Discharge: 2020-01-23 | Disposition: A | Discharge status: Home / Self Care | Payer: Medicaid Other | Attending: Emergency Medicine | Admitting: Emergency Medicine

## 2020-01-23 ENCOUNTER — Encounter (HOSPITAL_COMMUNITY): Payer: Self-pay

## 2020-01-23 DIAGNOSIS — Z742 Need for assistance at home and no other household member able to render care: Secondary | ICD-10-CM | POA: Insufficient documentation

## 2020-01-23 DIAGNOSIS — F17210 Nicotine dependence, cigarettes, uncomplicated: Secondary | ICD-10-CM | POA: Insufficient documentation

## 2020-01-23 DIAGNOSIS — Z789 Other specified health status: Secondary | ICD-10-CM

## 2020-01-23 NOTE — ED Provider Notes (Signed)
[name] Provider Note   CSN: [id number] Arrival date & time: 01/23/20  0424     History Chief Complaint  Patient presents with  . Homeless    Bryan Payne is a 24 y.o. male.  Patient has no report of illness or injury, he was sent here for resources, he has homeless and was sleeping in a laundromat someone woke him up and told him to leave.        Past Medical History:  Diagnosis Date  . Homeless   . Knee derangement 04/2013   left  . Lateral meniscus tear 04/2013   left knee    Patient Active Problem List   Diagnosis Date Noted  . Adjustment disorder with disturbance of emotion 05/01/2017  . Mushroom poisoning 12/22/2014  . Acute renal failure (HCC) 12/22/2014  . 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 Dehydration 12/22/2014  . Ingestion of unknown nonmedicinal substance 12/21/2014  . Rectal bleeding 12/21/2014  . Generalized abdominal pain 12/21/2014  . Lateral meniscus tear 05/05/2013  . Appendicitis, acute-gangrenous s/p laparoscopic appendectomy  10/13/2012    Past Surgical History:  Procedure Laterality Date  . APPENDECTOMY    . KNEE ARTHROSCOPY WITH LATERAL MENISECTOMY Left 05/05/2013   Procedure: LEFT KNEE ARTHROSCOPY WITH LATERAL MENISCAL REPAIR;  Surgeon: Bryan Post, MD;  Location: [facility];  Service: Orthopedics;  Laterality: Left;  . LAPAROSCOPIC APPENDECTOMY Right 10/13/2012   Procedure: APPENDECTOMY LAPAROSCOPIC;  Surgeon: Bryan Saa, MD;  Location: [facility];  Service: General;  Laterality: Right;       Family History  Problem Relation Age of Onset  . Kidney failure Father        Renal transplant  . Diabetes Other        Family on father's side    Social History   Tobacco Use  . Smoking status: Current Every Day Smoker    Packs/day: 0.50    Types: Cigars, Cigarettes  . Smokeless tobacco: Never Used  Vaping Use  . Vaping Use: Never used   Substance Use Topics  . Alcohol use: Yes    Comment: "often"  . Drug use: Yes    Types: Marijuana    Comment: daily    Home Medications Prior to Admission medications   Medication Sig Start Date End Date Taking? Authorizing Provider  selenium sulfide (SELSUN) 1 % LOTN Apply 1 application topically daily. Apply to affected skin morning and evening. Patient not taking: Reported on 01/01/2019 09/04/17   Bryan Horseman, PA-C    Allergies    Vicks vaporub [camph-eucalypt-men-turp-pet]  Review of Systems   Review of Systems  Constitutional: Negative for chills and fever.  HENT: Negative for congestion and rhinorrhea.   Respiratory: Negative for cough and shortness of breath.   Cardiovascular: Negative for chest pain and palpitations.  Gastrointestinal: Negative for diarrhea, nausea and vomiting.  Genitourinary: Negative for difficulty urinating and dysuria.  Musculoskeletal: Negative for arthralgias and back pain.  Skin: Negative for color change and rash.  Neurological: Negative for light-headedness and headaches.    Physical Exam Updated Vital Signs BP 130/77 (BP Location: Left Arm)   Pulse 60   Temp 98 F (36.7 C) (Oral)   Resp 14   Ht 5\' 7"  (1.702 m)   Wt 59 kg   SpO2 100%   BMI 20.36 kg/m   Physical Exam Vitals and nursing note reviewed.  Constitutional:      General: He is not in  acute distress.    Appearance: Normal appearance.  HENT:     Head: Normocephalic and atraumatic.     Nose: No rhinorrhea.  Eyes:     General:        Right eye: No discharge.        Left eye: No discharge.     Conjunctiva/sclera: Conjunctivae normal.  Cardiovascular:     Rate and Rhythm: Normal rate and regular rhythm.  Pulmonary:     Effort: Pulmonary effort is normal.     Breath sounds: No stridor.  Abdominal:     General: Abdomen is flat. There is no distension.     Palpations: Abdomen is soft.  Musculoskeletal:        General: No deformity or signs of injury.  Skin:     General: Skin is warm and dry.  Neurological:     General: No focal deficit present.     Mental Status: He is alert. Mental status is at baseline.     Motor: No weakness.  Psychiatric:        Mood and Affect: Mood normal.        Behavior: Behavior normal.        Thought Content: Thought content normal.     ED Results / Procedures / Treatments   Labs (all labs ordered are listed, but only abnormal results are displayed) Labs Reviewed - No data to display  EKG None  Radiology No results found.  Procedures Procedures (including critical care time)  Medications Ordered in ED Medications - No data to display  ED Course  I have reviewed the triage vital signs and the nursing notes.  Pertinent labs & imaging results that were available during my care of the patient were reviewed by me and considered in my medical decision making (see chart for details).    MDM Rules/Calculators/A&P                          Patient is overall well-appearing with no complaint of injury or illness.  I detect no emergent conditions.  He is simply asking for resources for the community regarding homelessness.  He is provided this.  Return precautions are provided. Final Clinical Impression(s) / ED Diagnoses Final diagnoses:  Needs assistance with community resources    Rx / DC Orders ED Discharge Orders    None       Bryan Donovan, MD 01/23/20 [phone]

## 2020-01-23 NOTE — ED Triage Notes (Signed)
Pt sts he is unable to get sleep because they made him leave the laundry mat.

## 2020-01-23 NOTE — Discharge Instructions (Addendum)
  HOMELESS SHELTERS  [facility]     [facility]   [address], [place]     [phone]              [employer] (women and children)       [address] [place], [place] [address] [phone] [email or web address] for application and process Application Required  [facility]   [address]    [place] [address]     [phone]                    [facility] [address] [place], [place] [address] [phone] application appt.) Application Required  [facility] (women only)    [address]     [place], [place] [address]     [phone]      Intake starts 6pm daily Need valid ID, SSC, & Police report [employer] [address] [place], [place] [phone] Application Required  [employer] (men only)     [address]      [place], [place]     [phone]       [facility] [place] (Pregnant women only) [address] [place], [place] [phone]  [facility]      [address]      [place], [place] [address]     [phone]             [facility] [address] [place], [place] [phone] 90 day commitment/SA/Application process  [facility](men only)     [address]     [place], [place]     [phone]       Check-in at [facility] [address] [place], [place] [address] [phone] Men/Women/Women and Children must be there by 7 pm  [facility] [place], [place] [phone]

## 2020-02-02 ENCOUNTER — Encounter (HOSPITAL_COMMUNITY): Payer: Self-pay | Admitting: Emergency Medicine

## 2020-02-02 ENCOUNTER — Other Ambulatory Visit: Payer: Self-pay

## 2020-02-02 ENCOUNTER — Emergency Department (HOSPITAL_COMMUNITY)
Admission: EM | Admit: 2020-02-02 | Discharge: 2020-02-02 | Disposition: A | Discharge status: Home / Self Care | Payer: Medicaid Other | Attending: Emergency Medicine | Admitting: Emergency Medicine

## 2020-02-02 DIAGNOSIS — F10129 Alcohol abuse with intoxication, unspecified: Secondary | ICD-10-CM | POA: Insufficient documentation

## 2020-02-02 DIAGNOSIS — F17210 Nicotine dependence, cigarettes, uncomplicated: Secondary | ICD-10-CM | POA: Insufficient documentation

## 2020-02-02 DIAGNOSIS — F1490 Cocaine use, unspecified, uncomplicated: Secondary | ICD-10-CM | POA: Insufficient documentation

## 2020-02-02 DIAGNOSIS — F17290 Nicotine dependence, other tobacco product, uncomplicated: Secondary | ICD-10-CM | POA: Insufficient documentation

## 2020-02-02 DIAGNOSIS — F10920 Alcohol use, unspecified with intoxication, uncomplicated: Secondary | ICD-10-CM

## 2020-02-02 MED ORDER — ALUM & MAG HYDROXIDE-SIMETH 200-200-20 MG/5ML PO SUSP
30.0000 mL | Freq: Once | ORAL | Status: AC
  Administered 2020-02-02: 30 mL via ORAL
  Filled 2020-02-02: qty 30

## 2020-02-02 MED ORDER — LIDOCAINE VISCOUS HCL 2 % MT SOLN
15.0000 mL | Freq: Once | OROMUCOSAL | Status: AC
  Administered 2020-02-02: 15 mL via ORAL
  Filled 2020-02-02: qty 15

## 2020-02-02 NOTE — ED Notes (Signed)
Patient tolerating PO

## 2020-02-02 NOTE — ED Triage Notes (Signed)
Patient here from home reporting abd pain after "eating too many [employer] doughnuts". Crack use with alcohol.

## 2020-02-02 NOTE — ED Provider Notes (Signed)
[facility]-EMERGENCY DEPT Provider Note   CSN: [id number] Arrival date & time: 02/02/20  0115     History Chief Complaint  Patient presents with  . Abdominal Pain    Bryan Payne is a 24 y.o. male with a history of homelessness who presents the emergency department by EMS with a chief complaint of abdominal pain.  The patient states that he developed sudden onset abdominal pain after eating for [employer] donuts.    He describes the pain as "feeling all twisted up".  He denies nausea or vomiting.  He endorses crack use and alcohol use last night.  He adamantly denies chest pain or shortness of breath.  He is unable to quantify his use. He states that he needed a place to sleep.  Level 5 caveat secondary to altered mental status.   The history is provided by the patient and medical records. No language interpreter was used.       Past Medical History:  Diagnosis Date  . Homeless   . Knee derangement 04/2013   left  . Lateral meniscus tear 04/2013   left knee    Patient Active Problem List   Diagnosis Date Noted  . Adjustment disorder with disturbance of emotion 05/01/2017  . Mushroom poisoning 12/22/2014  . Acute renal failure (HCC) 12/22/2014  . 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 Dehydration 12/22/2014  . Ingestion of unknown nonmedicinal substance 12/21/2014  . Rectal bleeding 12/21/2014  . Generalized abdominal pain 12/21/2014  . Lateral meniscus tear 05/05/2013  . Appendicitis, acute-gangrenous s/p laparoscopic appendectomy  10/13/2012    Past Surgical History:  Procedure Laterality Date  . APPENDECTOMY    . KNEE ARTHROSCOPY WITH LATERAL MENISECTOMY Left 05/05/2013   Procedure: LEFT KNEE ARTHROSCOPY WITH LATERAL MENISCAL REPAIR;  Surgeon: Eulas Post, MD;  Location: [facility];  Service: Orthopedics;  Laterality: Left;  . LAPAROSCOPIC APPENDECTOMY Right 10/13/2012   Procedure:  APPENDECTOMY LAPAROSCOPIC;  Surgeon: Mariella Saa, MD;  Location: [facility];  Service: General;  Laterality: Right;       Family History  Problem Relation Age of Onset  . Kidney failure Father        Renal transplant  . Diabetes Other        Family on father's side    Social History   Tobacco Use  . Smoking status: Current Every Day Smoker    Packs/day: 0.50    Types: Cigars, Cigarettes  . Smokeless tobacco: Never Used  Vaping Use  . Vaping Use: Never used  Substance Use Topics  . Alcohol use: Yes    Comment: "often"  . Drug use: Yes    Types: Marijuana    Comment: daily    Home Medications Prior to Admission medications   Medication Sig Start Date End Date Taking? Authorizing Provider  selenium sulfide (SELSUN) 1 % LOTN Apply 1 application topically daily. Apply to affected skin morning and evening. Patient not taking: Reported on 01/01/2019 09/04/17   Roxy Horseman, PA-C    Allergies    Vicks vaporub [camph-eucalypt-men-turp-pet]  Review of Systems   Review of Systems  Unable to perform ROS: Mental status change  Gastrointestinal: Positive for abdominal pain.    Physical Exam Updated Vital Signs BP (!) 103/56 (BP Location: Right Arm)   Pulse 64   Temp 97.8 F (36.6 C) (Oral)   Resp 19   SpO2 98%   Physical Exam Vitals and nursing note reviewed.  Constitutional:  General: He is not in acute distress.    Appearance: He is well-developed. He is not ill-appearing, toxic-appearing or diaphoretic.     Comments: Sleeping on arrival, but arouses to loud voice. Appears intoxicated  HENT:     Head: Normocephalic.  Eyes:     Conjunctiva/sclera: Conjunctivae normal.     Comments: Eyes are glassy  Cardiovascular:     Rate and Rhythm: Normal rate and regular rhythm.     Pulses: Normal pulses.     Heart sounds: Normal heart sounds. No murmur heard. No friction rub. No gallop.   Pulmonary:     Effort: Pulmonary effort is normal. No respiratory  distress.     Breath sounds: No stridor. No wheezing, rhonchi or rales.  Chest:     Chest wall: No tenderness.  Abdominal:     General: There is no distension.     Palpations: Abdomen is soft. There is no mass.     Tenderness: There is no abdominal tenderness. There is no right CVA tenderness, left CVA tenderness, guarding or rebound.     Hernia: No hernia is present.     Comments: Abdomen is soft, nontender, nondistended.  Normoactive bowel sounds.  No peritoneal signs.  Musculoskeletal:     Cervical back: Neck supple.     Right lower leg: No edema.     Left lower leg: No edema.  Skin:    General: Skin is warm and dry.  Neurological:     Mental Status: He is alert.     Comments: Speech is slurred.  Gait is ataxic  Psychiatric:        Behavior: Behavior normal.     ED Results / Procedures / Treatments   Labs (all labs ordered are listed, but only abnormal results are displayed) Labs Reviewed - No data to display  EKG None  Radiology No results found.  Procedures Procedures (including critical care time)  Medications Ordered in ED Medications  alum & mag hydroxide-simeth (MAALOX/MYLANTA) 200-200-20 MG/5ML suspension 30 mL (30 mLs Oral Given 02/02/20 0342)    And  lidocaine (XYLOCAINE) 2 % viscous mouth solution 15 mL (15 mLs Oral Given 02/02/20 0342)    ED Course  I have reviewed the triage vital signs and the nursing notes.  Pertinent labs & imaging results that were available during my care of the patient were reviewed by me and considered in my medical decision making (see chart for details).    MDM Rules/Calculators/A&P                           23 year old male with a history of homelessness who presents the emergency department by EMS with a chief complaint of abdominal pain after eating for [employer] doughnuts.  Notably, patient mentions that he also used crack and drink alcohol.  Vital signs are stable.  On exam, he appears visibly intoxicated with  slurred speech.  Attempted to ambulate the patient, but gait was ataxic.  His abdomen is benign.  Patient was able to tolerate fluids by mouth as he was previously given a GI cocktail.  No evidence of withdrawal.  Patient will need to continue to metabolize and will need to be reassessed when he is clinically sober.  No labs or imaging are indicated at this time.  Patient care transferred to PA Gekas at the end of my shift. Patient presentation, ED course, and plan of care discussed with review of all pertinent labs  and imaging. Please see his/her note for further details regarding further ED course and disposition.   Final Clinical Impression(s) / ED Diagnoses Final diagnoses:  Acute alcoholic intoxication without complication (HCC)  Cocaine use    Rx / DC Orders ED Discharge Orders    None       Barkley Boards, PA-C 02/02/20 0803    Molpus, Jonny Ruiz, MD 02/02/20 2225

## 2020-02-02 NOTE — ED Notes (Signed)
Meal tray provided to patient.

## 2020-02-02 NOTE — ED Notes (Signed)
Patient tolerating food and drink at this time but still unsteady when attempting to stand

## 2020-02-09 ENCOUNTER — Other Ambulatory Visit: Payer: Self-pay

## 2020-02-09 ENCOUNTER — Encounter (HOSPITAL_COMMUNITY): Payer: Self-pay | Admitting: Emergency Medicine

## 2020-02-09 DIAGNOSIS — R109 Unspecified abdominal pain: Secondary | ICD-10-CM | POA: Insufficient documentation

## 2020-02-09 DIAGNOSIS — Z5321 Procedure and treatment not carried out due to patient leaving prior to being seen by health care provider: Secondary | ICD-10-CM | POA: Insufficient documentation

## 2020-02-09 DIAGNOSIS — T6291XA Toxic effect of unspecified noxious substance eaten as food, accidental (unintentional), initial encounter: Secondary | ICD-10-CM | POA: Insufficient documentation

## 2020-02-09 NOTE — ED Triage Notes (Signed)
Pt reports that he ate some doughnuts from the trash can and now thinks he has food poisoning.

## 2020-02-10 ENCOUNTER — Emergency Department (HOSPITAL_COMMUNITY)
Admission: EM | Admit: 2020-02-10 | Discharge: 2020-02-10 | Disposition: A | Discharge status: Home / Self Care | Payer: Medicaid Other | Attending: Emergency Medicine | Admitting: Emergency Medicine

## 2020-02-10 NOTE — ED Notes (Signed)
Pt called for updated vitals @0301 . No answer

## 2020-02-12 ENCOUNTER — Other Ambulatory Visit: Payer: Self-pay

## 2020-02-12 ENCOUNTER — Emergency Department (HOSPITAL_COMMUNITY)
Admission: EM | Admit: 2020-02-12 | Discharge: 2020-02-12 | Discharge status: Absent without leave | Payer: Medicaid Other

## 2020-02-12 ENCOUNTER — Encounter (HOSPITAL_COMMUNITY): Payer: Self-pay

## 2020-02-12 ENCOUNTER — Emergency Department (HOSPITAL_COMMUNITY)
Admission: EM | Admit: 2020-02-12 | Discharge: 2020-02-12 | Disposition: A | Discharge status: Home / Self Care | Payer: Medicaid Other | Attending: Emergency Medicine | Admitting: Emergency Medicine

## 2020-02-12 DIAGNOSIS — F17210 Nicotine dependence, cigarettes, uncomplicated: Secondary | ICD-10-CM | POA: Insufficient documentation

## 2020-02-12 DIAGNOSIS — X31XXXA Exposure to excessive natural cold, initial encounter: Secondary | ICD-10-CM | POA: Insufficient documentation

## 2020-02-12 DIAGNOSIS — F17290 Nicotine dependence, other tobacco product, uncomplicated: Secondary | ICD-10-CM | POA: Insufficient documentation

## 2020-02-12 DIAGNOSIS — Z5900 Homelessness unspecified: Secondary | ICD-10-CM | POA: Insufficient documentation

## 2020-02-12 DIAGNOSIS — R1013 Epigastric pain: Secondary | ICD-10-CM | POA: Insufficient documentation

## 2020-02-12 MED ORDER — ALUM & MAG HYDROXIDE-SIMETH 200-200-20 MG/5ML PO SUSP
30.0000 mL | Freq: Once | ORAL | Status: AC
  Administered 2020-02-12: 30 mL via ORAL
  Filled 2020-02-12: qty 30

## 2020-02-12 NOTE — ED Provider Notes (Signed)
[facility]-EMERGENCY DEPT Provider Note   CSN: [id number] Arrival date & time: 02/12/20  1601     History Chief Complaint  Patient presents with  . Wet exposure    Bryan Payne is a 25 y.o. male.  25 year old male with no significant past medical history. Reports he has been eating out of the garbage and is homeless. Patient has been out in the rain all night and is cold. Denies chest pain, nausea, vomiting, diarrhea. Drinks 1 tall beer daily, smokes cocaine, mariajuana, daily smoker.         Past Medical History:  Diagnosis Date  . Homeless   . Knee derangement 04/2013   left  . Lateral meniscus tear 04/2013   left knee    Patient Active Problem List   Diagnosis Date Noted  . Adjustment disorder with disturbance of emotion 05/01/2017  . Mushroom poisoning 12/22/2014  . Acute renal failure (HCC) 12/22/2014  . 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 Dehydration 12/22/2014  . Ingestion of unknown nonmedicinal substance 12/21/2014  . Rectal bleeding 12/21/2014  . Generalized abdominal pain 12/21/2014  . Lateral meniscus tear 05/05/2013  . Appendicitis, acute-gangrenous s/p laparoscopic appendectomy  10/13/2012    Past Surgical History:  Procedure Laterality Date  . APPENDECTOMY    . KNEE ARTHROSCOPY WITH LATERAL MENISECTOMY Left 05/05/2013   Procedure: LEFT KNEE ARTHROSCOPY WITH LATERAL MENISCAL REPAIR;  Surgeon: Eulas Post, MD;  Location: [facility];  Service: Orthopedics;  Laterality: Left;  . LAPAROSCOPIC APPENDECTOMY Right 10/13/2012   Procedure: APPENDECTOMY LAPAROSCOPIC;  Surgeon: Mariella Saa, MD;  Location: [facility];  Service: General;  Laterality: Right;       Family History  Problem Relation Age of Onset  . Kidney failure Father        Renal transplant  . Diabetes Other        Family on father's side    Social History   Tobacco Use  . Smoking status: Current Every Day  Smoker    Packs/day: 0.50    Types: Cigars, Cigarettes  . Smokeless tobacco: Never Used  Vaping Use  . Vaping Use: Never used  Substance Use Topics  . Alcohol use: Yes    Comment: "often"  . Drug use: Yes    Types: Marijuana    Comment: daily    Home Medications Prior to Admission medications   Medication Sig Start Date End Date Taking? Authorizing Provider  selenium sulfide (SELSUN) 1 % LOTN Apply 1 application topically daily. Apply to affected skin morning and evening. Patient not taking: No sig reported 09/04/17   Roxy Horseman, PA-C    Allergies    Vicks vaporub [camph-eucalypt-men-turp-pet]  Review of Systems   Review of Systems  Constitutional: Negative for fever.  Respiratory: Negative for shortness of breath.   Cardiovascular: Negative for chest pain.  Gastrointestinal: Negative for abdominal pain, constipation, diarrhea, nausea and vomiting.  Musculoskeletal: Negative for arthralgias and joint swelling.  Skin: Negative for rash.  Allergic/Immunologic: Negative for immunocompromised state.  Neurological: Negative for weakness.  Psychiatric/Behavioral: Negative for confusion.    Physical Exam Updated Vital Signs BP (!) 120/58 (BP Location: Left Arm)   Pulse 60   Temp 97.7 F (36.5 C) (Oral)   Resp 18   Ht 5\' 7"  (1.702 m)   Wt 59 kg   SpO2 99%   BMI 20.36 kg/m   Physical Exam Vitals and nursing note reviewed.  Constitutional:  General: He is not in acute distress.    Appearance: He is well-developed and well-nourished. He is not diaphoretic.  HENT:     Head: Normocephalic and atraumatic.     Mouth/Throat:     Mouth: Mucous membranes are moist.  Eyes:     Conjunctiva/sclera: Conjunctivae normal.  Cardiovascular:     Rate and Rhythm: Normal rate and regular rhythm.     Pulses: Normal pulses.     Heart sounds: Normal heart sounds.  Pulmonary:     Effort: Pulmonary effort is normal.     Breath sounds: Normal breath sounds.  Abdominal:      Palpations: Abdomen is soft.     Tenderness: There is no abdominal tenderness.  Skin:    General: Skin is warm and dry.     Findings: No erythema or rash.  Neurological:     Mental Status: He is alert and oriented to person, place, and time.  Psychiatric:        Mood and Affect: Mood and affect normal.        Behavior: Behavior normal.     ED Results / Procedures / Treatments   Labs (all labs ordered are listed, but only abnormal results are displayed) Labs Reviewed - No data to display  EKG None  Radiology No results found.  Procedures Procedures (including critical care time)  Medications Ordered in ED Medications  alum & mag hydroxide-simeth (MAALOX/MYLANTA) 200-200-20 MG/5ML suspension 30 mL (30 mLs Oral Given 02/12/20 7564)    ED Course  I have reviewed the triage vital signs and the nursing notes.  Pertinent labs & imaging results that were available during my care of the patient were reviewed by me and considered in my medical decision making (see chart for details).  Clinical Course as of 02/12/20 0749  Sheral Flow Feb 12, 2020  3437 25 year old male brought in by EMS with report of feeling cold.  Sleeping outside in the bed when you last night, patient is homeless.  On exam, patient complains of feeling queasy, abdomen is soft and nontender.  Patient was given GI cocktail, no vomiting.  Vitals within normal meds including normal oral temperature.  Plan is for discharge. [LM]    Clinical Course User Index [LM] Alden Hipp   MDM Rules/Calculators/A&P                          Final Clinical Impression(s) / ED Diagnoses Final diagnoses:  Epigastric pain    Rx / DC Orders ED Discharge Orders    None       Jeannie Fend, PA-C 02/12/20 0749    Molpus, Jonny Ruiz, MD 02/17/20 [phone]

## 2020-02-12 NOTE — ED Triage Notes (Signed)
Patient BIB [facility] from [employer] and was outside the whole night. Patient saying he is cold and denies being in pain. Patient is homeless.   EMS vitals 110/70 HR 88 98% O2 CBG 120

## 2020-02-12 NOTE — ED Notes (Signed)
Pt given blue scrubs, cranberry juice, cheese and crackers. Ambulated to waiting room w/o assistance.

## 2020-02-21 ENCOUNTER — Emergency Department (HOSPITAL_COMMUNITY)
Admission: EM | Admit: 2020-02-21 | Discharge: 2020-02-21 | Disposition: A | Discharge status: Home / Self Care | Payer: Medicaid Other | Attending: Emergency Medicine | Admitting: Emergency Medicine

## 2020-02-21 ENCOUNTER — Encounter (HOSPITAL_COMMUNITY): Payer: Self-pay

## 2020-02-21 ENCOUNTER — Other Ambulatory Visit: Payer: Self-pay

## 2020-02-21 DIAGNOSIS — M79605 Pain in left leg: Secondary | ICD-10-CM | POA: Insufficient documentation

## 2020-02-21 DIAGNOSIS — F17210 Nicotine dependence, cigarettes, uncomplicated: Secondary | ICD-10-CM | POA: Insufficient documentation

## 2020-02-21 DIAGNOSIS — F17290 Nicotine dependence, other tobacco product, uncomplicated: Secondary | ICD-10-CM | POA: Insufficient documentation

## 2020-02-21 NOTE — ED Triage Notes (Signed)
Patient arrived via [facility] with complaints of left leg pain from unknown cause. Patient ambulatory on arrival. ETOH on board.
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
## 2020-02-21 NOTE — Discharge Instructions (Addendum)
Please read the attachments provided on local resources available.  I hope you get the outpatient help that you need.  Return to the ED or seek immediate medical attention should you experience any new or worsening symptoms.

## 2020-02-21 NOTE — ED Notes (Signed)
Patient given food and drink.

## 2020-02-27 ENCOUNTER — Other Ambulatory Visit: Payer: Self-pay

## 2020-02-27 ENCOUNTER — Emergency Department (HOSPITAL_COMMUNITY): Payer: Self-pay

## 2020-02-27 ENCOUNTER — Encounter (HOSPITAL_COMMUNITY): Payer: Self-pay

## 2020-02-27 DIAGNOSIS — R0602 Shortness of breath: Secondary | ICD-10-CM | POA: Insufficient documentation

## 2020-02-27 DIAGNOSIS — Z5321 Procedure and treatment not carried out due to patient leaving prior to being seen by health care provider: Secondary | ICD-10-CM | POA: Insufficient documentation

## 2020-02-27 LAB — BASIC METABOLIC PANEL
Anion gap: 9 (ref 5–15)
BUN: 24 mg/dL — ABNORMAL HIGH (ref 6–20)
CO2: 27 mmol/L (ref 22–32)
Calcium: 9.1 mg/dL (ref 8.9–10.3)
Chloride: 104 mmol/L (ref 98–111)
Creatinine, Ser: 1.06 mg/dL (ref 0.61–1.24)
GFR, Estimated: >60 mL/min (ref >60)
Glucose, Bld: 97 mg/dL (ref 70–99)
Potassium: 3.8 mmol/L (ref 3.5–5.1)
Sodium: 140 mmol/L (ref 135–145)

## 2020-02-27 LAB — CBC
HCT: 42.2 % (ref 39.0–52.0)
Hemoglobin: 14 g/dL (ref 13.0–17.0)
MCH: 31.6 pg (ref 26.0–34.0)
MCHC: 33.2 g/dL (ref 30.0–36.0)
MCV: 95.3 fL (ref 80.0–100.0)
Platelets: 263 10*3/uL (ref 150–400)
RBC: 4.43 MIL/uL (ref 4.22–5.81)
RDW: 12.6 % (ref 11.5–15.5)
WBC: 10.2 10*3/uL (ref 4.0–10.5)
nRBC: 0 % (ref 0.0–0.2)

## 2020-02-27 NOTE — ED Triage Notes (Signed)
Patient arrived via [facility] with complaints of shortness of breath that worsens when he walks.

## 2020-02-28 ENCOUNTER — Emergency Department (HOSPITAL_COMMUNITY)
Admission: EM | Admit: 2020-02-28 | Discharge: 2020-02-28 | Disposition: A | Discharge status: Home / Self Care | Payer: Self-pay | Attending: Emergency Medicine | Admitting: Emergency Medicine

## 2020-03-09 ENCOUNTER — Encounter (HOSPITAL_COMMUNITY): Payer: Self-pay | Admitting: Student

## 2020-03-09 ENCOUNTER — Emergency Department (HOSPITAL_COMMUNITY)
Admission: EM | Admit: 2020-03-09 | Discharge: 2020-03-09 | Disposition: A | Discharge status: Home / Self Care | Payer: Medicaid Other | Attending: Emergency Medicine | Admitting: Emergency Medicine

## 2020-03-09 ENCOUNTER — Other Ambulatory Visit: Payer: Self-pay

## 2020-03-09 DIAGNOSIS — M79672 Pain in left foot: Secondary | ICD-10-CM

## 2020-03-09 DIAGNOSIS — T699XXA Effect of reduced temperature, unspecified, initial encounter: Secondary | ICD-10-CM

## 2020-03-09 DIAGNOSIS — Z5900 Homelessness unspecified: Secondary | ICD-10-CM | POA: Insufficient documentation

## 2020-03-09 DIAGNOSIS — F17210 Nicotine dependence, cigarettes, uncomplicated: Secondary | ICD-10-CM | POA: Insufficient documentation

## 2020-03-09 DIAGNOSIS — M79671 Pain in right foot: Secondary | ICD-10-CM | POA: Insufficient documentation

## 2020-03-09 DIAGNOSIS — F17290 Nicotine dependence, other tobacco product, uncomplicated: Secondary | ICD-10-CM | POA: Insufficient documentation

## 2020-03-09 DIAGNOSIS — X31XXXA Exposure to excessive natural cold, initial encounter: Secondary | ICD-10-CM | POA: Insufficient documentation

## 2020-03-09 NOTE — ED Notes (Signed)
Patient's feet placed in basin with warm water and betadine.

## 2020-03-09 NOTE — ED Notes (Signed)
Pt provided with sandwich and ginger ale

## 2020-03-09 NOTE — ED Provider Notes (Signed)
[facility] EMERGENCY DEPARTMENT Provider Note   CSN: [id number] Arrival date & time: 03/09/20  2458     History Chief Complaint  Patient presents with  . Foot Pain    Bryan Payne is a 25 y.o. male with a hx of tobacco abuse, homelessness, and adjustment disorder who presents to the ED with complaints of bilateral foot pain for the past few weeks that worsened last night with the cold weather. Patient states that he is homeless and does a lot of walking which has lead to foot pain and some blistering. Last night it snowed and the cold further irritated his feet with some tingling prompting ED visit. He denies direct injury/trauma. Denies bleeding, color change, fever, or leg swelling.   HPI     Past Medical History:  Diagnosis Date  . Homeless   . Knee derangement 04/2013   left  . Lateral meniscus tear 04/2013   left knee    Patient Active Problem List   Diagnosis Date Noted  . Adjustment disorder with disturbance of emotion 05/01/2017  . Mushroom poisoning 12/22/2014  . Acute renal failure (HCC) 12/22/2014  . 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 Dehydration 12/22/2014  . Ingestion of unknown nonmedicinal substance 12/21/2014  . Rectal bleeding 12/21/2014  . Generalized abdominal pain 12/21/2014  . Lateral meniscus tear 05/05/2013  . Appendicitis, acute-gangrenous s/p laparoscopic appendectomy  10/13/2012    Past Surgical History:  Procedure Laterality Date  . APPENDECTOMY    . KNEE ARTHROSCOPY WITH LATERAL MENISECTOMY Left 05/05/2013   Procedure: LEFT KNEE ARTHROSCOPY WITH LATERAL MENISCAL REPAIR;  Surgeon: Eulas Post, MD;  Location: [facility];  Service: Orthopedics;  Laterality: Left;  . LAPAROSCOPIC APPENDECTOMY Right 10/13/2012   Procedure: APPENDECTOMY LAPAROSCOPIC;  Surgeon: Mariella Saa, MD;  Location: [facility];  Service: General;  Laterality: Right;       Family History  Problem  Relation Age of Onset  . Kidney failure Father        Renal transplant  . Diabetes Other        Family on father's side    Social History   Tobacco Use  . Smoking status: Current Every Day Smoker    Packs/day: 0.50    Types: Cigars, Cigarettes  . Smokeless tobacco: Never Used  Vaping Use  . Vaping Use: Never used  Substance Use Topics  . Alcohol use: Yes    Comment: "often"  . Drug use: Yes    Types: Marijuana    Comment: daily    Home Medications Prior to Admission medications   Medication Sig Start Date End Date Taking? Authorizing Provider  selenium sulfide (SELSUN) 1 % LOTN Apply 1 application topically daily. Apply to affected skin morning and evening. Patient not taking: No sig reported 09/04/17   Roxy Horseman, PA-C    Allergies    Vicks vaporub [camph-eucalypt-men-turp-pet]  Review of Systems   Review of Systems  Constitutional: Negative for fever.  Respiratory: Negative for shortness of breath.   Cardiovascular: Negative for chest pain and leg swelling.  Gastrointestinal: Negative for abdominal pain.  Musculoskeletal: Positive for arthralgias.  Neurological: Negative for syncope and weakness.       Positive for bilateral foot paresthesias.     Physical Exam Updated Vital Signs BP 128/72 (BP Location: Right Arm)   Pulse 64   Temp 98.7 F (37.1 C) (Oral)   Resp 18   SpO2 98%   Physical Exam Vitals  and nursing note reviewed.  Constitutional:      General: He is not in acute distress.    Appearance: He is not ill-appearing or toxic-appearing.  HENT:     Head: Normocephalic and atraumatic.  Cardiovascular:     Pulses:          Dorsalis pedis pulses are 2+ on the right side and 2+ on the left side.       Posterior tibial pulses are 2+ on the right side and 2+ on the left side.  Pulmonary:     Effort: Pulmonary effort is normal.  Musculoskeletal:     Comments: Lower extremities: Patient's forefeet and his digits are very moist. R 5th dorsal  digit with small prior blistered area that is not particularly erythematous. While there is a lot of moisture there is not significant skin break down. NO increased erythema, fluctuance, induration, edema, or ecchymosis. The feet are cool to the touch however he has 2+ symmetric DP/PT pulses bilaterally, just came from being outside. Patient has intact AROM to bilateral hips, knees, ankles, and all digits. Mildly tender to the plantar aspect of the feet. No focal bony tenderness.   Skin:    General: Skin is warm and dry.     Capillary Refill: Capillary refill takes less than 2 seconds.  Neurological:     Mental Status: He is alert.     Comments: Alert. Clear speech. Sensation grossly intact to bilateral lower extremities. 5/5 strength with plantar/dorsiflexion bilaterally. Patient ambulatory.   Psychiatric:        Mood and Affect: Mood normal.        Behavior: Behavior normal.     ED Results / Procedures / Treatments   Labs (all labs ordered are listed, but only abnormal results are displayed) Labs Reviewed - No data to display  EKG None  Radiology No results found.  Procedures Procedures   Medications Ordered in ED Medications - No data to display  ED Course  I have reviewed the triage vital signs and the nursing notes.  Pertinent labs & imaging results that were available during my care of the patient were reviewed by me and considered in my medical decision making (see chart for details).    MDM Rules/Calculators/A&P                          Patient presents to the ED with bilateral foot pain in the setting of significant amount of ambulation & cold weather. Nontoxic, vitals WNL. On arrival feet are cool & moist to the touch as are his socks that he was wearing. 2+ symmetric pulses. While moist with small area of blistering no significant skin breakdown or areas of infection noted. Feet placed in warm water basin with betadine then thoroughly dried. Patient feeling much  better. Extremities well perfused, reported paresthesias resolved, suspect initial coolness related to the weather and damp socks. Exam not consistent with significant frostbite. Exam does not seem consistent w/ DVT or arterial thrombosis. Patient was given multiple pairs of dry socks, was able to eat a sandwich, and has gotten some rest in the ED. Sleeping for a period of time. Will discharge with resources. I discussed treatment plan, need for follow-up, and return precautions with the patient. Provided opportunity for questions, patient confirmed understanding and is in agreement with plan.   Final Clinical Impression(s) / ED Diagnoses Final diagnoses:  Bilateral foot pain  Cold exposure, initial encounter  Rx / DC Orders ED Discharge Orders    None       Desmond Lope 03/09/20 1045    Terrilee Files, MD 03/09/20 1719

## 2020-03-09 NOTE — ED Triage Notes (Signed)
Pt BIBA for bilat feet blisters for 2 weeks. Pt has been walking a lot lately with wet shoes and he self lanced the blisters today causing them to hurt worse. Bilat foot blisters assessed with no drainage.

## 2020-03-09 NOTE — Discharge Instructions (Signed)
You were seen in the ER today for foot pain.  Please try to keep your feet clean & dry as best as possible with new socks.   Please follow up at the [facility] within 3 days.  Please see attached resource guides for shelters/places to stay.   Return to the Er for new or worsening symptoms including but not limited to worsened pain, numbness, skin break down, redness/drainage from feet, chest pain, trouble breathing, fever, or any other concerns.

## 2020-03-12 ENCOUNTER — Encounter (HOSPITAL_COMMUNITY): Payer: Self-pay | Admitting: Emergency Medicine

## 2020-03-12 ENCOUNTER — Emergency Department (HOSPITAL_COMMUNITY)
Admission: EM | Admit: 2020-03-12 | Discharge: 2020-03-12 | Disposition: A | Discharge status: Home / Self Care | Payer: Medicaid Other | Attending: Emergency Medicine | Admitting: Emergency Medicine

## 2020-03-12 DIAGNOSIS — Z046 Encounter for general psychiatric examination, requested by authority: Secondary | ICD-10-CM | POA: Insufficient documentation

## 2020-03-12 DIAGNOSIS — F17210 Nicotine dependence, cigarettes, uncomplicated: Secondary | ICD-10-CM | POA: Insufficient documentation

## 2020-03-12 DIAGNOSIS — F17290 Nicotine dependence, other tobacco product, uncomplicated: Secondary | ICD-10-CM | POA: Insufficient documentation

## 2020-03-12 DIAGNOSIS — Z5900 Homelessness unspecified: Secondary | ICD-10-CM

## 2020-03-12 NOTE — ED Provider Notes (Signed)
[facility]-EMERGENCY DEPT Provider Note   CSN: [id number] Arrival date & time: 03/12/20  1194     History Chief Complaint  Patient presents with  . Psychiatric Evaluation    Bryan Payne is a 25 y.o. male.  HPI Patient is a 25 year old male with a medical history as noted below.  He presents the emergency department due to homelessness.  He states that he has been having trouble sleeping for the past few weeks.  He is currently homeless.  He states that he broke into a building to get some sleep and the owner called the police who then brought him to the emergency department for evaluation.  Patient has no complaints at this time besides homelessness.  He request food on multiple occasions.  Denies any physical complaints at this time.  Denies any SI/HI or visual/auditory hallucinations.    Past Medical History:  Diagnosis Date  . Homeless   . Knee derangement 04/2013   left  . Lateral meniscus tear 04/2013   left knee    Patient Active Problem List   Diagnosis Date Noted  . Adjustment disorder with disturbance of emotion 05/01/2017  . Mushroom poisoning 12/22/2014  . Acute renal failure (HCC) 12/22/2014  . 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 Dehydration 12/22/2014  . Ingestion of unknown nonmedicinal substance 12/21/2014  . Rectal bleeding 12/21/2014  . Generalized abdominal pain 12/21/2014  . Lateral meniscus tear 05/05/2013  . Appendicitis, acute-gangrenous s/p laparoscopic appendectomy  10/13/2012    Past Surgical History:  Procedure Laterality Date  . APPENDECTOMY    . KNEE ARTHROSCOPY WITH LATERAL MENISECTOMY Left 05/05/2013   Procedure: LEFT KNEE ARTHROSCOPY WITH LATERAL MENISCAL REPAIR;  Surgeon: Eulas Post, MD;  Location: [facility];  Service: Orthopedics;  Laterality: Left;  . LAPAROSCOPIC APPENDECTOMY Right 10/13/2012   Procedure: APPENDECTOMY LAPAROSCOPIC;  Surgeon: Mariella Saa,  MD;  Location: [facility];  Service: General;  Laterality: Right;       Family History  Problem Relation Age of Onset  . Kidney failure Father        Renal transplant  . Diabetes Other        Family on father's side    Social History   Tobacco Use  . Smoking status: Current Every Day Smoker    Packs/day: 0.50    Types: Cigars, Cigarettes  . Smokeless tobacco: Never Used  Vaping Use  . Vaping Use: Never used  Substance Use Topics  . Alcohol use: Yes    Comment: "often"  . Drug use: Yes    Types: Marijuana    Comment: daily    Home Medications Prior to Admission medications   Not on File    Allergies    Vicks vaporub [camph-eucalypt-men-turp-pet]  Review of Systems   Review of Systems  Respiratory: Negative for shortness of breath.   Cardiovascular: Negative for chest pain.  Psychiatric/Behavioral: Positive for sleep disturbance. Negative for agitation, behavioral problems, confusion, hallucinations, self-injury and suicidal ideas. The patient is not nervous/anxious and is not hyperactive.     Physical Exam Updated Vital Signs BP (!) 121/93 (BP Location: Right Arm)   Pulse 64   Temp 98.3 F (36.8 C) (Oral)   Resp 18   SpO2 100%   Physical Exam Vitals and nursing note reviewed.  Constitutional:      General: He is not in acute distress.    Appearance: Normal appearance. He is not ill-appearing, toxic-appearing or diaphoretic.  HENT:     Head: Normocephalic and atraumatic.     Right Ear: External ear normal.     Left Ear: External ear normal.     Nose: Nose normal.     Mouth/Throat:     Mouth: Mucous membranes are moist.     Pharynx: Oropharynx is clear. No oropharyngeal exudate or posterior oropharyngeal erythema.  Eyes:     Extraocular Movements: Extraocular movements intact.  Cardiovascular:     Rate and Rhythm: Normal rate and regular rhythm.     Pulses: Normal pulses.     Heart sounds: Normal heart sounds. No murmur heard. No friction rub. No  gallop.   Pulmonary:     Effort: Pulmonary effort is normal. No respiratory distress.     Breath sounds: Normal breath sounds. No stridor. No wheezing, rhonchi or rales.  Abdominal:     General: Abdomen is flat.     Tenderness: There is no abdominal tenderness.  Musculoskeletal:        General: Normal range of motion.     Cervical back: Normal range of motion and neck supple. No tenderness.  Skin:    General: Skin is warm and dry.  Neurological:     General: No focal deficit present.     Mental Status: He is alert and oriented to person, place, and time.  Psychiatric:        Mood and Affect: Mood normal.        Behavior: Behavior normal.        Thought Content: Thought content does not include homicidal or suicidal ideation.    ED Results / Procedures / Treatments   Labs (all labs ordered are listed, but only abnormal results are displayed) Labs Reviewed - No data to display  EKG None  Radiology No results found.  Procedures Procedures   Medications Ordered in ED Medications - No data to display  ED Course  I have reviewed the triage vital signs and the nursing notes.  Pertinent labs & imaging results that were available during my care of the patient were reviewed by me and considered in my medical decision making (see chart for details).    MDM Rules/Calculators/A&P                          Patient is a 25 year old homeless male.  Presents the emergency department today due to what appears to be homelessness.  Patient states he has not been sleeping well for the past few weeks.  No SI/HI or visual/auditory hallucinations at this time.  He has no physical complaints.  Patient requests something to eat and to remain in the emergency department today and get some sleep.  He was given multiple sandwiches and something to drink.  He was allowed to sleep for a brief period of time in the emergency department.  He was then discharged with a list of shelters in the area.   Discussed return precautions.  His questions were answered and he was amicable at the time of discharge.  Final Clinical Impression(s) / ED Diagnoses Final diagnoses:  Homelessness   Rx / DC Orders ED Discharge Orders    None       Placido Sou, PA-C 03/12/20 1153    Arby Barrette, MD 03/14/20 [phone]

## 2020-03-12 NOTE — ED Triage Notes (Signed)
States police dropped him off-states he was at [employer] and they told him he couldn't be there-states he was at a store and they told him to leave-asking for food-history of malingering

## 2020-03-12 NOTE — Discharge Instructions (Addendum)
I'VE ATTACHED INFORMATION FOR LOCAL SHELTERS. PLEASE LOOK THROUGH THESE AND FIND A LOCAL SHELTER WHERE YOU CAN STAY.

## 2020-03-19 ENCOUNTER — Encounter (HOSPITAL_COMMUNITY): Payer: Self-pay | Admitting: Emergency Medicine

## 2020-03-19 ENCOUNTER — Emergency Department (HOSPITAL_COMMUNITY)
Admission: EM | Admit: 2020-03-19 | Discharge: 2020-03-19 | Disposition: A | Discharge status: Home / Self Care | Payer: Medicaid Other | Attending: Emergency Medicine | Admitting: Emergency Medicine

## 2020-03-19 DIAGNOSIS — F1610 Hallucinogen abuse, uncomplicated: Secondary | ICD-10-CM | POA: Insufficient documentation

## 2020-03-19 DIAGNOSIS — F1410 Cocaine abuse, uncomplicated: Secondary | ICD-10-CM | POA: Insufficient documentation

## 2020-03-19 DIAGNOSIS — F1910 Other psychoactive substance abuse, uncomplicated: Secondary | ICD-10-CM

## 2020-03-19 DIAGNOSIS — Z5900 Homelessness unspecified: Secondary | ICD-10-CM | POA: Insufficient documentation

## 2020-03-19 DIAGNOSIS — F17210 Nicotine dependence, cigarettes, uncomplicated: Secondary | ICD-10-CM | POA: Insufficient documentation

## 2020-03-19 NOTE — ED Notes (Signed)
PA remains at bedside.

## 2020-03-19 NOTE — ED Triage Notes (Signed)
Pt arrives to ED via [facility] due to ingesting 3 LSD tablets after getting out of jail last night. Pt states he called ems to "finish his trip" because he did not have anywhere to go, ems picked him up from [employer].

## 2020-03-19 NOTE — ED Provider Notes (Signed)
[name] EMERGENCY DEPARTMENT Provider Note   CSN: [id number] Arrival date & time: 03/19/20  2951     History Chief Complaint  Patient presents with  . Drug / Alcohol Assessment   Bryan Payne is a 25 y.o. male via EMS, high on LSD and crack cocaine, needing a safe place to "complete my trip".  At the time of my initial exam patient is sleeping in his chair, he was minimally arousable to voice, will mumble in response to questions, needed to person, place, time.  He is not complaining of any pain.  Per EMS report, patient was released from jail last night and "celebrated" by ingesting 3 LSD tablets. He was picked up from a [employer] and brought to the ED   I personally reviewed this patient's medical records.  He has history of polysubstance abuse, homelessness.  LEVEL 5 CAVEAT due to patient's AMS secondary to his intoxication.   HPI     Past Medical History:  Diagnosis Date  . Homeless   . Knee derangement 04/2013   left  . Lateral meniscus tear 04/2013   left knee    Patient Active Problem List   Diagnosis Date Noted  . Adjustment disorder with disturbance of emotion 05/01/2017  . Mushroom poisoning 12/22/2014  . Acute renal failure (HCC) 12/22/2014  . 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 Dehydration 12/22/2014  . Ingestion of unknown nonmedicinal substance 12/21/2014  . Rectal bleeding 12/21/2014  . Generalized abdominal pain 12/21/2014  . Lateral meniscus tear 05/05/2013  . Appendicitis, acute-gangrenous s/p laparoscopic appendectomy  10/13/2012    Past Surgical History:  Procedure Laterality Date  . APPENDECTOMY    . KNEE ARTHROSCOPY WITH LATERAL MENISECTOMY Left 05/05/2013   Procedure: LEFT KNEE ARTHROSCOPY WITH LATERAL MENISCAL REPAIR;  Surgeon: Eulas Post, MD;  [facility];  Service: Orthopedics;  Laterality: Left;  . LAPAROSCOPIC APPENDECTOMY Right 10/13/2012   Procedure:  APPENDECTOMY LAPAROSCOPIC;  Surgeon: Mariella Saa, MD;  Location: [facility];  Service: General;  Laterality: Right;       Family History  Problem Relation Age of Onset  . Kidney failure Father        Renal transplant  . Diabetes Other        Family on father's side    Social History   Tobacco Use  . Smoking status: Current Every Day Smoker    Packs/day: 0.50    Types: Cigars, Cigarettes  . Smokeless tobacco: Never Used  Vaping Use  . Vaping Use: Never used  Substance Use Topics  . Alcohol use: Yes    Comment: "often"  . Drug use: Yes    Types: Marijuana    Comment: daily    Home Medications Prior to Admission medications   Not on File    Allergies    Vicks vaporub [camph-eucalypt-men-turp-pet]  Review of Systems   Review of Systems  Unable to perform ROS: Other (Intoxicated)    Physical Exam Updated Vital Signs BP (!) 122/52 (BP Location: Right Arm)   Pulse 69   Temp 98.7 F (37.1 C) (Oral)   Resp 18   SpO2 100%   Physical Exam Vitals and nursing note reviewed.  Constitutional:      Appearance: He is normal weight.  HENT:     Head: Normocephalic and atraumatic.     Right Ear: External ear normal.     Left Ear: External ear normal.     Nose:  Nose normal.     Mouth/Throat:     Mouth: Mucous membranes are dry.     Pharynx: Oropharynx is clear. Uvula midline. No oropharyngeal exudate, posterior oropharyngeal erythema or uvula swelling.     Tonsils: No tonsillar exudate.  Eyes:     General: Lids are normal. Vision grossly intact.        Right eye: No discharge.        Left eye: No discharge.     Extraocular Movements: Extraocular movements intact.     Conjunctiva/sclera: Conjunctivae normal.     Pupils: Pupils are equal, round, and reactive to light.  Neck:     Trachea: Trachea and phonation normal.  Cardiovascular:     Rate and Rhythm: Normal rate and regular rhythm.     Pulses: Normal pulses.     Heart sounds: Normal heart sounds. No  murmur heard.   Pulmonary:     Effort: Pulmonary effort is normal. No tachypnea, bradypnea, accessory muscle usage, prolonged expiration, respiratory distress or retractions.     Breath sounds: Normal breath sounds. No wheezing or rales.  Chest:     Chest wall: No lacerations, deformity, swelling, tenderness, crepitus or edema.  Abdominal:     General: Bowel sounds are normal. There is no distension.     Palpations: Abdomen is soft.     Tenderness: There is no abdominal tenderness. There is no right CVA tenderness, left CVA tenderness, guarding or rebound.  Musculoskeletal:        General: No deformity.     Cervical back: Full passive range of motion without pain, normal range of motion and neck supple. No rigidity, tenderness or crepitus. No pain with movement, spinous process tenderness or muscular tenderness.     Right lower leg: No edema.     Left lower leg: No edema.  Lymphadenopathy:     Cervical: No cervical adenopathy.  Skin:    General: Skin is warm and dry.     Capillary Refill: Capillary refill takes less than 2 seconds.  Neurological:     Mental Status: He is alert. He is disoriented.     Sensory: Sensation is intact.     Motor: Motor function is intact.     Gait: Gait is intact.     Comments: Initially patient is too intoxicated to converse with this provider.   After sobering up, patient became oriented and was well appearing.    Psychiatric:        Mood and Affect: Mood normal.     ED Results / Procedures / Treatments   Labs (all labs ordered are listed, but only abnormal results are displayed) Labs Reviewed - No data to display  EKG None  Radiology No results found.  Procedures Procedures   Medications Ordered in ED Medications - No data to display  ED Course  I have reviewed the triage vital signs and the nursing notes.  Pertinent labs & imaging results that were available during my care of the patient were reviewed by me and considered in my  medical decision making (see chart for details).  Clinical Course as of 03/19/20 1649  Tue Mar 19, 2020  1415 Patient reevaluated.  At this time he is alert and oriented.  He is very pleasant and eager to talk with this provider.  He states that he got out of jail last night after 72-hour hold, and in order to celebrate she took 3 tablets of LSD and used crack cocaine.  He states  that at that time he presented to [employer] "at the peak of my high" to take a hot shower and sleep on one of the max, however his membership had lapsed and they would not let him in.  Apparently he fell asleep on the bench of the fitness center. When awoken by a staff member, he attempted to contact family to pick him up, without success. He subsequently called EMS. He states that at this time he has no concerns.  He denies any chest pain, shortness of breath palpitation, abdominal pain, nausea, vomiting, headache, blurry vision, double vision, disorientation.  He states he is feeling very well, and is requesting something to eat.  He expresses that he does not desire any way to stop using drugs.   At this time patient is well-appearing, physical exam remains without abnormality.  Patient's intoxication and altered mental status.  You have been from drugs reported, he additionally endorses use of marijuana and opiates. We will provide the patient with something to eat and discharge from the emergency department. [RS]    Clinical Course User Index [RS] Shannelle Alguire, Idelia Salm   MDM Rules/Calculators/A&P                         25 year old male presents emergency department via EMS after ingesting 3 tablets of LSD.  Patient was initially intoxicated at time of my exam, unable to converse with this provider, however he is arousable to voice.  Vital signs were normal on intake.  Physical exam is reassuring.  Cardiopulmonary exam is normal, abdominal exam is benign.  Patient is sleepy, however he is moving all four  extremities spontaneously without difficulty.  Will allow this patient to sober up; will continue to reevaluate.  Patient reevaluated multiple times and continues to be intoxicated, sleeping.  Patient did eventually become sober after few hours of rest, and was reevaluated per ED course above.  Given reassuring physical exam and vital signs no work-up is warranted in the ED at this time.  Will provide patient with food.  Delane voiced understanding with medical evaluation and treatment plan.  Each of his questions was answered to expressed satisfaction.  Patient has information for local shelters, declined resources for substance abuse.  Return precautions given.  Patient is well-appearing, stable, and appropriate for discharge at this time.  This chart was dictated using voice recognition software, Dragon. Despite the best efforts of this provider to proofread and correct errors, errors may still occur which can change documentation meaning.  Final Clinical Impression(s) / ED Diagnoses Final diagnoses:  Polysubstance abuse [facility])  Homelessness    Rx / DC Orders ED Discharge Orders    None       Sherrilee Gilles 03/19/20 1649    Gerhard Munch, MD 03/25/20 1113

## 2020-03-19 NOTE — Discharge Instructions (Signed)
You were evaluated in the ED after your drug intoxication. Your physical exam and vital signs were reassuring. You were allowed to sober up and were given food to eat. You may call the shelters you have contact information for to secure a place to stay.   You may return to the ED as needed.

## 2020-03-19 NOTE — ED Notes (Signed)
Per EDP pt is going to need more time to rest before he can be evaluated.

## 2020-03-19 NOTE — ED Notes (Signed)
Pt given turkey sandwich and drink 

## 2020-03-19 NOTE — ED Notes (Signed)
Patient verbalizes understanding of discharge instructions. Opportunity for questioning and answers were provided. Armband removed by staff, pt discharged from ED.  

## 2020-03-27 ENCOUNTER — Emergency Department (HOSPITAL_COMMUNITY)
Admission: EM | Admit: 2020-03-27 | Discharge: 2020-03-27 | Disposition: A | Discharge status: Home / Self Care | Payer: Medicaid Other | Attending: Emergency Medicine | Admitting: Emergency Medicine

## 2020-03-27 ENCOUNTER — Other Ambulatory Visit: Payer: Self-pay

## 2020-03-27 ENCOUNTER — Encounter (HOSPITAL_COMMUNITY): Payer: Self-pay | Admitting: Emergency Medicine

## 2020-03-27 DIAGNOSIS — Z5900 Homelessness unspecified: Secondary | ICD-10-CM | POA: Insufficient documentation

## 2020-03-27 DIAGNOSIS — F17210 Nicotine dependence, cigarettes, uncomplicated: Secondary | ICD-10-CM | POA: Insufficient documentation

## 2020-03-27 NOTE — ED Triage Notes (Signed)
Arrives via EMS from a gas station. Patient states he has been out in the cold all night and wanted to be evaluated for hypothermia.

## 2020-03-27 NOTE — ED Provider Notes (Signed)
[facility]-EMERGENCY DEPT Provider Note   CSN: [id number] Arrival date & time: 03/27/20  0710     History Chief Complaint  Patient presents with  . Cold Exposure    Bryan Payne is a 25 y.o. male.  HPI     25 year old male comes in with chief complaint of homelessness and feeling cold.  It appears that patient recently has become homeless.  He reports that he was outside all night and is concerned that he might be too cold.  He denies any nausea, vomiting, chest pain, shortness of breath, cough.  He feels a lot better since he has been in the ER.  Past Medical History:  Diagnosis Date  . Homeless   . Knee derangement 04/2013   left  . Lateral meniscus tear 04/2013   left knee    Patient Active Problem List   Diagnosis Date Noted  . Adjustment disorder with disturbance of emotion 05/01/2017  . Mushroom poisoning 12/22/2014  . Acute renal failure (HCC) 12/22/2014  . 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 Dehydration 12/22/2014  . Ingestion of unknown nonmedicinal substance 12/21/2014  . Rectal bleeding 12/21/2014  . Generalized abdominal pain 12/21/2014  . Lateral meniscus tear 05/05/2013  . Appendicitis, acute-gangrenous s/p laparoscopic appendectomy  10/13/2012    Past Surgical History:  Procedure Laterality Date  . APPENDECTOMY    . KNEE ARTHROSCOPY WITH LATERAL MENISECTOMY Left 05/05/2013   Procedure: LEFT KNEE ARTHROSCOPY WITH LATERAL MENISCAL REPAIR;  Surgeon: Eulas Post, MD;  Location: [facility];  Service: Orthopedics;  Laterality: Left;  . LAPAROSCOPIC APPENDECTOMY Right 10/13/2012   Procedure: APPENDECTOMY LAPAROSCOPIC;  Surgeon: Mariella Saa, MD;  [facility];  Service: General;  Laterality: Right;       Family History  Problem Relation Age of Onset  . Kidney failure Father        Renal transplant  . Diabetes Other        Family on father's side    Social History    Tobacco Use  . Smoking status: Current Every Day Smoker    Packs/day: 0.50    Types: Cigars, Cigarettes  . Smokeless tobacco: Never Used  Vaping Use  . Vaping Use: Never used  Substance Use Topics  . Alcohol use: Yes    Comment: "often"  . Drug use: Yes    Types: Marijuana    Comment: daily    Home Medications Prior to Admission medications   Not on File    Allergies    Vicks vaporub [camph-eucalypt-men-turp-pet]  Review of Systems   Review of Systems  Constitutional: Negative for activity change.  Respiratory: Negative for shortness of breath.   Cardiovascular: Negative for chest pain.  Gastrointestinal: Negative for vomiting.    Physical Exam Updated Vital Signs BP 130/70 (BP Location: Right Arm)   Pulse 66   Temp 98.6 F (37 C) (Oral)   Resp 18   Ht 5\' 8"  (1.727 m)   Wt 59 kg   SpO2 99%   BMI 19.78 kg/m   Physical Exam Vitals and nursing note reviewed.  Constitutional:      Appearance: He is well-developed.  HENT:     Head: Atraumatic.  Cardiovascular:     Rate and Rhythm: Normal rate.  Pulmonary:     Effort: Pulmonary effort is normal.  Musculoskeletal:     Cervical back: Neck supple.  Skin:    General: Skin is warm.  Neurological:  Mental Status: He is alert and oriented to person, place, and time.     ED Results / Procedures / Treatments   Labs (all labs ordered are listed, but only abnormal results are displayed) Labs Reviewed - No data to display  EKG None  Radiology No results found.  Procedures Procedures   Medications Ordered in ED Medications - No data to display  ED Course  I have reviewed the triage vital signs and the nursing notes.  Pertinent labs & imaging results that were available during my care of the patient were reviewed by me and considered in my medical decision making (see chart for details).    MDM Rules/Calculators/A&P                         25 year old male comes in a chief complaint of  feeling cold.  He is not hypothermic. He is sleepy, but answers all my questions appropriately.  He has been seen in the ER multiple times recently.  Suspect that the visit was triggered by him being cold outside.  Medically he is cleared for discharge.  Shelter resources provided.  Final Clinical Impression(s) / ED Diagnoses Final diagnoses:  Homelessness    Rx / DC Orders ED Discharge Orders    None       Derwood Kaplan, MD 03/27/20 1658

## 2020-03-27 NOTE — Discharge Instructions (Signed)
For job training purposes, consider going to:  [address] [place], [place]  [address] Ph. [phone] M, T, TH  8:30 am - 5:00 Wed. 8:30 am - 7:00 pm Fri. 8:30 am - 2:00 pm   Substance Abuse Treatment Programs  Intensive Outpatient Programs [facility]     [address]      [place], [place]                   [phone]       [facility] [address] [place], [place] [phone]  [facility]     (Inpatient and outpatient)     [address]           [phone]    [facility] [phone] (Suboxone and Methadone)  [address]      [place], [place] [address]      [phone]       [address] [place], [place] [phone]  [facility] (Outpatient/Inpatient, Chemical)    (insurance only) [phone]             [facility] (Groups & Residential) [place], [place] [phone]     [facility]     [address]     [place], [place]      [phone]       [facility] (for caregivers and family) [phone] [place], [place] [phone]      Residential Treatment Programs [facility]      [address], [place], [place] [address]  [phone]       [facility] [address] [place], [place] [address] [phone]  [facility]        [phone]       [facility] [phone]  [facility] ([facility]             [address]                                         [place], [place]                                                [phone] or [phone]                               [facility] [address] [place], [address] [phone]  [facility]    [address]      [place], [place]     [phone]       [facility] [address] [place], [place] [phone]  [facility]   [address] [place], [place] [address]     [phone]      Admissions: 8am-3pm M-F  [facility] (RT[facility]) [address] [place], [place] [phone]  [facility]: Residential Program [phone] Days)   [place], [place]      [phone] or [phone]     [facility]: [facility] [place], [place] (Walk in Hours over the weekend or by referral)  [facility]  [address] [place], [place], [place] [address] [phone]  Crisis Mobile: [facility]:  [phone] (for crisis response 24 hours a day) [facility] Hotline:      ([phone] Outpatient Psychiatry and Counseling  [facility]: Mobile Crisis Management 24 hours:  [phone]  [facility] [employer] sliding scale fee and walk in schedule: M-F 8am-12pm/1pm-3pm [address]  [place], [place] [address] [phone]  [facility] [address] [place], [place] [address] [phone]  [facility] ([facility] [employer])- new patient walk-in appointments available Monday - Friday 8am -3pm.          [address] [place], [place] [address] [phone] or crisis line- [phone]  [facility] Services/ Intensive Outpatient Therapy Program [address] [place], [place] [address] [phone]  [facility]                  Crisis Services      [phone] [address]     [place], [place] [address]                 [facility]   [facility] [phone]. [address] [place], [place] [address]   [facility] of Care          [address]  [place], [place] [address]       [phone]  [facility] [address] [place], [place] [address] [phone]  [facility]    [address]    [place],  [place] [address]     [phone]       Andee Poles, MD     [address]     [place] [address]     [phone]       [facility] [address] [place] [address]  [facility]     [address] [place], [place]      [phone]       [facility] Eulogio Ditch, MD [address] [place], [place] [address] [phone]  [facility]     [address]     [place], [place] [address]     [phone]       [facility] [address] [place], [place] [address] [phone] Resources for Temporary Residential Assistance/Crisis Centers  [facility]) M-F 8am-3pm   [address] [place], [place] [address]   [phone] Services include: laundry, barbering, support groups, case management, phone  & computer access, showers, AA/NA mtgs, mental health/substance abuse nurse, job skills class, disability information, VA assistance, spiritual classes, etc.   HOMELESS SHELTERS  [facility]     [facility]   [address] [place]     [phone]              [employer] (women and children)       [address] [place], [place] [address] [phone] [email or web address] for application and process Application Required  [facility] [employer] [facility]   [address]    [place] [address]     [phone]                    [facility] [address] [place], [place] [address] [phone] application appt.) Application Required  [facility] (women only)    [address]     [place], [place] [address]     [phone]      Intake starts 6pm daily Need valid ID, SSC, & Police report [employer] [address] [place], [place] [phone] Application Required  [employer] (men only)     [address]      [place], [place]     [phone]       [facility] (Pregnant women only) [address] [place], [place] [phone]  [facility]      [address]      [place], [place] [address]     [phone]             [facility] [address] [place], [place] [phone] 90 day commitment/SA/Application process  [facility](men only)     [address]     [place], [place]     [phone]       Check-in at [facility] [address] [place], [place] [address] [phone] Men/Women/Women and Children must be there by 7 pm  [facility] [place], [place] [phone]

## 2020-03-31 ENCOUNTER — Emergency Department (HOSPITAL_COMMUNITY)
Admission: EM | Admit: 2020-03-31 | Discharge: 2020-03-31 | Disposition: A | Discharge status: Home / Self Care | Payer: Medicaid Other | Attending: Emergency Medicine | Admitting: Emergency Medicine

## 2020-03-31 ENCOUNTER — Other Ambulatory Visit: Payer: Self-pay

## 2020-03-31 ENCOUNTER — Encounter (HOSPITAL_COMMUNITY): Payer: Self-pay

## 2020-03-31 ENCOUNTER — Encounter (HOSPITAL_COMMUNITY): Payer: Self-pay | Admitting: Emergency Medicine

## 2020-03-31 DIAGNOSIS — F329 Major depressive disorder, single episode, unspecified: Secondary | ICD-10-CM | POA: Insufficient documentation

## 2020-03-31 DIAGNOSIS — R451 Restlessness and agitation: Secondary | ICD-10-CM | POA: Insufficient documentation

## 2020-03-31 DIAGNOSIS — F4325 Adjustment disorder with mixed disturbance of emotions and conduct: Secondary | ICD-10-CM | POA: Insufficient documentation

## 2020-03-31 DIAGNOSIS — F1920 Other psychoactive substance dependence, uncomplicated: Secondary | ICD-10-CM

## 2020-03-31 DIAGNOSIS — F17210 Nicotine dependence, cigarettes, uncomplicated: Secondary | ICD-10-CM | POA: Insufficient documentation

## 2020-03-31 DIAGNOSIS — F1910 Other psychoactive substance abuse, uncomplicated: Secondary | ICD-10-CM

## 2020-03-31 DIAGNOSIS — R456 Violent behavior: Secondary | ICD-10-CM | POA: Insufficient documentation

## 2020-03-31 DIAGNOSIS — R45851 Suicidal ideations: Secondary | ICD-10-CM | POA: Insufficient documentation

## 2020-03-31 NOTE — ED Notes (Signed)
Crackers and drink provided to pt.

## 2020-03-31 NOTE — ED Triage Notes (Addendum)
Patient arrives from the [facility]. Staff called due to patient sleeping in the entrance to the hotel, patient is banned from the hotel. When patient was awakened by police, he stated that he wanted to go to the hospital because he "feels bad." Patient states he took 3 tabs of LSD.

## 2020-03-31 NOTE — ED Provider Notes (Signed)
[facility]-EMER[facility]PT Provider Note   CSN: [id number] Arrival date & time: 03/31/20  7829     History Chief Complaint  Patient presents with  . LSD Use    Feels bad    Bryan Payne is a 25 y.o. male.  25 year old male with a history of homelessness presents to the emergency department from the [facility].  Patient states that he took some LSD that did not agree with him.  He is looking for "a place to land for an hour".  No additional complaints at this time.  Staff of the hotel apparently banned him from their establishment after he was found sleeping in their entrance; police were called.  He is requesting that we procure some money from his pocket to get him chips from the vending machine in the waiting room.        Past Medical History:  Diagnosis Date  . Homeless   . Knee derangement 04/2013   left  . Lateral meniscus tear 04/2013   left knee    Patient Active Problem List   Diagnosis Date Noted  . Adjustment disorder with disturbance of emotion 05/01/2017  . Mushroom poisoning 12/22/2014  . Acute renal failure (HCC) 12/22/2014  . 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 Dehydration 12/22/2014  . Ingestion of unknown nonmedicinal substance 12/21/2014  . Rectal bleeding 12/21/2014  . Generalized abdominal pain 12/21/2014  . Lateral meniscus tear 05/05/2013  . Appendicitis, acute-gangrenous s/p laparoscopic appendectomy  10/13/2012    Past Surgical History:  Procedure Laterality Date  . APPENDECTOMY    . KNEE ARTHROSCOPY WITH LATERAL MENISECTOMY Left 05/05/2013   Procedure: LEFT KNEE ARTHROSCOPY WITH LATERAL MENISCAL REPAIR;  Surgeon: Eulas Post, MD;  Location: [facility];  Service: Orthopedics;  Laterality: Left;  . LAPAROSCOPIC APPENDECTOMY Right 10/13/2012   Procedure: APPENDECTOMY LAPAROSCOPIC;  Surgeon: Mariella Saa, MD;  [facility];  Service: General;  Laterality: Right;        Family History  Problem Relation Age of Onset  . Kidney failure Father        Renal transplant  . Diabetes Other        Family on father's side    Social History   Tobacco Use  . Smoking status: Current Every Day Smoker    Packs/day: 0.50    Types: Cigars, Cigarettes  . Smokeless tobacco: Never Used  Vaping Use  . Vaping Use: Never used  Substance Use Topics  . Alcohol use: Yes    Comment: "often"  . Drug use: Yes    Types: Marijuana    Comment: daily, hallucinagenics    Home Medications Prior to Admission medications   Not on File    Allergies    Vicks vaporub [camph-eucalypt-men-turp-pet]  Review of Systems   Review of Systems  Ten systems reviewed and are negative for acute change, except as noted in the HPI.    Physical Exam Updated Vital Signs BP 127/69 (BP Location: Left Arm)   Pulse (!) 58   Temp 98.3 F (36.8 C) (Oral)   Resp 16   SpO2 100%   Physical Exam Vitals and nursing note reviewed.  Constitutional:      General: He is not in acute distress.    Appearance: He is well-developed and well-nourished. He is not diaphoretic.     Comments: Content on sleeping when not stimulated with conversation; awake easily.  HENT:     Head: Normocephalic and  atraumatic.  Eyes:     General: No scleral icterus.    Extraocular Movements: EOM normal.     Conjunctiva/sclera: Conjunctivae normal.  Pulmonary:     Effort: Pulmonary effort is normal. No respiratory distress.     Comments: Respirations even and unlabored Musculoskeletal:        General: Normal range of motion.     Cervical back: Normal range of motion.  Skin:    General: Skin is warm and dry.     Coloration: Skin is not pale.     Findings: No erythema or rash.  Neurological:     Mental Status: He is alert and oriented to person, place, and time.  Psychiatric:        Mood and Affect: Mood and affect normal.        Behavior: Behavior normal.     ED Results / Procedures /  Treatments   Labs (all labs ordered are listed, but only abnormal results are displayed) Labs Reviewed - No data to display  EKG None  Radiology No results found.  Procedures Procedures   Medications Ordered in ED Medications - No data to display  ED Course  I have reviewed the triage vital signs and the nursing notes.  Pertinent labs & imaging results that were available during my care of the patient were reviewed by me and considered in my medical decision making (see chart for details).    MDM Rules/Calculators/A&P                          25 year old male with history of homelessness and polysubstance abuse.  Is nontoxic-appearing and in no distress.  Is content on sleeping when not stimulated with conversation.  No pain complaints.  Vitals reassuring.  Do not feel further emergent work-up is presently indicated.  Will discharge with local resources.    Final Clinical Impression(s) / ED Diagnoses Final diagnoses:  Polysubstance abuse [facility])    Rx / DC Orders ED Discharge Orders    None       Antony Madura, PA-C 03/31/20 0557    Paula Libra, MD 03/31/20 [phone]

## 2020-03-31 NOTE — BH Assessment (Signed)
Tele Assessment Note   Patient Name: Bryan Payne MRN: [id number] Referring Physician: EDP Location of [facility]: [facility] Location of Provider: Behavioral Health TTS Department  THERESA DOHRMAN is a 25 y.o. male who presented to [facility] on a voluntary basis with complaint of suicidal ideation.  Per hospital notes, Pt has a history of homelessness and presented to the ED from the [facility] (he was sleeping in a doorway there).  Pt stated that he took 3 tabs of LSD, felt bad, and and wanted ''a place to land.''  Pt had a medical assessed and was discharged with a list of shelters. He refused to leave, became verbally aggressive, and had to be escorted out of the department.  Once outside, he returned, claiming to be suicidal.  Per note, he refuses vitals.  Pt was assessed.  He stated that he is homeless, is unemployed, and does not have a Therapist, sports.  When asked, Pt endorsed suicidal ideation without plan or intent.  He also denied hallucination and homicidal ideation.  Pt stated that he recently cut his palm (no stitches or sutures required) so he could bleed.  He also endorsed use of LSD and acid, alcohol, crack cocaine, and marijuana.  UDS was not available at time of assessment.  When asked what help he wanted, Pt replied, ''I am going to keep that to myself.''  During assessment, Pt presented as alert and oriented.  He had normal eye contact.  Demeanor was guarded.  Pt was dressed in street clothes, and he appeared appropriately groomed.  Pt's mood was reported as sad.  Affect was blunted.  Pt's speech was normal in rate, rhythm, and volume.  Thought processes were within normal range, and thought content was logical and goal-oriented.  There was no evidence of delusion.  Pt's memory and concentration were fair.  He could not recall the name of a former provider who gave him medication for mood.  Pt's insight, judgment, and impulse control were fair to poor as evidenced by ongoing substance use.     Consulted with Roselie Skinner, NP, who determined that Pt does not meet inpatient criteria.  Recommend discharge.  Ms. Arvilla Market will order peer support.  Diagnosis: Substance-induced mood disorder; polysubstance use  Past Medical History:  Past Medical History:  Diagnosis Date  . Homeless   . Knee derangement 04/2013   left  . Lateral meniscus tear 04/2013   left knee    Past Surgical History:  Procedure Laterality Date  . APPENDECTOMY    . KNEE ARTHROSCOPY WITH LATERAL MENISECTOMY Left 05/05/2013   Procedure: LEFT KNEE ARTHROSCOPY WITH LATERAL MENISCAL REPAIR;  Surgeon: Eulas Post, MD;  Location: [facility];  Service: Orthopedics;  Laterality: Left;  . LAPAROSCOPIC APPENDECTOMY Right 10/13/2012   Procedure: APPENDECTOMY LAPAROSCOPIC;  Surgeon: Mariella Saa, MD;  [facility];  Service: General;  Laterality: Right;    Family History:  Family History  Problem Relation Age of Onset  . Kidney failure Father        Renal transplant  . Diabetes Other        Family on father's side    Social History:  reports that he has been smoking cigars and cigarettes. He has been smoking about 0.50 packs per day. He has never used smokeless tobacco. He reports current alcohol use. He reports current drug use. Drug: Marijuana.  Additional Social History:  Alcohol / Drug Use Pain Medications: Please see MAR Prescriptions: Please see MAR Over the  Counter: Please see MAR History of alcohol / drug use?: Yes Substance #1 Name of Substance 1: 'Acid'' 1 - Last Use / Amount: Unknown quantity -- 03/31/2020 Substance #2 Name of Substance 2: Crack Substance #3 Name of Substance 3: Alcohol Substance #4 Name of Substance 4: Marijuana 4 - Last Use / Amount: ''I don't use that anymore.  It's too expensive.''  CIWA:   COWS:    Allergies:  Allergies  Allergen Reactions  . Vicks Vaporub [Camph-Eucalypt-Men-Turp-Pet] Other (See Comments)    Caused eye irritation and shortness  of breath    Home Medications: (Not in a hospital admission)   OB/GYN Status:  No LMP for male patient.  General Assessment Data Location of Assessment: [facility] Date Telepsych consult ordered in [facility]: 03/31/20 Marital status: Single                                                 Advance Directives (For Healthcare) Does Patient Have a Medical Advance Directive?: No Would patient like information on creating a medical advance directive?: No - Patient declined          Disposition:     This service was provided via telemedicine using a 2-way, interactive audio and video technology.  Names of all persons participating in this telemedicine service and their role in this encounter. Name: Bryan Payne, Bryan Payne Role: Pt             Dorris Fetch Albin Duckett 03/31/2020 8:40 AM

## 2020-03-31 NOTE — ED Notes (Signed)
PT REFUSING VITAL SIGNS.

## 2020-03-31 NOTE — ED Notes (Signed)
An After Visit Summary was printed and given to the patient. Discharge instructions given and no further questions at this time.  

## 2020-03-31 NOTE — Discharge Instructions (Signed)
The psychiatry team felt you are safe for discharge home.  We contacted the peer team to get involved with your care as an outpatient.  Please follow-up with the [facility] and your outpatient teams.  If any symptoms change or worsen, please return to the nearest emergency room.

## 2020-03-31 NOTE — ED Provider Notes (Signed)
[facility]-EMERGENCY DEPT Provider Note   CSN: [id number] Arrival date & time: 03/31/20  2423     History No chief complaint on file.   Bryan Payne is a 25 y.o. male.  The history is provided by the patient and medical records. No language interpreter was used.  Mental Health Problem Presenting symptoms: agitation, suicidal thoughts and suicidal threats   Degree of incapacity (severity):  Severe Onset quality:  Gradual Duration:  1 day Timing:  Constant Progression:  Unchanged Chronicity:  Recurrent Context: drug abuse   Context: not medication   Treatment compliance:  Untreated Relieved by:  Nothing Worsened by:  Drugs Ineffective treatments:  None tried Associated symptoms: no abdominal pain, no chest pain and no headaches   Risk factors: hx of suicide attempts        Past Medical History:  Diagnosis Date  . Homeless   . Knee derangement 04/2013   left  . Lateral meniscus tear 04/2013   left knee    Patient Active Problem List   Diagnosis Date Noted  . Adjustment disorder with disturbance of emotion 05/01/2017  . Mushroom poisoning 12/22/2014  . Acute renal failure (HCC) 12/22/2014  . 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 Dehydration 12/22/2014  . Ingestion of unknown nonmedicinal substance 12/21/2014  . Rectal bleeding 12/21/2014  . Generalized abdominal pain 12/21/2014  . Lateral meniscus tear 05/05/2013  . Appendicitis, acute-gangrenous s/p laparoscopic appendectomy  10/13/2012    Past Surgical History:  Procedure Laterality Date  . APPENDECTOMY    . KNEE ARTHROSCOPY WITH LATERAL MENISECTOMY Left 05/05/2013   Procedure: LEFT KNEE ARTHROSCOPY WITH LATERAL MENISCAL REPAIR;  Surgeon: Eulas Post, MD;  Location:  S[facility] CENTER;  Service: Orthopedics;  Laterality: Left;  . LAPAROSCOPIC APPENDECTOMY Right 10/13/2012   Procedure: APPENDECTOMY LAPAROSCOPIC;  Surgeon: Mariella Saa, MD;   Location: [facility];  Service: General;  Laterality: Right;       Family History  Problem Relation Age of Onset  . Kidney failure Father        Renal transplant  . Diabetes Other        Family on father's side    Social History   Tobacco Use  . Smoking status: Current Every Day Smoker    Packs/day: 0.50    Types: Cigars, Cigarettes  . Smokeless tobacco: Never Used  Vaping Use  . Vaping Use: Never used  Substance Use Topics  . Alcohol use: Yes    Comment: "often"  . Drug use: Yes    Types: Marijuana    Comment: daily, hallucinagenics    Home Medications Prior to Admission medications   Not on File    Allergies    Vicks vaporub [camph-eucalypt-men-turp-pet]  Review of Systems   Review of Systems  Unable to perform ROS: Psychiatric disorder  Constitutional: Negative for fever.  Eyes: Negative for pain and visual disturbance.  Respiratory: Negative for shortness of breath.   Cardiovascular: Negative for chest pain.  Gastrointestinal: Negative for abdominal pain.  Genitourinary: Negative for flank pain.  Skin: Negative for wound.  Neurological: Negative for seizures, syncope and headaches.  Psychiatric/Behavioral: Positive for agitation and suicidal ideas.  All other systems reviewed and are negative.   Physical Exam Updated Vital Signs BP 122/86   Pulse 78   Resp 18   SpO2 99%   Physical Exam Vitals and nursing note reviewed.  Constitutional:      General: He is not in acute distress.  Appearance: He is well-developed and well-nourished. He is not ill-appearing, toxic-appearing or diaphoretic.  HENT:     Head: Normocephalic and atraumatic.     Nose: No congestion or rhinorrhea.     Mouth/Throat:     Pharynx: No oropharyngeal exudate or posterior oropharyngeal erythema.  Eyes:     Conjunctiva/sclera: Conjunctivae normal.     Pupils: Pupils are equal, round, and reactive to light.  Cardiovascular:     Rate and Rhythm: Normal rate and regular rhythm.      Heart sounds: No murmur heard.   Pulmonary:     Effort: Pulmonary effort is normal. No respiratory distress.     Breath sounds: Normal breath sounds. No wheezing, rhonchi or rales.  Chest:     Chest wall: No tenderness.  Abdominal:     General: Abdomen is flat.     Palpations: Abdomen is soft.     Tenderness: There is no abdominal tenderness. There is no right CVA tenderness, left CVA tenderness, guarding or rebound.  Musculoskeletal:        General: No tenderness or edema.     Cervical back: Neck supple.  Skin:    General: Skin is warm and dry.     Capillary Refill: Capillary refill takes less than 2 seconds.     Findings: No erythema.  Neurological:     General: No focal deficit present.     Mental Status: He is alert. Mental status is at baseline.     Sensory: No sensory deficit.     Motor: No weakness.  Psychiatric:        Attention and Perception: Auditory hallucinations: possible.        Mood and Affect: Mood is depressed. Affect is angry.        Behavior: Behavior is agitated and aggressive.        Thought Content: Thought content includes suicidal ideation. Thought content does not include homicidal ideation. Thought content includes suicidal plan. Thought content does not include homicidal plan.     ED Results / Procedures / Treatments   Labs (all labs ordered are listed, but only abnormal results are displayed) Labs Reviewed - No data to display  EKG None  Radiology No results found.  Procedures Procedures   Medications Ordered in ED Medications - No data to display  ED Course  I have reviewed the triage vital signs and the nursing notes.  Pertinent labs & imaging results that were available during my care of the patient were reviewed by me and considered in my medical decision making (see chart for details).    MDM Rules/Calculators/A&P                          Bryan Payne is a 25 y.o. male with a past medical history significant for  homelessness, prior appendectomy, polysubstance abuse who was recently seen overnight for altered mental status in the setting of crack cocaine use who was just discharged check back in for suicidal ideation.  He reports that he has plans to hurt and kill himself.  Describes that "there are thousand ways to kill yourself and I will pick one of them".  He reports that he has cut his arms with self-harm in the past and he wants to do so again.  He says that he has no homicidal thoughts at this time and agreed that he is experience some hallucinations in the past but will not further describe them.  He says that he has seen a psychiatrist in the past but has not ever been on medications.  He was verbally aggressive with staff while checking back in and I was called to the triage area to see him.  Patient agrees that he did not tell the previous team about his suicidal thoughts as he was "out of it" from the drugs.  He says that he wants to get help before getting discharged.  He denies any other physical concerns at this time.  On exam, lungs clear and chest nontender.  Back nontender.  Patient moving all extremities.  Patient speaking clearly but somewhat aggressively initially.  I was able to verbally de-escalate the patient and he is agreeable to cooperation with the emergency team so that he can talk to psychiatry.  As patient was medically cleared not even an hour ago at discharge, do not feel he needs further diagnostic work-up at this time.  He physically appears well but I agree with him talking to [facility] about his suicidal thoughts.  If psychiatry feels he needs further work-up or inpatient management, would be happy to get admission labs for them.  Patient will get a snack and talk to psychiatry to determine disposition.  10:51 AM Psychiatry reached out saying that patient is psychiatrically with a recommended peers support involvement with the patient's care.  I called for support who will call me  back shortly with plan.  Anticipate discharge.  I got in contact with peer team who will get involved with the patient.  Patient was allowed to rest a while longer.  As patient has been psychiatrically cleared and.  Was contacted to get involved, we feel he is now safe for discharge home.  Patient will be discharged for outpatient follow-up.   Final Clinical Impression(s) / ED Diagnoses Final diagnoses:  Suicidal thoughts    Rx / DC Orders ED Discharge Orders    None     Clinical Impression: 1. Suicidal thoughts     Disposition: Discharge  Condition: Good  I have discussed the results, Dx and Tx plan with the pt(& family if present). He/she/they expressed understanding and agree(s) with the plan. Discharge instructions discussed at great length. Strict return precautions discussed and pt &/or family have verbalized understanding of the instructions. No further questions at time of discharge.    New Prescriptions   No medications on file    Follow Up: [facility] [address] [place] [address] [phone] Schedule an appointment as soon as possible for a visit    [facility] [address] [place] [address] [phone]    [facility] [address] [id number] [address] [address] [phone]       Miaa Latterell, Canary Brim, MD 03/31/20 1440
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
## 2020-03-31 NOTE — ED Notes (Signed)
Attempted to get patient discharged. Patient refusing to get out of bed and get dressed. Discussed multiple times with patient that we are providing him a list of shelters to go to to stay warm. Patient verbalized understanding, but is still refusing to get up and get dressed. Security called to bedside to help escort patient out of the department.

## 2020-04-01 NOTE — Patient Outreach (Signed)
[facility] followed up with Pt in the community by contacting Pt father.  Pt father was upset at the fact [facility] was reaching out to help Pt. [facility] gave Pts father contact information to a few facilities. [facility] are trying to get Pt placed in [facility] in [place].

## 2020-04-05 ENCOUNTER — Emergency Department (HOSPITAL_COMMUNITY): Payer: Medicaid Other

## 2020-04-05 ENCOUNTER — Emergency Department (HOSPITAL_COMMUNITY)
Admission: EM | Admit: 2020-04-05 | Discharge: 2020-04-05 | Disposition: A | Discharge status: Home / Self Care | Payer: Medicaid Other | Attending: Emergency Medicine | Admitting: Emergency Medicine

## 2020-04-05 DIAGNOSIS — M79671 Pain in right foot: Secondary | ICD-10-CM | POA: Insufficient documentation

## 2020-04-05 DIAGNOSIS — S60221A Contusion of right hand, initial encounter: Secondary | ICD-10-CM | POA: Insufficient documentation

## 2020-04-05 DIAGNOSIS — Y9389 Activity, other specified: Secondary | ICD-10-CM | POA: Insufficient documentation

## 2020-04-05 DIAGNOSIS — F17210 Nicotine dependence, cigarettes, uncomplicated: Secondary | ICD-10-CM | POA: Insufficient documentation

## 2020-04-05 DIAGNOSIS — M79672 Pain in left foot: Secondary | ICD-10-CM | POA: Insufficient documentation

## 2020-04-05 MED ORDER — ACETAMINOPHEN 500 MG PO TABS
1000.0000 mg | ORAL_TABLET | Freq: Once | ORAL | Status: AC
  Administered 2020-04-05: 1000 mg via ORAL
  Filled 2020-04-05: qty 2
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
## 2020-04-19 ENCOUNTER — Encounter (HOSPITAL_COMMUNITY): Payer: Self-pay

## 2020-04-19 ENCOUNTER — Other Ambulatory Visit: Payer: Self-pay

## 2020-04-19 ENCOUNTER — Emergency Department (HOSPITAL_COMMUNITY)
Admission: EM | Admit: 2020-04-19 | Discharge: 2020-04-20 | Disposition: A | Discharge status: Home / Self Care | Payer: Medicaid Other | Attending: Emergency Medicine | Admitting: Emergency Medicine

## 2020-04-19 DIAGNOSIS — F1910 Other psychoactive substance abuse, uncomplicated: Secondary | ICD-10-CM

## 2020-04-19 DIAGNOSIS — F1110 Opioid abuse, uncomplicated: Secondary | ICD-10-CM | POA: Insufficient documentation

## 2020-04-19 DIAGNOSIS — F10920 Alcohol use, unspecified with intoxication, uncomplicated: Secondary | ICD-10-CM

## 2020-04-19 DIAGNOSIS — F10129 Alcohol abuse with intoxication, unspecified: Secondary | ICD-10-CM | POA: Insufficient documentation

## 2020-04-19 DIAGNOSIS — F17210 Nicotine dependence, cigarettes, uncomplicated: Secondary | ICD-10-CM | POA: Insufficient documentation

## 2020-04-19 LAB — CBC WITH DIFFERENTIAL/PLATELET
Abs Immature Granulocytes: 0.03 K/uL (ref 0.00–0.07)
Basophils Absolute: 0.1 K/uL (ref 0.0–0.1)
Basophils Relative: 1 %
Eosinophils Absolute: 0.2 K/uL (ref 0.0–0.5)
Eosinophils Relative: 3 %
HCT: 43.1 % (ref 39.0–52.0)
Hemoglobin: 14.3 g/dL (ref 13.0–17.0)
Immature Granulocytes: 0 %
Lymphocytes Relative: 24 %
Lymphs Abs: 2 K/uL (ref 0.7–4.0)
MCH: 31.7 pg (ref 26.0–34.0)
MCHC: 33.2 g/dL (ref 30.0–36.0)
MCV: 95.6 fL (ref 80.0–100.0)
Monocytes Absolute: 0.6 K/uL (ref 0.1–1.0)
Monocytes Relative: 8 %
Neutro Abs: 5.3 K/uL (ref 1.7–7.7)
Neutrophils Relative %: 64 %
Platelets: 269 K/uL (ref 150–400)
RBC: 4.51 MIL/uL (ref 4.22–5.81)
RDW: 12.4 % (ref 11.5–15.5)
WBC: 8.2 K/uL (ref 4.0–10.5)
nRBC: 0 % (ref 0.0–0.2)

## 2020-04-19 LAB — COMPREHENSIVE METABOLIC PANEL
ALT: 31 U/L (ref 0–44)
AST: 35 U/L (ref 15–41)
Albumin: 4 g/dL (ref 3.5–5.0)
Alkaline Phosphatase: 42 U/L (ref 38–126)
Anion gap: 12 (ref 5–15)
BUN: 14 mg/dL (ref 6–20)
CO2: 25 mmol/L (ref 22–32)
Calcium: 9 mg/dL (ref 8.9–10.3)
Chloride: 102 mmol/L (ref 98–111)
Creatinine, Ser: 0.89 mg/dL (ref 0.61–1.24)
GFR, Estimated: >60 mL/min (ref >60)
Glucose, Bld: 81 mg/dL (ref 70–99)
Potassium: 3.9 mmol/L (ref 3.5–5.1)
Sodium: 139 mmol/L (ref 135–145)
Total Bilirubin: 0.9 mg/dL (ref 0.3–1.2)
Total Protein: 7 g/dL (ref 6.5–8.1)

## 2020-04-19 MED ORDER — SODIUM CHLORIDE 0.9 % IV BOLUS
1000.0000 mL | Freq: Once | INTRAVENOUS | Status: AC
  Administered 2020-04-20: 1000 mL via INTRAVENOUS

## 2020-04-19 MED ORDER — THIAMINE HCL 100 MG/ML IJ SOLN
100.0000 mg | Freq: Once | INTRAMUSCULAR | Status: AC
  Administered 2020-04-20: 100 mg via INTRAVENOUS
  Filled 2020-04-19: qty 2

## 2020-04-19 MED ORDER — ONDANSETRON HCL 4 MG/2ML IJ SOLN: 4.0000 mg | Freq: Once | INTRAMUSCULAR | Status: DC

## 2020-04-19 MED ORDER — PROMETHAZINE HCL 25 MG PO TABS: 25.0000 mg | ORAL_TABLET | Freq: Once | ORAL | Status: DC

## 2020-04-19 MED ORDER — ONDANSETRON 4 MG PO TBDP
4.0000 mg | ORAL_TABLET | Freq: Once | ORAL | Status: AC
  Administered 2020-04-19: 4 mg via ORAL
  Filled 2020-04-19: qty 1

## 2020-04-19 NOTE — ED Notes (Signed)
Pt given coffee per request and then began to vomit again.  Provider aware, orders given.

## 2020-04-19 NOTE — Discharge Instructions (Signed)
Please follow up with the resources listed below.   Return to emergency department for any difficulty breathing, vomiting or any other worsening concerning symptoms.

## 2020-04-19 NOTE — ED Notes (Signed)
Pt refusing to have an IV for medication/fluids.  Pt stating he will drink water or soda to hydrate.  Pt more alert, following commands, continues to drift back to sleep.  Provider aware of pt refusing IV.

## 2020-04-19 NOTE — ED Notes (Signed)
Charge nurse at bedside talking to pt.

## 2020-04-19 NOTE — ED Provider Notes (Signed)
[facility]-EMERG[facility] DEPT Provider Note   CSN: [id number] Arrival date & time: 04/19/20  1831     History Chief Complaint  Patient presents with  . Drug Overdose    Bryan Payne is a 25 y.o. male with PMH/o polysubstance abuse, Cannabis abuse, BIB EMS for evaluation of being found unresponsive. Per EMS, they gave narcan with some improvement. Patient does tell me he snorted heroine today. He also reports he drank half a 40. He denies any other drug use.   EM LEVEL 5 CAVEAT DUE TO INTOXICATION  The history is provided by the patient.       Past Medical History:  Diagnosis Date  . Homeless   . Knee derangement 04/2013   left  . Lateral meniscus tear 04/2013   left knee    Patient Active Problem List   Diagnosis Date Noted  . Polysubstance (excluding opioids) dependence, daily use (HCC)   . Adjustment disorder with disturbance of emotion 05/01/2017  . Mushroom poisoning 12/22/2014  . Acute renal failure (HCC) 12/22/2014  . 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 Dehydration 12/22/2014  . Ingestion of unknown nonmedicinal substance 12/21/2014  . Rectal bleeding 12/21/2014  . Generalized abdominal pain 12/21/2014  . Lateral meniscus tear 05/05/2013  . Appendicitis, acute-gangrenous s/p laparoscopic appendectomy  10/13/2012    Past Surgical History:  Procedure Laterality Date  . APPENDECTOMY    . KNEE ARTHROSCOPY WITH LATERAL MENISECTOMY Left 05/05/2013   Procedure: LEFT KNEE ARTHROSCOPY WITH LATERAL MENISCAL REPAIR;  Surgeon: Eulas Post, MD;  Location: [facility];  Service: Orthopedics;  Laterality: Left;  . LAPAROSCOPIC APPENDECTOMY Right 10/13/2012   Procedure: APPENDECTOMY LAPAROSCOPIC;  Surgeon: Mariella Saa, MD;  Location: [facility];  Service: General;  Laterality: Right;       Family History  Problem Relation Age of Onset  . Kidney failure Father        Renal transplant  . Diabetes  Other        Family on father's side    Social History   Tobacco Use  . Smoking status: Current Every Day Smoker    Packs/day: 0.50    Types: Cigars, Cigarettes  . Smokeless tobacco: Never Used  Vaping Use  . Vaping Use: Never used  Substance Use Topics  . Alcohol use: Yes    Comment: "often"  . Drug use: Yes    Types: Marijuana    Comment: daily, hallucinagenics    Home Medications Prior to Admission medications   Not on File    Allergies    Vicks vaporub [camph-eucalypt-men-turp-pet]  Review of Systems   Review of Systems  Unable to perform ROS: Mental status change    Physical Exam Updated Vital Signs BP (!) 145/82   Pulse 78   Temp 98 F (36.7 C) (Oral)   Resp 18   Ht 5\' 8"  (1.727 m)   Wt 59 kg   SpO2 93%   BMI 19.78 kg/m   Physical Exam Vitals and nursing note reviewed.  Constitutional:      Appearance: Normal appearance. He is well-developed.     Comments: Appears intoxicated. Responds to verbal stimuli.   HENT:     Head: Normocephalic and atraumatic.  Eyes:     General: Lids are normal.     Conjunctiva/sclera: Conjunctivae normal.     Pupils: Pupils are equal, round, and reactive to light.     Comments: Pupils 2 mm bilaterally.   Cardiovascular:  Rate and Rhythm: Normal rate and regular rhythm.     Pulses: Normal pulses.     Heart sounds: Normal heart sounds. No murmur heard. No friction rub. No gallop.   Pulmonary:     Effort: Pulmonary effort is normal.     Breath sounds: Normal breath sounds.     Comments: Lungs clear to auscultation bilaterally.  Symmetric chest rise.  No wheezing, rales, rhonchi. Abdominal:     Palpations: Abdomen is soft. Abdomen is not rigid.     Tenderness: There is no abdominal tenderness. There is no guarding.     Comments: Abdomen is soft, non-distended, non-tender. No rigidity, No guarding. No peritoneal signs.  Musculoskeletal:        General: Normal range of motion.     Cervical back: Full passive range  of motion without pain.  Skin:    General: Skin is warm and dry.     Capillary Refill: Capillary refill takes less than 2 seconds.  Neurological:     Mental Status: He is alert and oriented to person, place, and time.     Comments: Alert and oriented x 3. Able to answer my questions.  Follows commands, Moves all extremities  5/5 strength to BUE and BLE  Sensation intact throughout all major nerve distributions Responds to verbal stimuli.   Psychiatric:        Speech: Speech normal.     ED Results / Procedures / Treatments   Labs (all labs ordered are listed, but only abnormal results are displayed) Labs Reviewed  COMPREHENSIVE METABOLIC PANEL  CBC WITH DIFFERENTIAL/PLATELET  RAPID URINE DRUG SCREEN, HOSP PERFORMED    EKG None  Radiology No results found.  Procedures Procedures   Medications Ordered in ED Medications  sodium chloride 0.9 % bolus 1,000 mL (1,000 mLs Intravenous Patient Refused/Not Given 04/19/20 2101)  thiamine (B-1) injection 100 mg (100 mg Intravenous Patient Refused/Not Given 04/19/20 2101)  ondansetron (ZOFRAN) injection 4 mg (4 mg Intravenous Patient Refused/Not Given 04/19/20 2101)  promethazine (PHENERGAN) tablet 25 mg (0 mg Oral Hold 04/19/20 2137)    ED Course  I have reviewed the triage vital signs and the nursing notes.  Pertinent labs & imaging results that were available during my care of the patient were reviewed by me and considered in my medical decision making (see chart for details).    MDM Rules/Calculators/A&P                          25 year old male brought in by EMS for evaluation of unresponsiveness.  He does endorse snorting heroin today.  He also states he drank half of a 40 of alcohol.  Patient is afebril, non-toxic appearing, sitting comfortably on examination table. Vital signs reviewed and stable.  He is drowsy but responsive to verbal stimuli.  He does appear intoxicated.  We will plan to check labs, give fluids.  CBC and  CMP within normal limits. Will plan to PO challenge patient.   Patient attempted to walk in was about to fall.  At this time, he still intoxicated.  We will continue to observe him here in the ED.  Patient signed out to oncoming provider.   Portions of this note were generated with Scientist, clinical (histocompatibility and immunogenetics). Dictation errors may occur despite best attempts at proofreading.  Final Clinical Impression(s) / ED Diagnoses Final diagnoses:  Alcoholic intoxication without complication (HCC)  Polysubstance abuse (HCC)    Rx / DC Orders ED Discharge  Orders    None       Rosana Hoes 04/19/20 2206    Derwood Kaplan, MD 04/21/20 1419
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
## 2020-04-19 NOTE — ED Notes (Signed)
Pt began to vomit after drinking juice very quickly.  Pt vomited 3 times and then asked for a phenergan tablet, a bus pass and wants to eat at [employer].  Provider aware.  Phenergan ordered.

## 2020-04-19 NOTE — ED Notes (Signed)
Pt becoming verbally abusive to this RN, staggering in hall way refusing to sit back in bed.  Securing and charge nurse arrived to assist.

## 2020-04-19 NOTE — ED Notes (Signed)
Pt moving around in bed, asking for juice.  Juice provided. Side rails up x2.

## 2020-04-20 NOTE — ED Notes (Signed)
Patient denies pain and is resting comfortably.  

## 2020-04-20 NOTE — ED Notes (Signed)
Patient denies pain and is resting comfortably. Side rails up x2.

## 2020-04-20 NOTE — ED Notes (Signed)
Pt tolerating po intake- sandwich, soda and crackers.

## 2020-04-20 NOTE — ED Provider Notes (Signed)
5:23 AM Passed PO challenge.  Ambulates without difficulty.  Requesting discharge.  Appears stable for discharge.   Roxy Horseman, PA-C 04/20/20 0523    Nira Conn, MD 04/21/20 [phone]

## 2020-04-20 NOTE — ED Notes (Signed)
Patient is resting comfortably. 

## 2020-04-20 NOTE — ED Notes (Signed)
Pt stating he does not feel nauseated anymore and would like a sandwich and some ginger ale, items provided to pt.

## 2020-04-20 NOTE — ED Notes (Signed)
Pt requested to have IV removed and inquired about why he wasn't discharged. RN attempted to explain the process and that we need discharge orders placed by provider. Pt became uncooperative and was verbally aggressive.

## 2020-04-20 NOTE — ED Notes (Signed)
Pt tolerating po intake now (sprite soda beverage).

## 2020-04-20 NOTE — ED Notes (Signed)
Pt now wanting IV that he refused previously tonight.  Placed IV and hung IV fluids that were previously ordered, pt has vomited approximately 5 times tonight.

## 2020-04-20 NOTE — ED Notes (Signed)
Pt ambulated in hall from ED stretcher to restroom with steady gait.  Pt a&ox4.  Provider aware.

## 2020-05-05 ENCOUNTER — Emergency Department (HOSPITAL_COMMUNITY)
Admission: EM | Admit: 2020-05-05 | Discharge: 2020-05-05 | Disposition: A | Discharge status: Home / Self Care | Payer: Self-pay | Attending: Emergency Medicine | Admitting: Emergency Medicine

## 2020-05-05 ENCOUNTER — Other Ambulatory Visit: Payer: Self-pay

## 2020-05-05 ENCOUNTER — Encounter (HOSPITAL_COMMUNITY): Payer: Self-pay | Admitting: Emergency Medicine

## 2020-05-05 ENCOUNTER — Emergency Department (HOSPITAL_COMMUNITY): Payer: Self-pay

## 2020-05-05 DIAGNOSIS — S022XXA Fracture of nasal bones, initial encounter for closed fracture: Secondary | ICD-10-CM | POA: Insufficient documentation

## 2020-05-05 DIAGNOSIS — S0501XA Injury of conjunctiva and corneal abrasion without foreign body, right eye, initial encounter: Secondary | ICD-10-CM | POA: Insufficient documentation

## 2020-05-05 DIAGNOSIS — F17210 Nicotine dependence, cigarettes, uncomplicated: Secondary | ICD-10-CM | POA: Insufficient documentation

## 2020-05-05 MED ORDER — FLUORESCEIN SODIUM 1 MG OP STRP
1.0000 | ORAL_STRIP | Freq: Once | OPHTHALMIC | Status: AC
  Administered 2020-05-05: 1 via OPHTHALMIC
  Filled 2020-05-05: qty 1

## 2020-05-05 MED ORDER — ERYTHROMYCIN 5 MG/GM OP OINT: TOPICAL_OINTMENT | OPHTHALMIC | 0 refills | Status: DC

## 2020-05-05 MED ORDER — ERYTHROMYCIN 5 MG/GM OP OINT
TOPICAL_OINTMENT | Freq: Once | OPHTHALMIC | Status: AC
  Filled 2020-05-05: qty 3.5

## 2020-05-05 MED ORDER — TETRACAINE HCL 0.5 % OP SOLN
2.0000 [drp] | Freq: Once | OPHTHALMIC | Status: AC
  Administered 2020-05-05: 2 [drp] via OPHTHALMIC
  Filled 2020-05-05: qty 4

## 2020-05-05 NOTE — Discharge Instructions (Addendum)
As we discussed, your exam today showed a small scratch on your eye.  This is likely contributing to your symptoms.  Use the antibiotic ointment as directed.  It is very important for you to follow-up with referred to ophthalmologist tomorrow.  He will plan to see you at his office at 815.  Additionally, your imaging today showed you had a nasal fracture.  Please follow-up with ENT regarding this.  Do not blow your nose, sneeze.  Return emergency department any worsening vision changes, worsening eye pain or new or worsening concerning symptoms.
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
## 2020-05-05 NOTE — ED Notes (Signed)
Patient provided with sandwich, crackers, and peanut butter at time of discharge.
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
## 2020-05-08 ENCOUNTER — Other Ambulatory Visit: Payer: Self-pay

## 2020-05-08 ENCOUNTER — Emergency Department (HOSPITAL_COMMUNITY)
Admission: EM | Admit: 2020-05-08 | Discharge: 2020-05-08 | Disposition: A | Discharge status: Home / Self Care | Payer: Medicaid Other | Attending: Emergency Medicine | Admitting: Emergency Medicine

## 2020-05-08 ENCOUNTER — Encounter (HOSPITAL_COMMUNITY): Payer: Self-pay

## 2020-05-08 DIAGNOSIS — Z1339 Encounter for screening examination for other mental health and behavioral disorders: Secondary | ICD-10-CM | POA: Insufficient documentation

## 2020-05-08 DIAGNOSIS — Z0000 Encounter for general adult medical examination without abnormal findings: Secondary | ICD-10-CM

## 2020-05-08 DIAGNOSIS — F332 Major depressive disorder, recurrent severe without psychotic features: Secondary | ICD-10-CM | POA: Insufficient documentation

## 2020-05-08 DIAGNOSIS — F1910 Other psychoactive substance abuse, uncomplicated: Secondary | ICD-10-CM | POA: Insufficient documentation

## 2020-05-08 DIAGNOSIS — F17210 Nicotine dependence, cigarettes, uncomplicated: Secondary | ICD-10-CM | POA: Insufficient documentation

## 2020-05-08 DIAGNOSIS — J3489 Other specified disorders of nose and nasal sinuses: Secondary | ICD-10-CM | POA: Insufficient documentation

## 2020-05-08 DIAGNOSIS — H5711 Ocular pain, right eye: Secondary | ICD-10-CM | POA: Insufficient documentation

## 2020-05-08 DIAGNOSIS — Z20822 Contact with and (suspected) exposure to covid-19: Secondary | ICD-10-CM | POA: Insufficient documentation

## 2020-05-08 LAB — COMPREHENSIVE METABOLIC PANEL
ALT: 29 U/L (ref 0–44)
AST: 24 U/L (ref 15–41)
Albumin: 3.9 g/dL (ref 3.5–5.0)
Alkaline Phosphatase: 40 U/L (ref 38–126)
Anion gap: 8 (ref 5–15)
BUN: 14 mg/dL (ref 6–20)
CO2: 27 mmol/L (ref 22–32)
Calcium: 9 mg/dL (ref 8.9–10.3)
Chloride: 103 mmol/L (ref 98–111)
Creatinine, Ser: 0.86 mg/dL (ref 0.61–1.24)
GFR, Estimated: >60 mL/min (ref >60)
Glucose, Bld: 107 mg/dL — ABNORMAL HIGH (ref 70–99)
Potassium: 3.9 mmol/L (ref 3.5–5.1)
Sodium: 138 mmol/L (ref 135–145)
Total Bilirubin: 0.8 mg/dL (ref 0.3–1.2)
Total Protein: 6.8 g/dL (ref 6.5–8.1)

## 2020-05-08 LAB — CBC WITH DIFFERENTIAL/PLATELET
Abs Immature Granulocytes: 0.01 10*3/uL (ref 0.00–0.07)
Basophils Absolute: 0.1 10*3/uL (ref 0.0–0.1)
Basophils Relative: 1 %
Eosinophils Absolute: 0.2 10*3/uL (ref 0.0–0.5)
Eosinophils Relative: 3 %
HCT: 43.8 % (ref 39.0–52.0)
Hemoglobin: 14.3 g/dL (ref 13.0–17.0)
Immature Granulocytes: 0 %
Lymphocytes Relative: 32 %
Lymphs Abs: 2.5 10*3/uL (ref 0.7–4.0)
MCH: 31.4 pg (ref 26.0–34.0)
MCHC: 32.6 g/dL (ref 30.0–36.0)
MCV: 96.1 fL (ref 80.0–100.0)
Monocytes Absolute: 0.5 10*3/uL (ref 0.1–1.0)
Monocytes Relative: 6 %
Neutro Abs: 4.5 10*3/uL (ref 1.7–7.7)
Neutrophils Relative %: 58 %
Platelets: 287 10*3/uL (ref 150–400)
RBC: 4.56 MIL/uL (ref 4.22–5.81)
RDW: 12.6 % (ref 11.5–15.5)
WBC: 7.8 10*3/uL (ref 4.0–10.5)
nRBC: 0 % (ref 0.0–0.2)

## 2020-05-08 LAB — RAPID URINE DRUG SCREEN, HOSP PERFORMED
Amphetamines: NOT DETECTED
Barbiturates: NOT DETECTED
Benzodiazepines: NOT DETECTED
Cocaine: POSITIVE — AB
Opiates: NOT DETECTED
Tetrahydrocannabinol: POSITIVE — AB

## 2020-05-08 LAB — ETHANOL: Alcohol, Ethyl (B): <10 mg/dL (ref <10)

## 2020-05-08 LAB — RESP PANEL BY RT-PCR (FLU A&B, COVID) ARPGX2
Influenza A by PCR: NEGATIVE
Influenza B by PCR: NEGATIVE
SARS Coronavirus 2 by RT PCR: NEGATIVE

## 2020-05-08 MED ORDER — IBUPROFEN 800 MG PO TABS
800.0000 mg | ORAL_TABLET | Freq: Once | ORAL | Status: AC
  Administered 2020-05-08: 800 mg via ORAL
  Filled 2020-05-08: qty 1

## 2020-05-08 MED ORDER — POLYMYXIN B-TRIMETHOPRIM 10000-0.1 UNIT/ML-% OP SOLN
2.0000 [drp] | OPHTHALMIC | Status: DC
  Administered 2020-05-08 (×3): 2 [drp] via OPHTHALMIC
  Filled 2020-05-08: qty 10

## 2020-05-08 MED ORDER — ERYTHROMYCIN 5 MG/GM OP OINT
TOPICAL_OINTMENT | Freq: Once | OPHTHALMIC | Status: DC
  Filled 2020-05-08: qty 3.5

## 2020-05-08 MED ORDER — POLYMYXIN B-TRIMETHOPRIM 10000-0.1 UNIT/ML-% OP SOLN: 1.0000 [drp] | Freq: Four times a day (QID) | OPHTHALMIC | 0 refills | Status: DC

## 2020-05-08 MED ORDER — TETRACAINE HCL 0.5 % OP SOLN
2.0000 [drp] | Freq: Once | OPHTHALMIC | Status: AC
  Administered 2020-05-08: 2 [drp] via OPHTHALMIC
  Filled 2020-05-08: qty 4

## 2020-05-08 MED ORDER — FLUORESCEIN SODIUM 1 MG OP STRP
1.0000 | ORAL_STRIP | Freq: Once | OPHTHALMIC | Status: AC
  Administered 2020-05-08: 1 via OPHTHALMIC
  Filled 2020-05-08: qty 1

## 2020-05-08 NOTE — BH Assessment (Signed)
[facility] Assessment Progress Note  Per Serena Colonel, NP, this voluntary pt does not require psychiatric hospitalization at this time.  Pt is psychiatrically cleared.  Discharge instructions include referral information for [facility], has last provider, as well as [facility].  A TOC consult is to be ordered to address pt's psychosocial needs.  EDP Coralee Pesa, DO, and pt's nurse, Cammy Copa, have been notified.  Doylene Canning, MA Triage Specialist [phone]

## 2020-05-08 NOTE — ED Notes (Signed)
Unable to obtain temp at this time on patient.

## 2020-05-08 NOTE — BH Assessment (Addendum)
Requested nursing Cammy Copa, RN) via secure chat to place TTS machine in patient's room for his initial TTS assessment.

## 2020-05-08 NOTE — Progress Notes (Signed)
TOC CM spoke to pt and he declined [facility] transportation to [facility] [employer]. He requested bus pass. Bus pass was provided. Isidoro Donning RN CCM, [facility] ED TOC CM [phone]

## 2020-05-08 NOTE — Discharge Instructions (Addendum)
Use the provided eyedrops as directed.  You must follow-up with ophthalmology at the listed site for re valuation of your eye and response to treatment.  If you have any worsening symptoms please return for evaluation.  For your behavioral health needs you are advised to follow up with one of the following providers.  Both offer psychiatry and therapy.  Contact them at your earliest opportunity to schedule an intake appointment:       [facility]      [address]      [place], [place] [address]      [phone]      New patients are seen at their walk-in clinic.  Walk-in hours are Monday - Friday from 8:30 am - 12:00 pm, and from 1:00 pm - 2:30 pm.  Walk-in patients are seen on a first come, first served basis, so try to arrive as early as possible for the best chance of being seen the same day:       [facility]      [address]      [place], [place] [address]      [phone]     New patients are seen in their walk-in clinic.  Walk-in hours are Monday - Thursday from 8:00 am - 11:00 am for psychiatry, and Friday from 1:00 pm - 4:00 pm for therapy.  Walk-in patients are seen on a first come, first served basis, so try to arrive as early as possible for the best chance of being seen the same day.
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
## 2020-05-08 NOTE — ED Notes (Signed)
TTS on computer at bedside.

## 2020-05-08 NOTE — ED Notes (Signed)
Pt stated that he could see nothing on the card. Stated it was all blurry

## 2020-05-08 NOTE — BH Assessment (Signed)
Clinician  made another attempt to call the TTS machine. No answer. Notified nursing via secure of attempt.

## 2020-05-08 NOTE — BH Assessment (Signed)
Contacted [facility] by phone to speak with patient's nurse. Placed on hold.   TTS Clinician called [facility] SPARE cart x3 and no answer.
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
## 2020-05-08 NOTE — ED Notes (Signed)
Pt ambulatory to room unassisted
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
## 2020-05-26 ENCOUNTER — Encounter (HOSPITAL_COMMUNITY): Payer: Self-pay | Admitting: Emergency Medicine

## 2020-05-26 ENCOUNTER — Other Ambulatory Visit: Payer: Self-pay

## 2020-05-26 ENCOUNTER — Emergency Department (HOSPITAL_COMMUNITY): Payer: Medicaid Other

## 2020-05-26 ENCOUNTER — Emergency Department (HOSPITAL_COMMUNITY)
Admission: EM | Admit: 2020-05-26 | Discharge: 2020-05-27 | Disposition: A | Discharge status: Home / Self Care | Payer: Medicaid Other | Attending: Emergency Medicine | Admitting: Emergency Medicine

## 2020-05-26 DIAGNOSIS — F1990 Other psychoactive substance use, unspecified, uncomplicated: Secondary | ICD-10-CM

## 2020-05-26 DIAGNOSIS — F1999 Other psychoactive substance use, unspecified with unspecified psychoactive substance-induced disorder: Secondary | ICD-10-CM | POA: Insufficient documentation

## 2020-05-26 DIAGNOSIS — F17210 Nicotine dependence, cigarettes, uncomplicated: Secondary | ICD-10-CM | POA: Insufficient documentation

## 2020-05-26 LAB — COMPREHENSIVE METABOLIC PANEL
ALT: 31 U/L (ref 0–44)
AST: 23 U/L (ref 15–41)
Albumin: 4.1 g/dL (ref 3.5–5.0)
Alkaline Phosphatase: 42 U/L (ref 38–126)
Anion gap: 9 (ref 5–15)
BUN: 20 mg/dL (ref 6–20)
CO2: 28 mmol/L (ref 22–32)
Calcium: 9.4 mg/dL (ref 8.9–10.3)
Chloride: 106 mmol/L (ref 98–111)
Creatinine, Ser: 1.09 mg/dL (ref 0.61–1.24)
GFR, Estimated: >60 mL/min (ref >60)
Glucose, Bld: 87 mg/dL (ref 70–99)
Potassium: 4.3 mmol/L (ref 3.5–5.1)
Sodium: 143 mmol/L (ref 135–145)
Total Bilirubin: 0.5 mg/dL (ref 0.3–1.2)
Total Protein: 7.1 g/dL (ref 6.5–8.1)

## 2020-05-26 LAB — CBC
HCT: 40.7 % (ref 39.0–52.0)
Hemoglobin: 13.2 g/dL (ref 13.0–17.0)
MCH: 31.1 pg (ref 26.0–34.0)
MCHC: 32.4 g/dL (ref 30.0–36.0)
MCV: 96 fL (ref 80.0–100.0)
Platelets: 244 10*3/uL (ref 150–400)
RBC: 4.24 MIL/uL (ref 4.22–5.81)
RDW: 12.5 % (ref 11.5–15.5)
WBC: 8.5 10*3/uL (ref 4.0–10.5)
nRBC: 0 % (ref 0.0–0.2)
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
## 2020-05-27 LAB — ETHANOL: Alcohol, Ethyl (B): <10 mg/dL (ref <10)

## 2020-05-27 LAB — ACETAMINOPHEN LEVEL: Acetaminophen (Tylenol), Serum: <10 ug/mL — ABNORMAL LOW (ref 10–30)

## 2020-05-27 LAB — SALICYLATE LEVEL: Salicylate Lvl: <7 mg/dL — ABNORMAL LOW (ref 7.0–30.0)
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
## 2020-05-30 ENCOUNTER — Other Ambulatory Visit: Payer: Self-pay

## 2020-05-30 ENCOUNTER — Emergency Department (HOSPITAL_COMMUNITY)
Admission: EM | Admit: 2020-05-30 | Discharge: 2020-05-30 | Disposition: A | Discharge status: Home / Self Care | Payer: Medicaid Other | Attending: Emergency Medicine | Admitting: Emergency Medicine

## 2020-05-30 DIAGNOSIS — F17210 Nicotine dependence, cigarettes, uncomplicated: Secondary | ICD-10-CM | POA: Insufficient documentation

## 2020-05-30 DIAGNOSIS — F1910 Other psychoactive substance abuse, uncomplicated: Secondary | ICD-10-CM | POA: Insufficient documentation
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
## 2020-05-30 NOTE — Discharge Instructions (Addendum)
Please try to avoid any illicit drug use as it is bad for your health.  Please review the attached documents on local resources for substance use programs.  Return to the ED or seek immediate medical attention should you experience any new or worsening symptoms.
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
## 2020-05-30 NOTE — ED Notes (Signed)
Pt received breakfast trey
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
## 2020-06-22 ENCOUNTER — Other Ambulatory Visit: Payer: Self-pay

## 2020-06-22 ENCOUNTER — Emergency Department (HOSPITAL_COMMUNITY)
Admission: EM | Admit: 2020-06-22 | Discharge: 2020-06-23 | Disposition: A | Discharge status: Home / Self Care | Payer: Medicaid Other | Attending: Emergency Medicine | Admitting: Emergency Medicine

## 2020-06-22 DIAGNOSIS — T50901A Poisoning by unspecified drugs, medicaments and biological substances, accidental (unintentional), initial encounter: Secondary | ICD-10-CM | POA: Insufficient documentation

## 2020-06-22 DIAGNOSIS — T6591XA Toxic effect of unspecified substance, accidental (unintentional), initial encounter: Secondary | ICD-10-CM

## 2020-06-22 DIAGNOSIS — F17210 Nicotine dependence, cigarettes, uncomplicated: Secondary | ICD-10-CM | POA: Insufficient documentation

## 2020-06-22 DIAGNOSIS — X58XXXA Exposure to other specified factors, initial encounter: Secondary | ICD-10-CM | POA: Insufficient documentation

## 2020-06-22 DIAGNOSIS — F17290 Nicotine dependence, other tobacco product, uncomplicated: Secondary | ICD-10-CM | POA: Insufficient documentation

## 2020-06-22 DIAGNOSIS — T162XXA Foreign body in left ear, initial encounter: Secondary | ICD-10-CM

## 2020-06-23 ENCOUNTER — Encounter (HOSPITAL_COMMUNITY): Payer: Self-pay | Admitting: Emergency Medicine

## 2020-06-23 NOTE — ED Notes (Signed)
Foreign body removed from left ear
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
## 2020-06-23 NOTE — ED Triage Notes (Signed)
Pt BIB [facility]. Pt reports that he tried to clean the wax out of his ear with a cigarette butt and it broke off in his ear. Also reports that he took one unknown pill, possible carvedilol because he thought it was a pain killer.

## 2020-06-23 NOTE — Discharge Instructions (Addendum)
Do not take medication that is not prescribed to you. Do not take medication that you find lying around, especially if you do not know what the medication is.

## 2020-08-24 ENCOUNTER — Emergency Department (HOSPITAL_COMMUNITY)
Admission: EM | Admit: 2020-08-24 | Discharge: 2020-08-24 | Disposition: A | Discharge status: Home / Self Care | Payer: Self-pay | Attending: Emergency Medicine | Admitting: Emergency Medicine

## 2020-08-24 ENCOUNTER — Encounter (HOSPITAL_COMMUNITY): Payer: Self-pay | Admitting: Emergency Medicine

## 2020-08-24 ENCOUNTER — Emergency Department (HOSPITAL_COMMUNITY): Payer: Self-pay

## 2020-08-24 ENCOUNTER — Other Ambulatory Visit: Payer: Self-pay

## 2020-08-24 DIAGNOSIS — F1490 Cocaine use, unspecified, uncomplicated: Secondary | ICD-10-CM

## 2020-08-24 DIAGNOSIS — R110 Nausea: Secondary | ICD-10-CM | POA: Insufficient documentation

## 2020-08-24 DIAGNOSIS — R5383 Other fatigue: Secondary | ICD-10-CM | POA: Insufficient documentation

## 2020-08-24 DIAGNOSIS — R4182 Altered mental status, unspecified: Secondary | ICD-10-CM | POA: Insufficient documentation

## 2020-08-24 DIAGNOSIS — F1290 Cannabis use, unspecified, uncomplicated: Secondary | ICD-10-CM

## 2020-08-24 DIAGNOSIS — F17210 Nicotine dependence, cigarettes, uncomplicated: Secondary | ICD-10-CM | POA: Insufficient documentation

## 2020-08-24 DIAGNOSIS — Y900 Blood alcohol level of less than 20 mg/100 ml: Secondary | ICD-10-CM | POA: Insufficient documentation

## 2020-08-24 LAB — CBC WITH DIFFERENTIAL/PLATELET
Abs Immature Granulocytes: 0.03 10*3/uL (ref 0.00–0.07)
Basophils Absolute: 0.1 10*3/uL (ref 0.0–0.1)
Basophils Relative: 1 %
Eosinophils Absolute: 0.1 10*3/uL (ref 0.0–0.5)
Eosinophils Relative: 1 %
HCT: 36.2 % — ABNORMAL LOW (ref 39.0–52.0)
Hemoglobin: 12.1 g/dL — ABNORMAL LOW (ref 13.0–17.0)
Immature Granulocytes: 0 %
Lymphocytes Relative: 26 %
Lymphs Abs: 1.9 10*3/uL (ref 0.7–4.0)
MCH: 30.9 pg (ref 26.0–34.0)
MCHC: 33.4 g/dL (ref 30.0–36.0)
MCV: 92.6 fL (ref 80.0–100.0)
Monocytes Absolute: 0.7 10*3/uL (ref 0.1–1.0)
Monocytes Relative: 9 %
Neutro Abs: 4.8 10*3/uL (ref 1.7–7.7)
Neutrophils Relative %: 63 %
Platelets: 183 10*3/uL (ref 150–400)
RBC: 3.91 MIL/uL — ABNORMAL LOW (ref 4.22–5.81)
RDW: 12.3 % (ref 11.5–15.5)
WBC: 7.6 10*3/uL (ref 4.0–10.5)
nRBC: 0 % (ref 0.0–0.2)

## 2020-08-24 LAB — COMPREHENSIVE METABOLIC PANEL
ALT: 22 U/L (ref 0–44)
AST: 52 U/L — ABNORMAL HIGH (ref 15–41)
Albumin: 3.7 g/dL (ref 3.5–5.0)
Alkaline Phosphatase: 31 U/L — ABNORMAL LOW (ref 38–126)
Anion gap: 6 (ref 5–15)
BUN: 23 mg/dL — ABNORMAL HIGH (ref 6–20)
CO2: 27 mmol/L (ref 22–32)
Calcium: 8.2 mg/dL — ABNORMAL LOW (ref 8.9–10.3)
Chloride: 107 mmol/L (ref 98–111)
Creatinine, Ser: 1.06 mg/dL (ref 0.61–1.24)
GFR, Estimated: >60 mL/min (ref >60)
Glucose, Bld: 168 mg/dL — ABNORMAL HIGH (ref 70–99)
Potassium: 4.4 mmol/L (ref 3.5–5.1)
Sodium: 140 mmol/L (ref 135–145)
Total Bilirubin: 1.5 mg/dL — ABNORMAL HIGH (ref 0.3–1.2)
Total Protein: 6.3 g/dL — ABNORMAL LOW (ref 6.5–8.1)

## 2020-08-24 LAB — URINALYSIS, ROUTINE W REFLEX MICROSCOPIC
Bilirubin Urine: NEGATIVE
Glucose, UA: NEGATIVE mg/dL
Hgb urine dipstick: NEGATIVE
Ketones, ur: NEGATIVE mg/dL
Leukocytes,Ua: NEGATIVE
Nitrite: NEGATIVE
Protein, ur: NEGATIVE mg/dL
Specific Gravity, Urine: 1.023 (ref 1.005–1.030)
pH: 7 (ref 5.0–8.0)

## 2020-08-24 LAB — RAPID URINE DRUG SCREEN, HOSP PERFORMED
Amphetamines: NOT DETECTED
Barbiturates: NOT DETECTED
Benzodiazepines: NOT DETECTED
Cocaine: POSITIVE — AB
Opiates: NOT DETECTED
Tetrahydrocannabinol: POSITIVE — AB

## 2020-08-24 LAB — CBG MONITORING, ED: Glucose-Capillary: 148 mg/dL — ABNORMAL HIGH (ref 70–99)

## 2020-08-24 LAB — LACTIC ACID, PLASMA: Lactic Acid, Venous: 0.6 mmol/L (ref 0.5–1.9)

## 2020-08-24 LAB — ETHANOL: Alcohol, Ethyl (B): <10 mg/dL (ref <10)

## 2020-08-24 MED ORDER — SODIUM CHLORIDE 0.9 % IV BOLUS
1000.0000 mL | Freq: Once | INTRAVENOUS | Status: AC
  Administered 2020-08-24: 1000 mL via INTRAVENOUS

## 2020-08-24 MED ORDER — SODIUM CHLORIDE 0.9 % IV BOLUS: 1000.0000 mL | Freq: Once | INTRAVENOUS | Status: DC

## 2020-08-24 MED ORDER — SODIUM CHLORIDE 0.9 % IV BOLUS
500.0000 mL | Freq: Once | INTRAVENOUS | Status: AC
  Administered 2020-08-24: 500 mL via INTRAVENOUS

## 2020-08-24 NOTE — ED Provider Notes (Signed)
Care assumed from [facility], PA-C at shift change with urine pending.   In brief, this patient is a 25 y.o. M with PMH/o homelessness who presents for evaluation of altered mental status.  Per triage, patient was found by bystanders in a parking lot.  On EMS arrival, he was alert only to person.  Per previous provider, patient is alert to self only.  He is unable to tell how he got to the parking lot.  He reports some nausea but denies any fevers, cough, chest pain, shortness of breath, vomiting, abdominal pain.  Please see note from previous provider for full history/physical exam.  Physical Exam  BP 115/73 (BP Location: Right Arm)   Pulse (!) 53   Temp 98.2 F (36.8 C) (Oral)   Resp 18   Ht 5\' 8"  (1.727 m)   Wt 59 kg   SpO2 99%   BMI 19.78 kg/m   Physical Exam  Laying on bed comfortably No slurred speech. Speaking in full sentences.   ED Course/Procedures     Procedures  Results for orders placed or performed during the hospital encounter of 08/24/20 (from the past 24 hour(s))  CBG monitoring, ED     Status: Abnormal   Collection Time: 08/24/20 12:34 PM  Result Value Ref Range   Glucose-Capillary 148 (H) 70 - 99 mg/dL  CBC with Differential     Status: Abnormal   Collection Time: 08/24/20 12:47 PM  Result Value Ref Range   WBC 7.6 4.0 - 10.5 K/uL   RBC 3.91 (L) 4.22 - 5.81 MIL/uL   Hemoglobin 12.1 (L) 13.0 - 17.0 g/dL   HCT 08/26/20 (L) 72.5 - 36.6 %   MCV 92.6 80.0 - 100.0 fL   MCH 30.9 26.0 - 34.0 pg   MCHC 33.4 30.0 - 36.0 g/dL   RDW 44.0 34.7 - 42.5 %   Platelets 183 150 - 400 K/uL   nRBC 0.0 0.0 - 0.2 %   Neutrophils Relative % 63 %   Neutro Abs 4.8 1.7 - 7.7 K/uL   Lymphocytes Relative 26 %   Lymphs Abs 1.9 0.7 - 4.0 K/uL   Monocytes Relative 9 %   Monocytes Absolute 0.7 0.1 - 1.0 K/uL   Eosinophils Relative 1 %   Eosinophils Absolute 0.1 0.0 - 0.5 K/uL   Basophils Relative 1 %   Basophils Absolute 0.1 0.0 - 0.1 K/uL   Immature Granulocytes 0 %   Abs  Immature Granulocytes 0.03 0.00 - 0.07 K/uL  Comprehensive metabolic panel     Status: Abnormal   Collection Time: 08/24/20 12:47 PM  Result Value Ref Range   Sodium 140 135 - 145 mmol/L   Potassium 4.4 3.5 - 5.1 mmol/L   Chloride 107 98 - 111 mmol/L   CO2 27 22 - 32 mmol/L   Glucose, Bld 168 (H) 70 - 99 mg/dL   BUN 23 (H) 6 - 20 mg/dL   Creatinine, Ser 08/26/20 0.61 - 1.24 mg/dL   Calcium 8.2 (L) 8.9 - 10.3 mg/dL   Total Protein 6.3 (L) 6.5 - 8.1 g/dL   Albumin 3.7 3.5 - 5.0 g/dL   AST 52 (H) 15 - 41 U/L   ALT 22 0 - 44 U/L   Alkaline Phosphatase 31 (L) 38 - 126 U/L   Total Bilirubin 1.5 (H) 0.3 - 1.2 mg/dL   GFR, Estimated 3.87 >56 mL/min   Anion gap 6 5 - 15  Ethanol     Status: None   Collection  Time: 08/24/20 12:48 PM  Result Value Ref Range   Alcohol, Ethyl (B) <10 <10 mg/dL  Lactic acid, plasma     Status: None   Collection Time: 08/24/20  2:00 PM  Result Value Ref Range   Lactic Acid, Venous 0.6 0.5 - 1.9 mmol/L  Rapid urine drug screen (hospital performed)     Status: Abnormal   Collection Time: 08/24/20  4:59 PM  Result Value Ref Range   Opiates NONE DETECTED NONE DETECTED   Cocaine POSITIVE (A) NONE DETECTED   Benzodiazepines NONE DETECTED NONE DETECTED   Amphetamines NONE DETECTED NONE DETECTED   Tetrahydrocannabinol POSITIVE (A) NONE DETECTED   Barbiturates NONE DETECTED NONE DETECTED  Urinalysis, Routine w reflex microscopic     Status: None   Collection Time: 08/24/20  4:59 PM  Result Value Ref Range   Color, Urine YELLOW YELLOW   APPearance CLEAR CLEAR   Specific Gravity, Urine 1.023 1.005 - 1.030   pH 7.0 5.0 - 8.0   Glucose, UA NEGATIVE NEGATIVE mg/dL   Hgb urine dipstick NEGATIVE NEGATIVE   Bilirubin Urine NEGATIVE NEGATIVE   Ketones, ur NEGATIVE NEGATIVE mg/dL   Protein, ur NEGATIVE NEGATIVE mg/dL   Nitrite NEGATIVE NEGATIVE   Leukocytes,Ua NEGATIVE NEGATIVE    MDM    PLAN: Patient pending urine and UDS. If positive, then MFT. If negative ...    MDM:  UA negative for infectious etiology.   UDS is positive for cocaine and benzos.   Reevaluation.  Patient is resting comfortably on bed.  No signs of distress.  Blood pressure is slightly soft.  Will give additional liter bolus.  He states that he did not eat anything today except for beef jerky.  We will plan to give him food here and reassess. He is asking me for graham crackers.  He is able to answer my questions and is able to tell me his name, his year and where he is at.  Reevaluation.  Blood pressure has improved with fluid bolus here in the ED.  I suspect  it was slightly soft because of not eating all day.  Patient resting comfortably on bed.  No signs of distress.  Patient is alert and oriented x3 and able answer my questions.  Patient stable for discharge at this time.   RN inform me that when she rechecked his vitals for discharge, his blood pressure was slightly soft again.  Patient has been sleeping.  We will plan to give him another fluid bolus and reevaluate.  Reevaluation.  Patient sleeping comfortable.  No signs of distress.  Patient is up, ambulating in the ED, alert and oriented x3.  Blood pressure is improved after fluids here in the ED.  He has eaten without any difficulty. At this time, patient exhibits no emergent life-threatening condition that require further evaluation in ED. Discussed patient with Dr. Effie Shy. Patient had ample opportunity for questions and discussion. All patient's questions were answered with full understanding. Strict return precautions discussed. Patient expresses understanding and agreement to plan.   1. Cocaine use   2. Marijuana use     Portions of this note were generated with Scientist, clinical (histocompatibility and immunogenetics). Dictation errors may occur despite best attempts at proofreading.    Maxwell Caul, PA-C 08/24/20 2242    Mancel Bale, MD 08/26/20 [phone]

## 2020-08-24 NOTE — Discharge Instructions (Addendum)
Use the resource guide to find shelters.  Return to the Emergency Dept for any chest pain, difficulty breathing, abdominal pain or any other worsening or concerning symptoms.

## 2020-08-24 NOTE — ED Triage Notes (Addendum)
Pt BIBA in front of a gas station with c/o being "unresponsive". Bystander found him and thought he was intoxicated. When EMS arrived pt was lethargic and confused. [facility] who responded stated he normally sees this pt related to "overdose" calls. There was no drugs or alcohol on scene.   18 G left AC 300 NS 118/66 CBG-138 HR-80

## 2020-08-24 NOTE — ED Notes (Signed)
Pt made aware urine sample is needed.  

## 2020-08-24 NOTE — ED Provider Notes (Signed)
[facility]-EMERGENCY DEPT Provider Note   CSN: [id number] Arrival date & time: 08/24/20  1225     History Chief Complaint  Patient presents with   Fatigue    Bryan Payne is a 25 y.o. male presenting for evaluation of altered mental status.  Level 5 caveat due to AMS.  Per triage, patient was found by bystanders unresponsive in a parking lot.  On EMS arrival, patient was only alert to person.  Patient unable to tell me what happened or how he ended up in the parking lot.  Unable to state why he is in the ER.  He reports mild nausea, but denies fever, cough, chest pain, shortness of breath, vomiting, abdominal pain, urinary symptoms, normal bowel movements.  He denies drug or alcohol use. Pt denies any injuries  Additional history obtained from chart review.  Per chart review, patient with a history of homelessness and polysubstance abuse.   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   Mushroom poisoning 12/22/2014   Acute renal failure (HCC) 12/22/2014   Cannabis abuse with cannabis-induced disorder (HCC) 12/22/2014   Dehydration 12/22/2014   Ingestion of unknown nonmedicinal substance 12/21/2014   Rectal bleeding 12/21/2014   Generalized abdominal pain 12/21/2014   Lateral meniscus tear 05/05/2013   Appendicitis, acute-gangrenous s/p laparoscopic appendectomy  10/13/2012    Past Surgical History:  Procedure Laterality Date   APPENDECTOMY     KNEE ARTHROSCOPY WITH LATERAL MENISECTOMY Left 05/05/2013   Procedure: LEFT KNEE ARTHROSCOPY WITH LATERAL MENISCAL REPAIR;  Surgeon: Eulas Post, MD;  Location: [facility];  Service: Orthopedics;  Laterality: Left;    LAPAROSCOPIC APPENDECTOMY Right 10/13/2012   Procedure: APPENDECTOMY LAPAROSCOPIC;  Surgeon: Mariella Saa, MD;  Location: [facility];  Service: General;  Laterality: Right;       Family History  Problem Relation Age of Onset   Kidney failure Father        Renal transplant   Diabetes Other        Family on father's side    Social History   Tobacco Use   Smoking status: Every Day    Packs/day: 0.50    Types: Cigars, Cigarettes   Smokeless tobacco: Never  Vaping Use   Vaping Use: Never used  Substance Use Topics   Alcohol use: Yes    Comment: "often"   Drug use: Yes    Types: Marijuana    Comment: daily, hallucinagenics    Home Medications Prior to Admission medications   Medication Sig Start Date End Date Taking? Authorizing Provider  erythromycin ophthalmic ointment Place a 1/2 inch ribbon of ointment into the lower eyelid. Patient not taking: No sig reported 05/05/20   Graciella Freer A, PA-C  trimethoprim-polymyxin b (POLYTRIM) ophthalmic solution Place 1 drop into the right eye every 6 (six) hours. Patient not taking: No sig reported 05/08/20   Horton, Kristie M, DO    Allergies    Vicks vaporub [camph-eucalypt-men-turp-pet]  Review of Systems   Review of Systems  Unable to perform ROS: Mental status change  Gastrointestinal:  Positive for nausea.  Psychiatric/Behavioral:  Positive for confusion.    Physical Exam  Updated Vital Signs BP (!) 92/54   Pulse 73   Temp 98.2 F (36.8 C) (Oral)   Resp 20   Ht 5\' 8"  (1.727 m)   Wt 59 kg   SpO2 100%   BMI 19.78 kg/m   Physical Exam Vitals and nursing note reviewed.  Constitutional:      General: He is not in acute distress.    Appearance: Normal appearance.     Comments: Resting in the bed in no acute distress  HENT:     Head: Normocephalic and atraumatic.  Eyes:     Conjunctiva/sclera: Conjunctivae normal.     Pupils: Pupils are equal, round, and reactive to light.  Neck:     Comments: Patient  arrived in c-collar.  No tenderness palpation midline C-spine.  Full active range of motion of the head without pain. Cardiovascular:     Rate and Rhythm: Normal rate and regular rhythm.     Pulses: Normal pulses.  Pulmonary:     Effort: Pulmonary effort is normal. No respiratory distress.     Breath sounds: Normal breath sounds. No wheezing.     Comments: Speaking in full sentences.  Clear lung sounds in all fields. Abdominal:     General: There is no distension.     Palpations: Abdomen is soft. There is no mass.     Tenderness: There is no abdominal tenderness. There is no guarding or rebound.  Musculoskeletal:        General: Normal range of motion.     Cervical back: Normal range of motion and neck supple.     Comments: No TTP of back or midline spine.  No step-offs or deformities.  Skin:    General: Skin is warm and dry.     Capillary Refill: Capillary refill takes less than 2 seconds.  Neurological:     Mental Status: He is alert.     GCS: GCS eye subscore is 4. GCS verbal subscore is 5. GCS motor subscore is 6.     Comments: Alert to person, however not place or time.  Appears to be choosing to respond to certain questions. When he answers, his answers are appropriate.  Psychiatric:        Mood and Affect: Mood and affect normal.        Speech: Speech normal.        Behavior: Behavior normal.    ED Results / Procedures / Treatments   Labs (all labs ordered are listed, but only abnormal results are displayed) Labs Reviewed  CBC WITH DIFFERENTIAL/PLATELET - Abnormal; Notable for the following components:      Result Value   RBC 3.91 (*)    Hemoglobin 12.1 (*)    HCT 36.2 (*)    All other components within normal limits  COMPREHENSIVE METABOLIC PANEL - Abnormal; Notable for the following components:   Glucose, Bld 168 (*)    BUN 23 (*)    Calcium 8.2 (*)    Total Protein 6.3 (*)    AST 52 (*)    Alkaline Phosphatase 31 (*)    Total Bilirubin 1.5 (*)    All other  components within normal limits  CBG MONITORING, ED - Abnormal; Notable for the following components:   Glucose-Capillary 148 (*)    All other components within normal limits  ETHANOL  RAPID URINE DRUG SCREEN, HOSP PERFORMED  URINALYSIS, ROUTINE W REFLEX MICROSCOPIC  LACTIC ACID, PLASMA  LACTIC ACID, PLASMA    EKG None  Radiology  CT Head Wo Contrast  Result Date: 08/24/2020 CLINICAL DATA:  Mental status change of uncertain etiology. EXAM: CT HEAD WITHOUT CONTRAST TECHNIQUE: Contiguous axial images were obtained from the base of the skull through the vertex without intravenous contrast. COMPARISON:  May 26, 2020 FINDINGS: Brain: No evidence of acute infarction, hemorrhage, hydrocephalus, extra-axial collection or mass lesion/mass effect. Vascular: No hyperdense vessel or unexpected calcification. Skull: Normal. Negative for fracture or focal lesion. Sinuses/Orbits: No acute finding. Other: None. IMPRESSION: No cause for the patient's symptoms identified. Electronically Signed   By: Gerome Sam III M.D   On: 08/24/2020 13:37    Procedures Procedures   Medications Ordered in ED Medications  sodium chloride 0.9 % bolus 1,000 mL (has no administration in time range)    ED Course  I have reviewed the triage vital signs and the nursing notes.  Pertinent labs & imaging results that were available during my care of the patient were reviewed by me and considered in my medical decision making (see chart for details).    MDM Rules/Calculators/A&P                          Pt presenting for altered mental status. On exam, pt alert to self. No signs of injury. No focal neuro deficit.  Considering his history, most likely some form of substance abuse.  However as history is unclear, will obtain labs, head CT, urine, UDS, EKG.  Labs interpreted by me, overall reassuring.  EKG nonischemic.  Head CT negative.  Patient's blood pressure trending down, blood pressure in the low 90s.  Will add  on lactic and chest x-ray, and give a fluid bolus. Per chart review, pt's BP is normally ~ 110's.    This is reviewed and independently interpreted by me, no pneumonia, renal tumor effusion.  Lactic is normal.  With 500 cc of fluid, patient's blood pressure significantly improved at 105 systolic.  On reevaluation, patient remains very sleepy and confused, however still responding appropriately.  He is pending urine and UDS.  Pt signed out to L Layden, PA-C for f/u on urine and reevaluation.   Final Clinical Impression(s) / ED Diagnoses Final diagnoses:  None    Rx / DC Orders ED Discharge Orders     None        Alveria Apley, PA-C 08/24/20 1502    Lorre Nick, MD 08/25/20 1501

## 2020-08-30 ENCOUNTER — Encounter (HOSPITAL_COMMUNITY): Payer: Self-pay

## 2020-08-30 ENCOUNTER — Emergency Department (HOSPITAL_COMMUNITY): Payer: Medicaid Other

## 2020-08-30 ENCOUNTER — Emergency Department (HOSPITAL_COMMUNITY)
Admission: EM | Admit: 2020-08-30 | Discharge: 2020-08-31 | Disposition: A | Discharge status: Home / Self Care | Payer: Medicaid Other | Attending: Emergency Medicine | Admitting: Emergency Medicine

## 2020-08-30 DIAGNOSIS — R404 Transient alteration of awareness: Secondary | ICD-10-CM | POA: Insufficient documentation

## 2020-08-30 DIAGNOSIS — R52 Pain, unspecified: Secondary | ICD-10-CM

## 2020-08-30 DIAGNOSIS — F17210 Nicotine dependence, cigarettes, uncomplicated: Secondary | ICD-10-CM | POA: Insufficient documentation

## 2020-08-30 DIAGNOSIS — Z20822 Contact with and (suspected) exposure to covid-19: Secondary | ICD-10-CM | POA: Insufficient documentation

## 2020-08-30 DIAGNOSIS — F1910 Other psychoactive substance abuse, uncomplicated: Secondary | ICD-10-CM

## 2020-08-30 MED ORDER — LORAZEPAM 2 MG/ML IJ SOLN: 2.0000 mg | Freq: Once | INTRAMUSCULAR | Status: AC

## 2020-08-30 MED ORDER — DIPHENHYDRAMINE HCL 50 MG/ML IJ SOLN: 25.0000 mg | Freq: Once | INTRAMUSCULAR | Status: DC

## 2020-08-30 MED ORDER — HALOPERIDOL LACTATE 5 MG/ML IJ SOLN: 5.0000 mg | Freq: Once | INTRAMUSCULAR | Status: DC

## 2020-08-30 MED ORDER — LORAZEPAM 2 MG/ML IJ SOLN
INTRAMUSCULAR | Status: AC
  Administered 2020-08-30: 2 mg via INTRAVENOUS
  Filled 2020-08-30: qty 1

## 2020-08-30 NOTE — ED Triage Notes (Signed)
Pt BIB [facility] for eval s/p being found wandering on the streets naked under some influence. Pt arrives shouting obscenities, cursing, incoherent speech. Reported he did LSD to EMS, hx of drug use
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
## 2020-08-31 ENCOUNTER — Emergency Department (HOSPITAL_COMMUNITY): Payer: Medicaid Other

## 2020-08-31 LAB — COMPREHENSIVE METABOLIC PANEL
ALT: 26 U/L (ref 0–44)
AST: 33 U/L (ref 15–41)
Albumin: 4 g/dL (ref 3.5–5.0)
Alkaline Phosphatase: 43 U/L (ref 38–126)
Anion gap: 6 (ref 5–15)
BUN: 12 mg/dL (ref 6–20)
CO2: 26 mmol/L (ref 22–32)
Calcium: 9.3 mg/dL (ref 8.9–10.3)
Chloride: 108 mmol/L (ref 98–111)
Creatinine, Ser: 1.16 mg/dL (ref 0.61–1.24)
GFR, Estimated: >60 mL/min (ref >60)
Glucose, Bld: 95 mg/dL (ref 70–99)
Potassium: 3.6 mmol/L (ref 3.5–5.1)
Sodium: 140 mmol/L (ref 135–145)
Total Bilirubin: 1.2 mg/dL (ref 0.3–1.2)
Total Protein: 6.8 g/dL (ref 6.5–8.1)

## 2020-08-31 LAB — CBC WITH DIFFERENTIAL/PLATELET
Abs Immature Granulocytes: 0.07 10*3/uL (ref 0.00–0.07)
Basophils Absolute: 0 10*3/uL (ref 0.0–0.1)
Basophils Relative: 0 %
Eosinophils Absolute: 0 10*3/uL (ref 0.0–0.5)
Eosinophils Relative: 0 %
HCT: 41.6 % (ref 39.0–52.0)
Hemoglobin: 13.9 g/dL (ref 13.0–17.0)
Immature Granulocytes: 0 %
Lymphocytes Relative: 8 %
Lymphs Abs: 1.3 10*3/uL (ref 0.7–4.0)
MCH: 31.6 pg (ref 26.0–34.0)
MCHC: 33.4 g/dL (ref 30.0–36.0)
MCV: 94.5 fL (ref 80.0–100.0)
Monocytes Absolute: 1.1 10*3/uL — ABNORMAL HIGH (ref 0.1–1.0)
Monocytes Relative: 7 %
Neutro Abs: 14 10*3/uL — ABNORMAL HIGH (ref 1.7–7.7)
Neutrophils Relative %: 85 %
Platelets: 241 10*3/uL (ref 150–400)
RBC: 4.4 MIL/uL (ref 4.22–5.81)
RDW: 12.9 % (ref 11.5–15.5)
WBC: 16.5 10*3/uL — ABNORMAL HIGH (ref 4.0–10.5)
nRBC: 0 % (ref 0.0–0.2)

## 2020-08-31 LAB — ETHANOL: Alcohol, Ethyl (B): <10 mg/dL (ref <10)

## 2020-08-31 LAB — ACETAMINOPHEN LEVEL: Acetaminophen (Tylenol), Serum: <10 ug/mL — ABNORMAL LOW (ref 10–30)

## 2020-08-31 LAB — CBG MONITORING, ED: Glucose-Capillary: 116 mg/dL — ABNORMAL HIGH (ref 70–99)

## 2020-08-31 LAB — RESP PANEL BY RT-PCR (FLU A&B, COVID) ARPGX2
Influenza A by PCR: NEGATIVE
Influenza B by PCR: NEGATIVE
SARS Coronavirus 2 by RT PCR: NEGATIVE

## 2020-08-31 LAB — CK: Total CK: 957 U/L — ABNORMAL HIGH (ref 49–397)

## 2020-08-31 LAB — SALICYLATE LEVEL: Salicylate Lvl: <7 mg/dL — ABNORMAL LOW (ref 7.0–30.0)

## 2020-08-31 MED ORDER — LACTATED RINGERS IV BOLUS
1000.0000 mL | Freq: Once | INTRAVENOUS | Status: AC
  Administered 2020-08-31: 1000 mL via INTRAVENOUS

## 2020-08-31 NOTE — ED Provider Notes (Signed)
Somnolent, anticiate d/C when awake.  Physical Exam  BP 112/63   Pulse 76   Temp 98.5 F (36.9 C) (Rectal)   Resp (!) 22   Ht 5\' 8"  (1.727 m)   Wt 60 kg   SpO2 98%   BMI 20.11 kg/m   Physical Exam  ED Course/Procedures     Procedures  MDM   08: 03 patient sitting up eating a meal bag.  He is watching television.  As I do recheck he wants me to get him some water.  Patient is alert, situationally aware.  Ready for discharge      , MD 08/31/20 [phone]

## 2020-08-31 NOTE — ED Notes (Signed)
Pt's hand abrasions cleaned. Pt ambulated w/o difficulty prior to shift change. Happy meal completed. IVF bolus completed. Denies questions or needs.

## 2020-08-31 NOTE — ED Notes (Signed)
Pt ambulated with assistance from this RN. Pt was a little unsteady , Dr. Bebe Shaggy made aware.

## 2020-08-31 NOTE — ED Notes (Signed)
Given juice, sandwich, cheese, graham crackers, and apple sauce. Sleeping, arousable to voice, alert, calm, interactive, slow thoughtful responses, appears dazed.

## 2020-09-02 ENCOUNTER — Emergency Department (HOSPITAL_COMMUNITY)
Admission: EM | Admit: 2020-09-02 | Discharge: 2020-09-02 | Disposition: A | Discharge status: Home / Self Care | Payer: Medicaid Other | Attending: Emergency Medicine | Admitting: Emergency Medicine

## 2020-09-02 ENCOUNTER — Encounter (HOSPITAL_COMMUNITY): Payer: Self-pay | Admitting: *Deleted

## 2020-09-02 DIAGNOSIS — M545 Low back pain, unspecified: Secondary | ICD-10-CM | POA: Insufficient documentation

## 2020-09-02 DIAGNOSIS — F17210 Nicotine dependence, cigarettes, uncomplicated: Secondary | ICD-10-CM | POA: Insufficient documentation

## 2020-09-02 DIAGNOSIS — Z5900 Homelessness unspecified: Secondary | ICD-10-CM | POA: Insufficient documentation

## 2020-09-02 DIAGNOSIS — R070 Pain in throat: Secondary | ICD-10-CM | POA: Insufficient documentation

## 2020-09-02 DIAGNOSIS — M79601 Pain in right arm: Secondary | ICD-10-CM | POA: Insufficient documentation

## 2020-09-02 DIAGNOSIS — M79602 Pain in left arm: Secondary | ICD-10-CM | POA: Insufficient documentation
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
## 2020-09-17 ENCOUNTER — Encounter (HOSPITAL_COMMUNITY): Payer: Self-pay

## 2020-09-17 ENCOUNTER — Emergency Department (HOSPITAL_COMMUNITY)
Admission: EM | Admit: 2020-09-17 | Discharge: 2020-09-17 | Disposition: A | Discharge status: Home / Self Care | Payer: Medicaid Other | Attending: Emergency Medicine | Admitting: Emergency Medicine

## 2020-09-17 DIAGNOSIS — E860 Dehydration: Secondary | ICD-10-CM | POA: Insufficient documentation

## 2020-09-17 DIAGNOSIS — Z5900 Homelessness unspecified: Secondary | ICD-10-CM | POA: Insufficient documentation

## 2020-09-17 DIAGNOSIS — F17210 Nicotine dependence, cigarettes, uncomplicated: Secondary | ICD-10-CM | POA: Insufficient documentation

## 2020-09-17 DIAGNOSIS — F1910 Other psychoactive substance abuse, uncomplicated: Secondary | ICD-10-CM | POA: Insufficient documentation
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
## 2020-09-17 NOTE — ED Notes (Signed)
Patient given two sandwich and a drink.
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
## 2020-10-02 ENCOUNTER — Emergency Department (HOSPITAL_COMMUNITY)
Admission: EM | Admit: 2020-10-02 | Discharge: 2020-10-02 | Disposition: A | Discharge status: Home / Self Care | Payer: Self-pay | Attending: Emergency Medicine | Admitting: Emergency Medicine

## 2020-10-02 ENCOUNTER — Emergency Department (HOSPITAL_COMMUNITY): Payer: Self-pay

## 2020-10-02 ENCOUNTER — Other Ambulatory Visit: Payer: Self-pay

## 2020-10-02 DIAGNOSIS — F17210 Nicotine dependence, cigarettes, uncomplicated: Secondary | ICD-10-CM | POA: Insufficient documentation

## 2020-10-02 DIAGNOSIS — M79672 Pain in left foot: Secondary | ICD-10-CM | POA: Insufficient documentation

## 2020-10-02 DIAGNOSIS — S6991XA Unspecified injury of right wrist, hand and finger(s), initial encounter: Secondary | ICD-10-CM | POA: Insufficient documentation

## 2020-10-02 DIAGNOSIS — W268XXA Contact with other sharp object(s), not elsewhere classified, initial encounter: Secondary | ICD-10-CM | POA: Insufficient documentation

## 2020-10-02 DIAGNOSIS — Y9222 Religious institution as the place of occurrence of the external cause: Secondary | ICD-10-CM | POA: Insufficient documentation

## 2020-10-02 DIAGNOSIS — Y9339 Activity, other involving climbing, rappelling and jumping off: Secondary | ICD-10-CM | POA: Insufficient documentation

## 2020-10-02 MED ORDER — IBUPROFEN 200 MG PO TABS
600.0000 mg | ORAL_TABLET | Freq: Once | ORAL | Status: AC
  Administered 2020-10-02: 600 mg via ORAL
  Filled 2020-10-02: qty 3

## 2020-10-02 NOTE — ED Provider Notes (Signed)
[facility]-EMERGENCY DEPT Provider Note   CSN: [id number] Arrival date & time: 10/02/20  0815     History Chief Complaint  Patient presents with   Finger Injury   Foot Pain    Bryan Payne is a 25 y.o. male with past medical history of polysubstance dependence and currently without housing.  Presents to the ED complaining of right middle finger and left foot pain.  States 2 days ago he was jumping over a metal fence and his finger got caught.  States he thinks the fence cut his left foot.  He denies any other injuries, hitting his head, or loss of consciousness.  Of note the patient is a poor historian and drowsy on exam stating that he has "dabbled in drugs" recently.  The history is provided by the patient.  Foot Pain This is a new problem. The current episode started 2 days ago. The problem has not changed since onset.Nothing aggravates the symptoms. Nothing relieves the symptoms. He has tried nothing for the symptoms.   Past Medical History:  Diagnosis Date   Homeless    Knee derangement 04/2013   left   Lateral meniscus tear 04/2013   left knee   Patient Active Problem List   Diagnosis Date Noted   Polysubstance (excluding opioids) dependence, daily use (HCC)    Adjustment disorder with disturbance of emotion 05/01/2017   Mushroom poisoning 12/22/2014   Acute renal failure (HCC) 12/22/2014   Cannabis abuse with cannabis-induced disorder (HCC) 12/22/2014   Dehydration 12/22/2014   Ingestion of unknown nonmedicinal substance 12/21/2014   Rectal bleeding 12/21/2014   Generalized abdominal pain 12/21/2014   Lateral meniscus tear 05/05/2013   Appendicitis, acute-gangrenous s/p laparoscopic appendectomy  10/13/2012   Past Surgical History:  Procedure Laterality Date   APPENDECTOMY     KNEE ARTHROSCOPY WITH LATERAL MENISECTOMY Left 05/05/2013   Procedure: LEFT KNEE ARTHROSCOPY WITH LATERAL MENISCAL REPAIR;  Surgeon: Eulas Post, MD;  Location:  [facility];  Service: Orthopedics;  Laterality: Left;   LAPAROSCOPIC APPENDECTOMY Right 10/13/2012   Procedure: APPENDECTOMY LAPAROSCOPIC;  Surgeon: Mariella Saa, MD;  Location: [facility];  Service: General;  Laterality: Right;    Family History  Problem Relation Age of Onset   Kidney failure Father        Renal transplant   Diabetes Other        Family on father's side    Social History   Tobacco Use   Smoking status: Every Day    Packs/day: 0.50    Types: Cigars, Cigarettes   Smokeless tobacco: Never  Vaping Use   Vaping Use: Never used  Substance Use Topics   Alcohol use: Yes    Comment: "often"   Drug use: Yes    Types: Marijuana, Methamphetamines    Comment: daily, hallucinagenics    Home Medications Prior to Admission medications   Medication Sig Start Date End Date Taking? Authorizing Provider  erythromycin ophthalmic ointment Place a 1/2 inch ribbon of ointment into the lower eyelid. Patient not taking: No sig reported 05/05/20   Graciella Freer A, PA-C  trimethoprim-polymyxin b (POLYTRIM) ophthalmic solution Place 1 drop into the right eye every 6 (six) hours. Patient not taking: No sig reported 05/08/20   Horton, Kristie M, DO    Allergies    Vicks vaporub [camph-eucalypt-men-turp-pet]  Review of Systems   Review of Systems  Musculoskeletal:  Positive for joint swelling.  Skin:  Positive for wound.  All other  systems reviewed and are negative.  Physical Exam Updated Vital Signs BP 111/74 (BP Location: Right Arm)   Pulse (!) 55   Temp 97.9 F (36.6 C) (Oral)   Resp 14   Ht 5\' 8"  (1.727 m)   Wt 59 kg   SpO2 98%   BMI 19.77 kg/m   Physical Exam Vitals and nursing note reviewed.  HENT:     Head: Normocephalic and atraumatic.  Eyes:     Extraocular Movements: Extraocular movements intact.     Conjunctiva/sclera: Conjunctivae normal.     Pupils: Pupils are equal, round, and reactive to light.  Cardiovascular:     Rate and Rhythm:  Normal rate and regular rhythm.     Pulses: Normal pulses.  Pulmonary:     Effort: Pulmonary effort is normal.  Abdominal:     General: Abdomen is flat.     Palpations: Abdomen is soft.  Musculoskeletal:        General: Swelling present.     Right hand: Swelling present. Decreased range of motion. Normal sensation.     Left hand: Normal.       Arms:     Cervical back: Normal range of motion and neck supple.     Right foot: Normal.     Left foot: Laceration present.       Legs:  Skin:    General: Skin is warm and dry.     Capillary Refill: Capillary refill takes less than 2 seconds.  Neurological:     General: No focal deficit present.     Mental Status: He is alert.     Sensory: No sensory deficit.    ED Results / Procedures / Treatments   Labs (all labs ordered are listed, but only abnormal results are displayed) Labs Reviewed - No data to display  EKG None  Radiology DG Finger Middle Right  Result Date: 10/02/2020 CLINICAL DATA:  25 year old male with laceration, swelling and pain. EXAM: RIGHT MIDDLE FINGER 2+V COMPARISON:  Right hand series 04/05/2020. FINDINGS: Bone mineralization is within normal limits. There is no evidence of fracture or dislocation. There is no evidence of arthropathy or other focal bone abnormality. Soft tissue swelling maximal at the 3rd PIP. No soft tissue gas. No radiopaque foreign body identified. IMPRESSION: Soft tissue swelling with no osseous abnormality identified in the right 3rd finger. Electronically Signed   By: 04/07/2020 M.D.   On: 10/02/2020 09:36   DG Foot Complete Left  Result Date: 10/02/2020 CLINICAL DATA:  Foot pain, laceration and arch of foot EXAM: LEFT FOOT - COMPLETE 3+ VIEW COMPARISON:  09/25/2019 FINDINGS: Osseous mineralization normal. Joint spaces preserved. No fracture, dislocation, or bone destruction. No radiopaque foreign bodies or soft tissue gas identified. IMPRESSION: Normal exam. Electronically Signed   By: 09/27/2019 M.D.   On: 10/02/2020 09:41    Procedures Procedures   Medications Ordered in ED Medications  ibuprofen (ADVIL) tablet 600 mg (has no administration in time range)    ED Course  I have reviewed the triage vital signs and the nursing notes.  Pertinent labs & imaging results that were available during my care of the patient were reviewed by me and considered in my medical decision making (see chart for details).  MDM Rules/Calculators/A&P Clerance Umland is a 25 year old male presents to the emergency department for right third finger PIP joint swelling and tenderness.  As well as a left foot pain.   Right hand without neurological/sensory deficit or musculoskeletal deficit  in range of motion.  There is only pain at PIP joint with palpation.  There is no break in the skin barrier, surrounding redness, heat, induration, swelling of MIP or wrist, or fever to suspect further infection.  Radiologic imaging showed soft tissue swelling without soft tissue gas or bony abnormality.  The left foot has only superficial skin scratches. Imaging of the left foot negative.  At this time patient is safe for discharge.  Given a 600mg  of ibuprofen here to aid in pain control.  Patient has minimal pain.  Tetanus given in July 2019, so not updated here today. May follow-up at community clinic or emergency department if there are any signs of continued swelling, redness, heat, discharge from the finger or fever.  Final Clinical Impression(s) / ED Diagnoses Final diagnoses:  Injury of finger of right hand, initial encounter  Foot pain, left    Rx / DC Orders ED Discharge Orders     None        05-08-1973, PA-C 10/02/20 1000    10/04/20, MD 10/02/20 1750

## 2020-10-02 NOTE — Discharge Instructions (Addendum)
You were seen in the emergency department today for pain in your right middle finger and your left foot after falling over a fence.  The imaging of your finger and your foot revealed that there are no breaks/fractures in either your hand or foot.  These feel free to follow-up at the community health and wellness clinic.  Please return to the emergency department if you have further swelling of the finger discharge from the finger, increased redness or heat, fever.

## 2020-10-02 NOTE — ED Triage Notes (Signed)
Pt arrives via [facility] for evaluation of finger injury. EMS was called by [employer] for patient [place] of a church c/o R middle finger pain. PMS intact with EMS   Last VS - 120/70, HR 62, RR 16, 98% on RA.

## 2020-10-08 ENCOUNTER — Emergency Department (HOSPITAL_COMMUNITY)
Admission: EM | Admit: 2020-10-08 | Discharge: 2020-10-09 | Disposition: A | Discharge status: Home / Self Care | Payer: Medicaid Other | Attending: Student | Admitting: Student

## 2020-10-08 ENCOUNTER — Encounter (HOSPITAL_COMMUNITY): Payer: Self-pay

## 2020-10-08 DIAGNOSIS — S90821A Blister (nonthermal), right foot, initial encounter: Secondary | ICD-10-CM | POA: Insufficient documentation

## 2020-10-08 DIAGNOSIS — M79671 Pain in right foot: Secondary | ICD-10-CM

## 2020-10-08 DIAGNOSIS — F17210 Nicotine dependence, cigarettes, uncomplicated: Secondary | ICD-10-CM | POA: Insufficient documentation

## 2020-10-08 DIAGNOSIS — W269XXA Contact with unspecified sharp object(s), initial encounter: Secondary | ICD-10-CM | POA: Insufficient documentation

## 2020-10-08 DIAGNOSIS — T148XXA Other injury of unspecified body region, initial encounter: Secondary | ICD-10-CM

## 2020-10-08 DIAGNOSIS — S90822A Blister (nonthermal), left foot, initial encounter: Secondary | ICD-10-CM | POA: Insufficient documentation

## 2020-10-08 NOTE — ED Notes (Signed)
Pt called multiple times for room placement. Pt sts he has been asleep in waiting room. Wakes up screaming at patients.

## 2020-10-08 NOTE — ED Provider Notes (Signed)
[facility]-EMERGENC[facility] DEPT Provider Note   CSN: [id number] Arrival date & time: 10/08/20  1747     History Chief Complaint  Patient presents with   Foot Pain    Bryan Payne is a 25 y.o. male.  Presents bilateral foot pain.  Patient states that he feels like his "foot has been bleeding."  He has a cut to the backside of his left heel.  He denies any known injuries.  Patient is homeless and does a lot of walking.  He is currently wearing sandals.  He states that the bottom of his feet hurt all the time.  He denies any known injuries   Foot Pain      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/01/2017   Mushroom poisoning 12/22/2014   Acute renal failure (HCC) 12/22/2014   Cannabis abuse with cannabis-induced disorder (HCC) 12/22/2014   Dehydration 12/22/2014   Ingestion of unknown nonmedicinal substance 12/21/2014   Rectal bleeding 12/21/2014   Generalized abdominal pain 12/21/2014   Lateral meniscus tear 05/05/2013   Appendicitis, acute-gangrenous s/p laparoscopic appendectomy  10/13/2012    Past Surgical History:  Procedure Laterality Date   APPENDECTOMY     KNEE ARTHROSCOPY WITH LATERAL MENISECTOMY Left 05/05/2013   Procedure: LEFT KNEE ARTHROSCOPY WITH LATERAL MENISCAL REPAIR;  Surgeon: Eulas Post, MD;  [facility];  Service: Orthopedics;  Laterality: Left;   LAPAROSCOPIC APPENDECTOMY Right 10/13/2012   Procedure: APPENDECTOMY LAPAROSCOPIC;  Surgeon: Mariella Saa, MD;  Location: [facility];  Service: General;  Laterality: Right;       Family History  Problem Relation Age of Onset   Kidney failure Father        Renal transplant   Diabetes Other         Family on father's side    Social History   Tobacco Use   Smoking status: Every Day    Packs/day: 0.50    Types: Cigars, Cigarettes   Smokeless tobacco: Never  Vaping Use   Vaping Use: Never used  Substance Use Topics   Alcohol use: Yes    Comment: "often"   Drug use: Yes    Types: Marijuana, Methamphetamines    Comment: daily, hallucinagenics    Home Medications Prior to Admission medications   Medication Sig Start Date End Date Taking? Authorizing Provider  erythromycin ophthalmic ointment Place a 1/2 inch ribbon of ointment into the lower eyelid. Patient not taking: No sig reported 05/05/20   Graciella Freer A, PA-C  trimethoprim-polymyxin b (POLYTRIM) ophthalmic solution Place 1 drop into the right eye every 6 (six) hours. Patient not taking: No sig reported 05/08/20   Horton, Kristie M, DO    Allergies    Vicks vaporub [camph-eucalypt-men-turp-pet]  Review of Systems   Review of Systems  Musculoskeletal:  Positive for gait problem.  Skin:  Positive for wound.  Neurological:  Negative for weakness and numbness.   Physical Exam Updated Vital Signs BP 134/62   Pulse 76   Temp 99 F (37.2 C) (Oral)   Resp 18   Ht 5\' 8"  (1.727 m)   Wt 58 kg   SpO2 98%   BMI 19.44 kg/m  Physical Exam Vitals and nursing note reviewed.  Constitutional:      General: He is not in acute distress.    Appearance: He is well-developed. He is not diaphoretic.  HENT:     Head: Normocephalic and atraumatic.  Eyes:     General: No scleral icterus.    Conjunctiva/sclera: Conjunctivae normal.  Cardiovascular:     Rate and Rhythm: Normal rate and regular rhythm.     Heart sounds: Normal heart sounds.  Pulmonary:     Effort: Pulmonary effort is normal. No respiratory distress.     Breath sounds: Normal breath sounds.  Abdominal:     Palpations: Abdomen is soft.     Tenderness: There is no abdominal tenderness.  Musculoskeletal:     Cervical back: Normal range of motion and neck  supple.     Comments: No obvious injuries to the plantar surface of the foot.  There is a small blister to the left heel.  No active bleeding.  Skin:    General: Skin is warm and dry.  Neurological:     Mental Status: He is alert.  Psychiatric:        Behavior: Behavior normal.    ED Results / Procedures / Treatments   Labs (all labs ordered are listed, but only abnormal results are displayed) Labs Reviewed - No data to display  EKG None  Radiology No results found.  Procedures Procedures   Medications Ordered in ED Medications - No data to display  ED Course  I have reviewed the triage vital signs and the nursing notes.  Pertinent labs & imaging results that were available during my care of the patient were reviewed by me and considered in my medical decision making (see chart for details).    MDM Rules/Calculators/A&P                           Patient with a blister to the left heel no signs of infection.  I suspect his feet are tender from walking.  He does not appear to have any evidence of plantar fasciitis.  Patient appears otherwise appropriate for discharge at this time Final Clinical Impression(s) / ED Diagnoses Final diagnoses:  Foot pain, bilateral  Blister    Rx / DC Orders ED Discharge Orders     None        Arthor Captain, PA-C 10/09/20 0030    Glendora Score, MD 10/09/20 [phone]

## 2020-10-08 NOTE — ED Notes (Signed)
Pt called 3x for room placement. Eloped from waiting area.  

## 2020-10-08 NOTE — ED Triage Notes (Signed)
[place] [facility] [place] with c/o of bilateral foot pain. Pt denies ETOH or drug use.

## 2020-10-09 ENCOUNTER — Other Ambulatory Visit: Payer: Self-pay

## 2020-10-09 NOTE — Discharge Instructions (Addendum)
Get help right away if: °Your foot is numb or tingling. °Your foot or toes are swollen. °Your foot or toes turn white or blue. °You have warmth and redness along your foot. °

## 2021-05-23 ENCOUNTER — Ambulatory Visit (INDEPENDENT_AMBULATORY_CARE_PROVIDER_SITE_OTHER): Payer: Self-pay | Admitting: Family Medicine

## 2021-05-23 ENCOUNTER — Encounter: Payer: Self-pay | Admitting: Family Medicine

## 2021-05-23 VITALS — BP 119/74 | HR 71 | Temp 98.3°F | Resp 18 | Ht 67.32 in | Wt 165.0 lb

## 2021-05-23 DIAGNOSIS — Z7689 Persons encountering health services in other specified circumstances: Secondary | ICD-10-CM

## 2021-05-23 DIAGNOSIS — F209 Schizophrenia, unspecified: Secondary | ICD-10-CM

## 2021-05-23 MED ORDER — TRAZODONE HCL 50 MG PO TABS: 50.0000 mg | ORAL_TABLET | Freq: Every day | ORAL | 3 refills | Status: DC

## 2021-05-23 MED ORDER — RISPERIDONE 0.5 MG PO TABS: 0.5000 mg | ORAL_TABLET | Freq: Every day | ORAL | 3 refills | Status: DC

## 2021-05-23 NOTE — Progress Notes (Signed)
Pt presents to establish care accompanied by mother Tamela Oddi states he was just released from [facility]  ?Needs referral to Psychiatry  ?Needs refill on Trazodone 50mg  ?Request refill on Risperdal 0.5 mg until appt w/psych ?

## 2021-05-26 ENCOUNTER — Encounter: Payer: Self-pay | Admitting: Family Medicine

## 2021-05-26 NOTE — Progress Notes (Signed)
? ?New Patient Office Visit ? ?Subjective:  ?Patient ID: Bryan Payne, male    DOB: 05/17/1995  Age: 26 y.o. MRN: [id number] ? ?CC:  ?Chief Complaint  ?Patient presents with  ? Establish Care  ? Follow-up  ?  ED  ? ? ?HPI ?Bryan Payne presents for to establish care. Patient was recently released from [facility] with dx of schizophrenia. He desires referral to [facility] but needs some refills of his meds until he is seen.  ? ?Past Medical History:  ?Diagnosis Date  ? Homeless   ? Knee derangement 04/2013  ? left  ? Lateral meniscus tear 04/2013  ? left knee  ? ? ?Past Surgical History:  ?Procedure Laterality Date  ? APPENDECTOMY    ? KNEE ARTHROSCOPY WITH LATERAL MENISECTOMY Left 05/05/2013  ? Procedure: LEFT KNEE ARTHROSCOPY WITH LATERAL MENISCAL REPAIR;  Surgeon: Johnny Bridge, MD;  Location: [facility];  Service: Orthopedics;  Laterality: Left;  ? LAPAROSCOPIC APPENDECTOMY Right 10/13/2012  ? Procedure: APPENDECTOMY LAPAROSCOPIC;  Surgeon: Edward Jolly, MD;  Location: [facility];  Service: General;  Laterality: Right;  ? ? ?Family History  ?Problem Relation Age of Onset  ? Kidney failure Father   ?     Renal transplant  ? Diabetes Other   ?     Family on father's side  ? ? ?Social History  ? ?Socioeconomic History  ? Marital status: Single  ?  Spouse name: Not on file  ? Number of children: Not on file  ? Years of education: Not on file  ? Highest education level: Not on file  ?Occupational History  ? Not on file  ?Tobacco Use  ? Smoking status: Every Day  ?  Packs/day: 0.50  ?  Types: Cigars, Cigarettes  ?  Passive exposure: Current  ? Smokeless tobacco: Never  ?Vaping Use  ? Vaping Use: Never used  ?Substance and Sexual Activity  ? Alcohol use: Yes  ?  Comment: "often"  ? Drug use: Yes  ?  Types: Marijuana, Methamphetamines  ?  Comment: daily, hallucinagenics  ? Sexual activity: Not Currently  ?Other Topics Concern  ? Not on file  ?Social History Narrative  ? Not on file  ? ?Social Determinants of  Health  ? ?Financial Resource Strain: Not on file  ?Food Insecurity: Not on file  ?Transportation Needs: Not on file  ?Physical Activity: Not on file  ?Stress: Not on file  ?Social Connections: Not on file  ?Intimate Partner Violence: Not on file  ? ? ?ROS ?Review of Systems  ?Psychiatric/Behavioral:  Positive for agitation, hallucinations and sleep disturbance. Negative for self-injury and suicidal ideas. The patient is nervous/anxious.   ? ?Objective:  ? ?Today's Vitals: BP 119/74 (BP Location: Left Arm, Patient Position: Sitting, Cuff Size: Large)   Pulse 71   Temp 98.3 ?F (36.8 ?C)   Resp 18   Ht 5' 7.32" (1.71 m)   Wt 165 lb (74.8 kg)   SpO2 97%   BMI 25.60 kg/m?  ? ?Physical Exam ?Vitals and nursing note reviewed.  ?Constitutional:   ?   General: He is not in acute distress. ?Cardiovascular:  ?   Rate and Rhythm: Normal rate and regular rhythm.  ?Pulmonary:  ?   Effort: Pulmonary effort is normal.  ?   Breath sounds: Normal breath sounds.  ?Neurological:  ?   General: No focal deficit present.  ?   Mental Status: He is alert and oriented to person, place, and  time.  ?Psychiatric:     ?   Mood and Affect: Mood is anxious. Affect is labile.     ?   Speech: Speech normal.     ?   Behavior: Behavior is agitated. Behavior is cooperative.     ?   Thought Content: Thought content is paranoid.     ?   Judgment: Judgment is impulsive.  ? ? ?Assessment & Plan:  ? ?1. Schizophrenia, unspecified type ([place]) ?Referral to [facility] for further eval/mgt. Risperdal and trazodone refilled.  ? ?2. Encounter to establish care ? ? ? ? ?Outpatient Encounter Medications as of 05/23/2021  ?Medication Sig  ? cetirizine (ZYRTEC) 5 MG tablet Take 5 mg by mouth daily.  ? Cholecalciferol 50 MCG (2000 UT) TABS Take 1 tablet by mouth daily.  ? famotidine (PEPCID) 20 MG tablet Take 20 mg by mouth at bedtime.  ? fluticasone (FLONASE) 50 MCG/ACT nasal spray Place into both nostrils daily.  ? [DISCONTINUED] risperiDONE (RISPERDAL) 0.5 MG tablet  Take 0.5 mg by mouth at bedtime.  ? [DISCONTINUED] traZODone (DESYREL) 50 MG tablet Take 50 mg by mouth at bedtime.  ? risperiDONE (RISPERDAL) 0.5 MG tablet Take 1 tablet (0.5 mg total) by mouth at bedtime.  ? traZODone (DESYREL) 50 MG tablet Take 1 tablet (50 mg total) by mouth at bedtime.  ? [DISCONTINUED] erythromycin ophthalmic ointment Place a 1/2 inch ribbon of ointment into the lower eyelid. (Patient not taking: No sig reported)  ? [DISCONTINUED] trimethoprim-polymyxin b (POLYTRIM) ophthalmic solution Place 1 drop into the right eye every 6 (six) hours. (Patient not taking: No sig reported)  ? ?No facility-administered encounter medications on file as of 05/23/2021.  ? ? ?Follow-up: No follow-ups on file.  ? ?Becky Sax, MD ? ?

## 2021-07-23 ENCOUNTER — Telehealth: Payer: Self-pay | Admitting: *Deleted

## 2021-07-23 NOTE — Telephone Encounter (Signed)
Fax sent to [facility] and [employer]  Verification of appt on 05/23/2021

## 2022-03-09 ENCOUNTER — Other Ambulatory Visit (HOSPITAL_COMMUNITY)
Admission: EM | Admit: 2022-03-09 | Discharge: 2022-03-12 | Disposition: A | Discharge status: Home / Self Care | Payer: Medicaid Other | Attending: Psychiatry | Admitting: Psychiatry

## 2022-03-09 ENCOUNTER — Encounter (HOSPITAL_COMMUNITY): Payer: Self-pay

## 2022-03-09 DIAGNOSIS — F1410 Cocaine abuse, uncomplicated: Secondary | ICD-10-CM

## 2022-03-09 DIAGNOSIS — Z1152 Encounter for screening for COVID-19: Secondary | ICD-10-CM | POA: Insufficient documentation

## 2022-03-09 DIAGNOSIS — F32A Depression, unspecified: Secondary | ICD-10-CM | POA: Insufficient documentation

## 2022-03-09 DIAGNOSIS — F1494 Cocaine use, unspecified with cocaine-induced mood disorder: Secondary | ICD-10-CM | POA: Diagnosis present

## 2022-03-09 DIAGNOSIS — F1414 Cocaine abuse with cocaine-induced mood disorder: Secondary | ICD-10-CM

## 2022-03-09 DIAGNOSIS — F17210 Nicotine dependence, cigarettes, uncomplicated: Secondary | ICD-10-CM | POA: Insufficient documentation

## 2022-03-09 DIAGNOSIS — F209 Schizophrenia, unspecified: Secondary | ICD-10-CM | POA: Insufficient documentation

## 2022-03-09 DIAGNOSIS — Z9151 Personal history of suicidal behavior: Secondary | ICD-10-CM | POA: Insufficient documentation

## 2022-03-09 DIAGNOSIS — Z79899 Other long term (current) drug therapy: Secondary | ICD-10-CM | POA: Insufficient documentation

## 2022-03-09 DIAGNOSIS — Z5900 Homelessness unspecified: Secondary | ICD-10-CM | POA: Insufficient documentation

## 2022-03-09 LAB — CBC WITH DIFFERENTIAL/PLATELET
Abs Immature Granulocytes: 0.03 10*3/uL (ref 0.00–0.07)
Basophils Absolute: 0.1 10*3/uL (ref 0.0–0.1)
Basophils Relative: 1 %
Eosinophils Absolute: 0.2 10*3/uL (ref 0.0–0.5)
Eosinophils Relative: 2 %
HCT: 48.7 % (ref 39.0–52.0)
Hemoglobin: 16.1 g/dL (ref 13.0–17.0)
Immature Granulocytes: 0 %
Lymphocytes Relative: 33 %
Lymphs Abs: 3 10*3/uL (ref 0.7–4.0)
MCH: 30.5 pg (ref 26.0–34.0)
MCHC: 33.1 g/dL (ref 30.0–36.0)
MCV: 92.2 fL (ref 80.0–100.0)
Monocytes Absolute: 0.5 10*3/uL (ref 0.1–1.0)
Monocytes Relative: 6 %
Neutro Abs: 5.2 10*3/uL (ref 1.7–7.7)
Neutrophils Relative %: 58 %
Platelets: 283 10*3/uL (ref 150–400)
RBC: 5.28 MIL/uL (ref 4.22–5.81)
RDW: 12.5 % (ref 11.5–15.5)
WBC: 9 10*3/uL (ref 4.0–10.5)
nRBC: 0 % (ref 0.0–0.2)

## 2022-03-09 LAB — POCT URINE DRUG SCREEN - MANUAL ENTRY (I-SCREEN)
POC Amphetamine UR: NOT DETECTED
POC Buprenorphine (BUP): NOT DETECTED
POC Cocaine UR: POSITIVE — AB
POC Marijuana UR: POSITIVE — AB
POC Methadone UR: NOT DETECTED
POC Methamphetamine UR: NOT DETECTED
POC Morphine: NOT DETECTED
POC Oxazepam (BZO): NOT DETECTED
POC Oxycodone UR: NOT DETECTED
POC Secobarbital (BAR): NOT DETECTED

## 2022-03-09 LAB — COMPREHENSIVE METABOLIC PANEL
ALT: 21 U/L (ref 0–44)
AST: 17 U/L (ref 15–41)
Albumin: 3.6 g/dL (ref 3.5–5.0)
Alkaline Phosphatase: 30 U/L — ABNORMAL LOW (ref 38–126)
Anion gap: 8 (ref 5–15)
BUN: 9 mg/dL (ref 6–20)
CO2: 30 mmol/L (ref 22–32)
Calcium: 9.1 mg/dL (ref 8.9–10.3)
Chloride: 102 mmol/L (ref 98–111)
Creatinine, Ser: 1.03 mg/dL (ref 0.61–1.24)
GFR, Estimated: >60 mL/min (ref >60)
Glucose, Bld: 97 mg/dL (ref 70–99)
Potassium: 4 mmol/L (ref 3.5–5.1)
Sodium: 140 mmol/L (ref 135–145)
Total Bilirubin: 1.1 mg/dL (ref 0.3–1.2)
Total Protein: 5.6 g/dL — ABNORMAL LOW (ref 6.5–8.1)

## 2022-03-09 LAB — RESP PANEL BY RT-PCR (RSV, FLU A&B, COVID)  RVPGX2
Influenza A by PCR: NEGATIVE
Influenza B by PCR: NEGATIVE
Resp Syncytial Virus by PCR: NEGATIVE
SARS Coronavirus 2 by RT PCR: NEGATIVE

## 2022-03-09 LAB — POC SARS CORONAVIRUS 2 AG: SARSCOV2ONAVIRUS 2 AG: NEGATIVE

## 2022-03-09 LAB — LIPID PANEL
Cholesterol: 179 mg/dL (ref 0–200)
HDL: 44 mg/dL (ref >40)
LDL Cholesterol: 119 mg/dL — ABNORMAL HIGH (ref 0–99)
Total CHOL/HDL Ratio: 4.1 RATIO
Triglycerides: 80 mg/dL (ref <150)
VLDL: 16 mg/dL (ref 0–40)

## 2022-03-09 LAB — HIV ANTIBODY (ROUTINE TESTING W REFLEX): HIV Screen 4th Generation wRfx: NONREACTIVE

## 2022-03-09 LAB — MAGNESIUM: Magnesium: 2.4 mg/dL (ref 1.7–2.4)

## 2022-03-09 LAB — TSH: TSH: 1.88 u[IU]/mL (ref 0.350–4.500)

## 2022-03-09 LAB — RPR: RPR Ser Ql: NONREACTIVE

## 2022-03-09 LAB — ETHANOL: Alcohol, Ethyl (B): <10 mg/dL (ref <10)

## 2022-03-09 LAB — HEMOGLOBIN A1C
Hgb A1c MFr Bld: 5.5 % (ref 4.8–5.6)
Mean Plasma Glucose: 111.15 mg/dL

## 2022-03-09 MED ORDER — SERTRALINE HCL 50 MG PO TABS
50.0000 mg | ORAL_TABLET | Freq: Every day | ORAL | Status: DC
  Administered 2022-03-09 – 2022-03-12 (×4): 50 mg via ORAL
  Filled 2022-03-09 (×2): qty 1
  Filled 2022-03-09: qty 14
  Filled 2022-03-09 (×2): qty 1

## 2022-03-09 MED ORDER — HYDROXYZINE HCL 25 MG PO TABS
25.0000 mg | ORAL_TABLET | Freq: Three times a day (TID) | ORAL | Status: DC | PRN
  Administered 2022-03-12: 25 mg via ORAL
  Filled 2022-03-09: qty 1
  Filled 2022-03-09: qty 10

## 2022-03-09 MED ORDER — TRAZODONE HCL 50 MG PO TABS
50.0000 mg | ORAL_TABLET | Freq: Every evening | ORAL | Status: DC | PRN
  Administered 2022-03-09 – 2022-03-11 (×3): 50 mg via ORAL
  Filled 2022-03-09 (×3): qty 1
  Filled 2022-03-09: qty 14

## 2022-03-09 MED ORDER — MAGNESIUM HYDROXIDE 400 MG/5ML PO SUSP: 30.0000 mL | Freq: Every day | ORAL | Status: DC | PRN

## 2022-03-09 MED ORDER — RISPERIDONE 1 MG PO TABS
1.0000 mg | ORAL_TABLET | Freq: Every day | ORAL | Status: DC
  Administered 2022-03-09 – 2022-03-12 (×4): 1 mg via ORAL
  Filled 2022-03-09 (×4): qty 1
  Filled 2022-03-09: qty 14

## 2022-03-09 MED ORDER — ALUM & MAG HYDROXIDE-SIMETH 200-200-20 MG/5ML PO SUSP
30.0000 mL | ORAL | Status: DC | PRN
  Administered 2022-03-12: 30 mL via ORAL
  Filled 2022-03-09: qty 30

## 2022-03-09 MED ORDER — ACETAMINOPHEN 325 MG PO TABS
650.0000 mg | ORAL_TABLET | Freq: Four times a day (QID) | ORAL | Status: DC | PRN
  Administered 2022-03-11: 650 mg via ORAL
  Filled 2022-03-09: qty 2

## 2022-03-09 NOTE — ED Notes (Signed)
Patient resting quietly in bed with eyes closed. Respirations equal and unlabored, skin warm and dry, NAD. Routine safety checks conducted according to facility protocol. Will continue to monitor for safety.  

## 2022-03-09 NOTE — ED Notes (Signed)
Pt is in the bed sleeping. Respirations are even and unlabored. No acute distress noted. Will continue to monitor for safety. 

## 2022-03-09 NOTE — Progress Notes (Signed)
Received Bryan Payne in the OBS area after the admission p[rocess. He was oriented to his new environment and offered nourishments. He denied feeling suicidal at the present time.

## 2022-03-09 NOTE — ED Triage Notes (Signed)
Pt presents to [facility] voluntarily. Pt reports struggling with addiction to crack. Pt reports conflict with family due to substance use. Pt reports recent SI as recent as earlier this month. Pt reports recently being discharged from inpatient. Pt denies HI and AVH at this time. Pt is urgent.

## 2022-03-09 NOTE — Tx Team (Signed)
LCSW met with patient to assess current mood, affect, physical state, and inquire about needs/goals while here in [facility] and after discharge. Patient reports he presented due to needing help with his substance use. Patient reports he has used a half gram of crack cocaine daily for the past year. Patient reports he is able to afford the cocaine by donating plasma. Patient reports he wants to work on recovery and figure out a better plan for himself. Patient reports he has been living back and forth with his mother, then reports "it's complicated". Patient denies having access to transportation, and reports having limited social support. Patient reports he is currently unemployed. Patient also denies any legal charges or pending court dates. Patient reports his current goal is to seek residential placement for substance use. Patient reports he has been to [facility] back in 2022 for "inability to proceed with court case". Patient reports he remained at [facility] for a few months in order to be evaluated. Patient reports he has been diagnosed in the past with Schizophrenia. Patient reports he has an ACT team and peer support specialist from [facility]. Patient would like to seek further treatment at this time. Patient aware that LCSW will send referrals out for review and will follow up to provide updates as received. Patient expressed understanding and appreciation of LCSW assistance. No other needs were reported at this time by patient.   Referral will be sent to [place] for review.  LCSW will continue to follow and provide support to patient while on [facility] unit.   Lucius Conn, LCSW Clinical Social Worker [facility] Ph: [phone]
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
## 2022-03-09 NOTE — BH IP Treatment Plan (Signed)
Interdisciplinary Treatment and Diagnostic Plan Update  03/09/2022 Time of Session: 3:30PM CRAWFORD TAMURA MRN: [id number]  Diagnosis:  Final diagnoses:  Cocaine abuse ([place])  Cocaine abuse with cocaine-induced mood disorder (HCC)     Current Medications:  Current Facility-Administered Medications  Medication Dose Route Frequency Provider Last Rate Last Admin   acetaminophen (TYLENOL) tablet 650 mg  650 mg Oral Q6H PRN Revonda Humphrey, NP       alum & mag hydroxide-simeth (MAALOX/MYLANTA) 200-200-20 MG/5ML suspension 30 mL  30 mL Oral Q4H PRN Revonda Humphrey, NP       hydrOXYzine (ATARAX) tablet 25 mg  25 mg Oral TID PRN Revonda Humphrey, NP       magnesium hydroxide (MILK OF MAGNESIA) suspension 30 mL  30 mL Oral Daily PRN Revonda Humphrey, NP       risperiDONE (RISPERDAL) tablet 1 mg  1 mg Oral Daily Thomes Lolling H, NP   1 mg at 03/09/22 0856   sertraline (ZOLOFT) tablet 50 mg  50 mg Oral Daily Revonda Humphrey, NP   50 mg at 03/09/22 0856   traZODone (DESYREL) tablet 50 mg  50 mg Oral QHS PRN Revonda Humphrey, NP       Current Outpatient Medications  Medication Sig Dispense Refill   risperiDONE (RISPERDAL) 0.5 MG tablet Take 1 tablet (0.5 mg total) by mouth at bedtime. (Patient taking differently: Take 1 mg by mouth daily.) 30 tablet 3   sertraline (ZOLOFT) 50 MG tablet Take 50 mg by mouth daily.     PTA Medications: Prior to Admission medications   Medication Sig Start Date End Date Taking? Authorizing Provider  risperiDONE (RISPERDAL) 0.5 MG tablet Take 1 tablet (0.5 mg total) by mouth at bedtime. Patient taking differently: Take 1 mg by mouth daily. 05/23/21  Yes Dorna Mai, MD  sertraline (ZOLOFT) 50 MG tablet Take 50 mg by mouth daily. 01/21/22  Yes [provider]    Patient Stressors: Financial difficulties   Substance abuse   Other: homelessness; transferring between family members    Patient Strengths: Capable of independent living   General fund of knowledge  Motivation for treatment/growth   Treatment Modalities: Medication Management, Group therapy, Case management,  1 to 1 session with clinician, Psychoeducation, Recreational therapy.   Physician Treatment Plan for Primary and Secondary Diagnosis:  Final diagnoses:  Cocaine abuse ([place])  Cocaine abuse with cocaine-induced mood disorder ([place])   Long Term Goal(s): Improvement in symptoms so as ready for discharge  Short Term Goals: Patient will verbalize feelings in meetings with treatment team members. Patient will attend at least of 50% of the groups daily. Pt will complete the PHQ9 on admission, day 3 and discharge. Patient will participate in completing the [place] Patient will score a low risk of violence for 24 hours prior to discharge Patient will take medications as prescribed daily.  Medication Management: Evaluate patient's response, side effects, and tolerance of medication regimen.  Therapeutic Interventions: 1 to 1 sessions, Unit Group sessions and Medication administration.  Evaluation of Outcomes: Progressing  LCSW Treatment Plan for Primary Diagnosis:  Final diagnoses:  Cocaine abuse ([place])  Cocaine abuse with cocaine-induced mood disorder ([place])    Long Term Goal(s): Safe transition to appropriate next level of care at discharge.  Short Term Goals: Facilitate acceptance of mental health diagnosis and concerns through verbal commitment to aftercare plan and appointments at discharge., Patient will identify one social support prior to discharge to  aid in patient's recovery., Patient will attend AA/NA groups as scheduled., Identify minimum of 2 triggers associated with mental health/substance abuse issues with treatment team members., and Increase skills for wellness and recovery by attending 50% of scheduled groups.  Therapeutic Interventions: Assess for all discharge needs, 1 to 1 time with Education officer, museum,  Explore available resources and support systems, Assess for adequacy in community support network, Educate family and significant other(s) on suicide prevention, Complete Psychosocial Assessment, Interpersonal group therapy.  Evaluation of Outcomes: Progressing   Progress in Treatment: Attending groups: Yes. Participating in groups: Yes. Taking medication as prescribed: Yes. Toleration medication: Yes. Family/Significant other contact made: No, will contact:  patient declined collateral.  Patient understands diagnosis: Yes. Discussing patient identified problems/goals with staff: Yes. Medical problems stabilized or resolved: Yes. Denies suicidal/homicidal ideation: Yes. Issues/concerns per patient self-inventory: Yes. Other: substance use and need for treatment  New problem(s) identified: No, Describe:  other than reported on admission.   New Short Term/Long Term Goal(s): Safe transition to appropriate next level of care at discharge, Engage patient in therapeutic group addressing interpersonal concerns. Engage patient in aftercare planning with referrals and resources, Increase ability to appropriately verbalize feelings, Facilitate acceptance of mental health diagnosis and concerns and Identify triggers associated with mental health/substance abuse issues.   Patient Goals:  Patient is seeking residential placement at this time for substance use.    Discharge Plan or Barriers: LCSW will send referrals out for review for residential placement. Updates will be provided as received.   Reason for Continuation of Hospitalization: Withdrawal symptoms  Estimated Length of Stay: 3-5 days  Last 3 [place] Suicide Severity Risk Score: [place] ED from 03/09/2022 in [facility] ED from 10/08/2020 in [facility] Emergency Department at [facility] ED from 10/02/2020 in [facility] Emergency Department at [place] No Risk  No Risk No Risk       Last [facility] 2/9 Scores:    03/09/2022    8:25 AM 05/23/2021    9:06 AM 03/31/2020    8:26 AM  Depression screen PHQ 2/9  Decreased Interest 1 1 1   Down, Depressed, Hopeless 1 0 1  PHQ - 2 Score 2 1 2   Altered sleeping 2 1 0  Tired, decreased energy 1 1 1   Change in appetite 3 1 0  Feeling bad or failure about yourself  3 0 1  Trouble concentrating 1 0 0  Moving slowly or fidgety/restless 3 0 0  Suicidal thoughts 2 0 1  PHQ-9 Score 17 4 5   Difficult doing work/chores Very difficult Somewhat difficult Very difficult    Scribe for Treatment Team: Gigi Gin 03/09/2022 4:56 PM
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
## 2022-03-09 NOTE — ED Notes (Signed)
Patient A&Ox4. Patient present with substance use disorder. Patient denies SI/HI and AVH. Patient denies any physical complaints when asked. No acute distress noted. Support and encouragement provided. Routine safety checks conducted according to facility protocol. Encouraged patient to notify staff if thoughts of harm toward self or others arise. Patient verbalize understanding and agreement. Will continue to monitor for safety.

## 2022-03-09 NOTE — ED Notes (Signed)
Patient eating dinner in dayroom. Respirations equal and unlabored, skin warm and dry, NAD. Routine safety checks conducted according to facility protocol. Will continue to monitor for safety.  
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
## 2022-03-10 DIAGNOSIS — Z9151 Personal history of suicidal behavior: Secondary | ICD-10-CM | POA: Diagnosis not present

## 2022-03-10 DIAGNOSIS — Z1152 Encounter for screening for COVID-19: Secondary | ICD-10-CM | POA: Diagnosis not present

## 2022-03-10 DIAGNOSIS — F1414 Cocaine abuse with cocaine-induced mood disorder: Secondary | ICD-10-CM | POA: Diagnosis not present

## 2022-03-10 DIAGNOSIS — Z79899 Other long term (current) drug therapy: Secondary | ICD-10-CM | POA: Diagnosis not present

## 2022-03-10 LAB — URINALYSIS, COMPLETE (UACMP) WITH MICROSCOPIC
Bacteria, UA: NONE SEEN
Bilirubin Urine: NEGATIVE
Glucose, UA: NEGATIVE mg/dL
Hgb urine dipstick: NEGATIVE
Ketones, ur: NEGATIVE mg/dL
Leukocytes,Ua: NEGATIVE
Nitrite: NEGATIVE
Protein, ur: NEGATIVE mg/dL
Specific Gravity, Urine: 1.01 (ref 1.005–1.030)
pH: 7 (ref 5.0–8.0)

## 2022-03-10 LAB — GC/CHLAMYDIA PROBE AMP (~~LOC~~) NOT AT ARMC
Chlamydia: NEGATIVE
Comment: NEGATIVE
Comment: NORMAL
Neisseria Gonorrhea: NEGATIVE

## 2022-03-10 NOTE — BHH Group Notes (Signed)
[place] LCSW Group Therapy Note  Date/Time: 03/10/2022 at 2:15PM  Type of Therapy/Topic:  Group Therapy:  Balance in Life  Participation Level:  Active  Description of Group:   This group will address the importance of considering the journey and not just the destination. Patients will be encouraged to process areas in their lives where they found it hard to focus on the good rather than the bad, and identify reasons for maintaining such thought pattern. Facilitator will guide patients utilizing problem- solving interventions to address and improve the way each patient views themselves and their situation. Patients will work through understanding and applying humility when it comes to life changes and the interactions we have with others. Patients will be encouraged to explore ways that they will move forward throughout life's journey, and make healthier decisions for themselves.   Therapeutic Goals: Patient will identify two or more emotions or situations that they observed or felt throughout watching the video clip. Patient will identify signs where they may have responded to someone or situation due to their current circumstance.  Patient will identify two ways to set better habits in order to achieve more peace in their lives. Patient will demonstrate ability to communicate their needs through discussion and/or role plays.  Summary of Patient Progress: Patient actively participated in group on today. Patient reports that the video clip definitely made him reflect on the way he views his situation and circumstance. Patient reports humility is important and being considerate of others. Patient also expressed the importance of learning the difference between wisdom and foolishness. Patient reports feeling encouraged from watching the video, and his was able to provide feedback to staff and peers.    Therapeutic Modalities:   Cognitive Behavioral Therapy Solution-Focused Therapy Assertiveness  Training  Lucius Conn, [place] Clinical Social Worker [place] [facility] Ph: [phone]

## 2022-03-10 NOTE — ED Notes (Signed)
Pt is in the bed sleeping. Respirations are even and unlabored. No acute distress noted. Will continue to monitor for safety. 

## 2022-03-10 NOTE — ED Notes (Signed)
Pt is in the dayroom watching TV.  Respirations are even and unlabored. No acute distress noted. Will continue to monitor for safety. 

## 2022-03-10 NOTE — ED Notes (Signed)
Patient awake and alert on unit sitting in dayroom eating breakfast.  Patient is calm and pleasant.  Quiet and without complaint at this time.  Patient encouraged to make needs known.  No evidence of withdrawal at this time.  Will monitor and provide safe environment.  Denies avh shi or plan.

## 2022-03-10 NOTE — Discharge Planning (Signed)
Referral has been sent to [facility] for review. Patient reports no interest in long term placement at this time. Patient aware that LCSW will follow up to provide updates regarding [facility]. No other needs to report at this time. Patient is connected with an ACT team and peer support specialist via [employer]. Updates will be provided as received.   Lucius Conn, LCSW Clinical Social Worker [place] [facility] Ph: [phone]

## 2022-03-10 NOTE — ED Provider Notes (Signed)
Behavioral Health Progress Note  Date and Time: 03/10/2022 10:23 AM Name: Bryan Payne MRN:  [id number]  Subjective:   Bryan Payne is a 27 yo male who is unhoused with a past psychiatric history significant for schizophrenia, SI, polysubstance abuse stimulant use disorder (crack cocaine type) who presents on 03/09/2022 to [facility] seeking detox and residential rehabilitation.    Patient evaluated on the unit, reports mood is stable.  Endorses adequate sleep and appetite. Denies suicidal ideation. On assessment, patient denies homicidal ideation.  Denies auditory and visual hallucinations.  There are no apparent paranoid ideations.  There are no apparent delusional thought processes. Denies any side effects to currently prescribed psychiatric medications.  Denies any somatic symptoms except as stated above.   Currently pending application for [facility] residential rehabilitation.   Diagnosis:  Final diagnoses:  Cocaine abuse (HCC)  Cocaine abuse with cocaine-induced mood disorder (HCC)    Total Time spent with patient: 15 minutes  Past Psychiatric History: As documented in H&P Past Medical History: As documented in H&P Family History: As documented in H&P Family Psychiatric  History: As documented in H&P Social History: As documented in H&P  Additional Social History:    Pain Medications: Please see MAR Prescriptions: Please see MAR Over the Counter: Please see MAR History of alcohol / drug use?: Yes Longest period of sobriety (when/how long): Unknown Negative Consequences of Use: Financial, Personal relationships (Pt denies) Withdrawal Symptoms: None (Pt denies) Name of Substance 1: Crack Cocaine 1 - Age of First Use: 20s 1 - Amount (size/oz): 1 g 1 - Frequency: daily 1 - Duration: 1-2 mos 1 - Last Use / Amount: last night - approx 1 g 1 - Method of Aquiring: NA 1- Route of Use: pipe smokes                  Sleep: Good  Appetite:  Good  Current Medications:   Current Facility-Administered Medications  Medication Dose Route Frequency Provider Last Rate Last Admin   acetaminophen (TYLENOL) tablet 650 mg  650 mg Oral Q6H PRN Revonda Humphrey, NP       alum & mag hydroxide-simeth (MAALOX/MYLANTA) 200-200-20 MG/5ML suspension 30 mL  30 mL Oral Q4H PRN Revonda Humphrey, NP       hydrOXYzine (ATARAX) tablet 25 mg  25 mg Oral TID PRN Revonda Humphrey, NP       magnesium hydroxide (MILK OF MAGNESIA) suspension 30 mL  30 mL Oral Daily PRN Revonda Humphrey, NP       risperiDONE (RISPERDAL) tablet 1 mg  1 mg Oral Daily Thomes Lolling H, NP   1 mg at 03/10/22 0912   sertraline (ZOLOFT) tablet 50 mg  50 mg Oral Daily Revonda Humphrey, NP   50 mg at 03/10/22 0912   traZODone (DESYREL) tablet 50 mg  50 mg Oral QHS PRN Revonda Humphrey, NP   50 mg at 03/09/22 2128   Current Outpatient Medications  Medication Sig Dispense Refill   risperiDONE (RISPERDAL) 0.5 MG tablet Take 1 tablet (0.5 mg total) by mouth at bedtime. (Patient taking differently: Take 1 mg by mouth daily.) 30 tablet 3   sertraline (ZOLOFT) 50 MG tablet Take 50 mg by mouth daily.      Labs  Lab Results:  Admission on 03/09/2022  Component Date Value Ref Range Status   SARS Coronavirus 2 by RT PCR 03/09/2022 NEGATIVE  NEGATIVE Final   Comment: (NOTE) SARS-CoV-2 target nucleic acids are NOT DETECTED.  The SARS-CoV-2 RNA is generally detectable in upper respiratory specimens during the acute phase of infection. The lowest concentration of SARS-CoV-2 viral copies this assay can detect is 138 copies/mL. A negative result does not preclude SARS-Cov-2 infection and should not be used as the sole basis for treatment or other patient management decisions. A negative result may occur with  improper specimen collection/handling, submission of specimen other than nasopharyngeal swab, presence of viral mutation(s) within the areas targeted by this assay, and inadequate number of  viral copies(<138 copies/mL). A negative result must be combined with clinical observations, patient history, and epidemiological information. The expected result is Negative.  Fact Sheet for Patients:  EntrepreneurPulse.com.au  Fact Sheet for Healthcare Providers:  IncredibleEmployment.be  This test is no                          t yet approved or cleared by the [place] FDA and  has been authorized for detection and/or diagnosis of SARS-CoV-2 by FDA under an Emergency Use Authorization (EUA). This EUA will remain  in effect (meaning this test can be used) for the duration of the COVID-19 declaration under Section 564(b)(1) of the Act, 21 U.S.C.section 360bbb-3(b)(1), unless the authorization is terminated  or revoked sooner.       Influenza A by PCR 03/09/2022 NEGATIVE  NEGATIVE Final   Influenza B by PCR 03/09/2022 NEGATIVE  NEGATIVE Final   Comment: (NOTE) The Xpert Xpress SARS-CoV-2/FLU/RSV plus assay is intended as an aid in the diagnosis of influenza from Nasopharyngeal swab specimens and should not be used as a sole basis for treatment. Nasal washings and aspirates are unacceptable for Xpert Xpress SARS-CoV-2/FLU/RSV testing.  Fact Sheet for Patients: EntrepreneurPulse.com.au  Fact Sheet for Healthcare Providers: IncredibleEmployment.be  This test is not yet approved or cleared by the [place] FDA and has been authorized for detection and/or diagnosis of SARS-CoV-2 by FDA under an Emergency Use Authorization (EUA). This EUA will remain in effect (meaning this test can be used) for the duration of the COVID-19 declaration under Section 564(b)(1) of the Act, 21 U.S.C. section 360bbb-3(b)(1), unless the authorization is terminated or revoked.     Resp Syncytial Virus by PCR 03/09/2022 NEGATIVE  NEGATIVE Final   Comment: (NOTE) Fact Sheet for  Patients: EntrepreneurPulse.com.au  Fact Sheet for Healthcare Providers: IncredibleEmployment.be  This test is not yet approved or cleared by the [place] FDA and has been authorized for detection and/or diagnosis of SARS-CoV-2 by FDA under an Emergency Use Authorization (EUA). This EUA will remain in effect (meaning this test can be used) for the duration of the COVID-19 declaration under Section 564(b)(1) of the Act, 21 U.S.C. section 360bbb-3(b)(1), unless the authorization is terminated or revoked.  Performed at [facility], [place] [address], [place], [place] [address]    WBC 03/09/2022 9.0  4.0 - 10.5 K/uL Final   RBC 03/09/2022 5.28  4.22 - 5.81 MIL/uL Final   Hemoglobin 03/09/2022 16.1  13.0 - 17.0 g/dL Final   HCT 03/09/2022 48.7  39.0 - 52.0 % Final   MCV 03/09/2022 92.2  80.0 - 100.0 fL Final   MCH 03/09/2022 30.5  26.0 - 34.0 pg Final   MCHC 03/09/2022 33.1  30.0 - 36.0 g/dL Final   RDW 03/09/2022 12.5  11.5 - 15.5 % Final   Platelets 03/09/2022 283  150 - 400 K/uL Final   nRBC 03/09/2022 0.0  0.0 - 0.2 % Final  Neutrophils Relative % 03/09/2022 58  % Final   Neutro Abs 03/09/2022 5.2  1.7 - 7.7 K/uL Final   Lymphocytes Relative 03/09/2022 33  % Final   Lymphs Abs 03/09/2022 3.0  0.7 - 4.0 K/uL Final   Monocytes Relative 03/09/2022 6  % Final   Monocytes Absolute 03/09/2022 0.5  0.1 - 1.0 K/uL Final   Eosinophils Relative 03/09/2022 2  % Final   Eosinophils Absolute 03/09/2022 0.2  0.0 - 0.5 K/uL Final   Basophils Relative 03/09/2022 1  % Final   Basophils Absolute 03/09/2022 0.1  0.0 - 0.1 K/uL Final   Immature Granulocytes 03/09/2022 0  % Final   Abs Immature Granulocytes 03/09/2022 0.03  0.00 - 0.07 K/uL Final   Performed at [facility], [address] [place], [place] [address]   Sodium 03/09/2022 140  135 - 145 mmol/L Final   Potassium 03/09/2022 4.0  3.5 - 5.1 mmol/L Final   Chloride 03/09/2022 102  98 -  111 mmol/L Final   CO2 03/09/2022 30  22 - 32 mmol/L Final   Glucose, Bld 03/09/2022 97  70 - 99 mg/dL Final   Glucose reference range applies only to samples taken after fasting for at least 8 hours.   BUN 03/09/2022 9  6 - 20 mg/dL Final   Creatinine, Ser 03/09/2022 1.03  0.61 - 1.24 mg/dL Final   Calcium 56/38/7564 9.1  8.9 - 10.3 mg/dL Final   Total Protein 33/29/5188 5.6 (L)  6.5 - 8.1 g/dL Final   Albumin 41/66/0630 3.6  3.5 - 5.0 g/dL Final   AST 16/02/930 17  15 - 41 U/L Final   ALT 03/09/2022 21  0 - 44 U/L Final   Alkaline Phosphatase 03/09/2022 30 (L)  38 - 126 U/L Final   Total Bilirubin 03/09/2022 1.1  0.3 - 1.2 mg/dL Final   GFR, Estimated 03/09/2022 >60  >60 mL/min Final   Comment: (NOTE) Calculated using the CKD-EPI Creatinine Equation (2021)    Anion gap 03/09/2022 8  5 - 15 Final   Performed at [facility], [address] [place], [place] [address]   Hgb A1c MFr Bld 03/09/2022 5.5  4.8 - 5.6 % Final   Comment: (NOTE) Pre diabetes:          5.7%-6.4%  Diabetes:              >6.4%  Glycemic control for   <7.0% adults with diabetes    Mean Plasma Glucose 03/09/2022 111.15  mg/dL Final   Performed at [facility], [address], [place], [place] [address]   Magnesium 03/09/2022 2.4  1.7 - 2.4 mg/dL Final   Performed at [facility], [address] [place], [place] [address]   Alcohol, Ethyl (B) 03/09/2022 <10  <10 mg/dL Final   Comment: (NOTE) Lowest detectable limit for serum alcohol is 10 mg/dL.  For medical purposes only. Performed at [facility], [address], [place], [place] [address]    Cholesterol 03/09/2022 179  0 - 200 mg/dL Final   Comment:        ATP III CLASSIFICATION:  <200     mg/dL   Desirable  831-517  mg/dL   Borderline High  >=616    mg/dL   High           Triglycerides 03/09/2022 80  <150 mg/dL Final   HDL 07/37/1062 44  >40 mg/dL Final   Total CHOL/HDL  Ratio 03/09/2022 4.1  RATIO Final   VLDL  03/09/2022 16  0 - 40 mg/dL Final   LDL Cholesterol 03/09/2022 119 (H)  0 - 99 mg/dL Final   Comment:        Total Cholesterol/HDL:CHD Risk Coronary Heart Disease Risk Table                     Men   Women  1/2 Average Risk   3.4   3.3  Average Risk       5.0   4.4  2 X Average Risk   9.6   7.1  3 X Average Risk  23.4   11.0        Use the calculated Patient Ratio above and the CHD Risk Table to determine the patient's CHD Risk.        ATP III CLASSIFICATION (LDL):  <100     mg/dL   Optimal  100-129  mg/dL   Near or Above                    Optimal  130-159  mg/dL   Borderline  160-189  mg/dL   High  >190     mg/dL   Very High Performed at [place] [address] [place], [place] [address]    TSH 03/09/2022 1.880  0.350 - 4.500 uIU/mL Final   Comment: Performed by a 3rd Generation assay with a functional sensitivity of <=0.01 uIU/mL. Performed at [facility], [place] [address], [place], [place] [address]    RPR Ser Ql 03/09/2022 NON REACTIVE  NON REACTIVE Final   Performed at [facility], [place] [address], [place], [place] [address]   POC Amphetamine UR 03/09/2022 None Detected  NONE DETECTED (Cut Off Level 1000 ng/mL) Final   POC Secobarbital (BAR) 03/09/2022 None Detected  NONE DETECTED (Cut Off Level 300 ng/mL) Final   POC Buprenorphine (BUP) 03/09/2022 None Detected  NONE DETECTED (Cut Off Level 10 ng/mL) Final   POC Oxazepam (BZO) 03/09/2022 None Detected  NONE DETECTED (Cut Off Level 300 ng/mL) Final   POC Cocaine UR 03/09/2022 Positive (A)  NONE DETECTED (Cut Off Level 300 ng/mL) Final   POC Methamphetamine UR 03/09/2022 None Detected  NONE DETECTED (Cut Off Level 1000 ng/mL) Final   POC Morphine 03/09/2022 None Detected  NONE DETECTED (Cut Off Level 300 ng/mL) Final   POC Methadone UR 03/09/2022 None Detected  NONE DETECTED (Cut Off Level 300 ng/mL) Final   POC Oxycodone UR 03/09/2022 None Detected  NONE DETECTED (Cut Off Level 100 ng/mL) Final    POC Marijuana UR 03/09/2022 Positive (A)  NONE DETECTED (Cut Off Level 50 ng/mL) Final   HIV Screen 4th Generation wRfx 03/09/2022 Non Reactive  Non Reactive Final   Performed at [facility], [place] [address], [place], [place] [address]   SARSCOV2ONAVIRUS 2 AG 03/09/2022 NEGATIVE  NEGATIVE Final   Comment: (NOTE) SARS-CoV-2 antigen NOT DETECTED.   Negative results are presumptive.  Negative results do not preclude SARS-CoV-2 infection and should not be used as the sole basis for treatment or other patient management decisions, including infection  control decisions, particularly in the presence of clinical signs and  symptoms consistent with COVID-19, or in those who have been in contact with the virus.  Negative results must be combined with clinical observations, patient history, and epidemiological information. The expected result is Negative.  Fact Sheet for Patients: HandmadeRecipes.com.cy  Fact Sheet for Healthcare Providers: FuneralLife.at  This test is not yet approved or cleared by the [place] FDA and  has been authorized for detection and/or diagnosis of SARS-CoV-2 by FDA under an Emergency Use Authorization (EUA).  This EUA will remain in effect (meaning this test can be used) for the duration of  the COV                          ID-19 declaration under Section 564(b)(1) of the Act, 21 U.S.C. section 360bbb-3(b)(1), unless the authorization is terminated or revoked sooner.      Blood Alcohol level:  Lab Results  Component Value Date   ETH <10 03/09/2022   ETH <10 [id number]    Metabolic Disorder Labs: Lab Results  Component Value Date   HGBA1C 5.5 03/09/2022   MPG 111.15 03/09/2022   No results found for: "PROLACTIN" Lab Results  Component Value Date   CHOL 179 03/09/2022   TRIG 80 03/09/2022   HDL 44 03/09/2022   CHOLHDL 4.1 03/09/2022   VLDL 16 03/09/2022   LDLCALC 119 (H) 03/09/2022     Therapeutic Lab Levels: No results found for: "LITHIUM" No results found for: "VALPROATE" No results found for: "CBMZ"  Physical Findings   PHQ2-9    Flowsheet Row ED from 03/09/2022 in [facility] Office Visit from 05/23/2021 in [facility] Primary [facility] at [facility] ED from 03/31/2020 in [facility] Department at [facility]  PHQ-2 Total Score 2 1 2   PHQ-9 Total Score 17 4 5       [place] ED from 03/09/2022 in [facility] ED from 10/08/2020 in [facility] Emergency Department at [facility] ED from 10/02/2020 in [facility] Emergency Department at [place] No Risk No Risk No Risk        Musculoskeletal  Strength & Muscle Tone: within normal limits Gait & Station: normal Patient leans: N/A  Psychiatric Specialty Exam  Psychiatric Specialty Exam:  Presentation  General Appearance: Appropriate for environment; Fairly Groomed Eye Contact: Appropriate Speech: Clear and Coherent Speech Volume: Normal Handedness: Unable to assess  Mood and Affect  Mood: "I'm ok" Affect: Congruent; Euthymic; Full Range  Thought Process  Thought Processes:Coherent, Linear, Goal Directed Descriptions of Associations: Intact Orientation:Full (Person, place, time) Thought Content:Linear; Logical  History of Schizophrenia/Schizoaffective disorder: None Duration of Psychotic Symptoms: None Hallucinations: None Ideas of Reference: None Suicidal Thoughts: None Homicidal Thoughts: None  Sensorium  Memory: Good Judgment: Good Insight: Good  Executive Functions  Concentration: Good Attention Span: Good Recall: Good Fund of Knowledge: Good Language: Good  Psychomotor Activity  Psychomotor Activity: Normal  Assets  Assets: Communication Skills; Desire for Improvement; Social Support  Sleep  Sleep: Good    Nutritional Assessment (For OBS and FBC  admissions only) Has the patient had a weight loss or gain of 10 pounds or more in the last 3 months?: No Has the patient had a decrease in food intake/or appetite?: Yes Does the patient have dental problems?: No Does the patient have eating habits or behaviors that may be indicators of an eating disorder including binging or inducing vomiting?: No Has the patient recently lost weight without trying?: 2.0 Has the patient been eating poorly because of a decreased appetite?: 1 Malnutrition Screening Tool Score: 3    Physical Exam  ROS Constitutional: Denies loss or gain of weight, no fatigue, no  fevers, chills, night sweats HEENT: Denies changes in vision and hearing. Respiratory: Denies SOB and cough. CV: Denies palpitations and CP. GI: Denies abdominal pain, nausea, vomiting and diarrhea. GU: Denies dysuria and urinary frequency. MSK: Denies myalgia and joint pain. Skin: Denies rash and pruritus. Neuro: Denies weakness, no numbness, no dizziness, no balance problems, no headache, no seizures, no trouble swallowing (dysphagia), no trouble speaking (aphasia or dysarthria), no confusion, no b/b incontinence Psychiatric: Denies recent changes in mood. Denies anxiety and depression.   Physical Exam General: No acute distress. Awake and conversant. Eyes: Normal conjunctiva, anicteric. Round symmetric pupils. ENT: Hearing grossly intact. No nasal discharge. Neck: Neck is supple. No masses or thyromegaly. Respiratory: Respirations are non-labored. No wheezing. Skin: Warm. No rashes or ulcers. Psych: Alert and oriented. Cooperative, Appropriate mood and affect, Normal judgment. CV: No lower extremity edema. MSK: Normal ambulation. No clubbing or cyanosis. Neuro: Sensation and CN II-XII grossly normal.  Blood pressure 108/64, pulse 60, temperature 98 F (36.7 C), temperature source Oral, resp. rate 17, SpO2 100 %. There is no height or weight on file to calculate BMI.  Treatment Plan  Summary: Daily contact with patient to assess and evaluate symptoms and progress in treatment and Medication management  #Schizophrenia Continue Risperdal 1 mg daily  #Depression Continue Zoloft 50 mg daily  Continue the following PRNs: Tylenol, Maalox, Atarax, magnesium, trazodone  Dispo: pending application to [facility] residential rehabilitation   Christene Slates, MD 03/10/2022 10:23 AM

## 2022-03-11 DIAGNOSIS — Z9151 Personal history of suicidal behavior: Secondary | ICD-10-CM | POA: Diagnosis not present

## 2022-03-11 DIAGNOSIS — F1414 Cocaine abuse with cocaine-induced mood disorder: Secondary | ICD-10-CM | POA: Diagnosis not present

## 2022-03-11 DIAGNOSIS — Z79899 Other long term (current) drug therapy: Secondary | ICD-10-CM | POA: Diagnosis not present

## 2022-03-11 DIAGNOSIS — Z1152 Encounter for screening for COVID-19: Secondary | ICD-10-CM | POA: Diagnosis not present

## 2022-03-11 MED ORDER — NICOTINE 14 MG/24HR TD PT24
14.0000 mg | MEDICATED_PATCH | Freq: Every day | TRANSDERMAL | Status: DC
  Administered 2022-03-11 – 2022-03-12 (×2): 14 mg via TRANSDERMAL
  Filled 2022-03-11 (×2): qty 1
  Filled 2022-03-11: qty 14

## 2022-03-11 MED ORDER — SERTRALINE HCL 50 MG PO TABS: 50.0000 mg | ORAL_TABLET | Freq: Every day | ORAL | 1 refills | Status: DC

## 2022-03-11 MED ORDER — NICOTINE 14 MG/24HR TD PT24: 14.0000 mg | MEDICATED_PATCH | Freq: Every day | TRANSDERMAL | 1 refills | Status: DC

## 2022-03-11 MED ORDER — RISPERIDONE 1 MG PO TABS: 1.0000 mg | ORAL_TABLET | Freq: Every day | ORAL | 1 refills | Status: DC

## 2022-03-11 MED ORDER — TRAZODONE HCL 50 MG PO TABS: 50.0000 mg | ORAL_TABLET | Freq: Every evening | ORAL | 1 refills | Status: DC | PRN

## 2022-03-11 MED ORDER — HYDROXYZINE HCL 25 MG PO TABS: 25.0000 mg | ORAL_TABLET | Freq: Three times a day (TID) | ORAL | 1 refills | Status: DC | PRN

## 2022-03-11 NOTE — ED Notes (Signed)
Pt is in the bed sleeping. Respirations are even and unlabored. No acute distress noted. Will continue to monitor for safety. 

## 2022-03-11 NOTE — ED Notes (Signed)
Pt complained of headache level 7 on a scale of 0-10.

## 2022-03-11 NOTE — ED Notes (Signed)
Patient accepted scheduled meds w/o difficulty. Converses with staff and other patients on the unit. Breakfast provided. Denies SI/HI/AVH. Safety maintained and will continue to monitor.

## 2022-03-11 NOTE — Discharge Instructions (Signed)
List of Residential placements:   [facility]: [phone]  [facility]: [place], [place] [phone]: Male and male facility; 30-day program: (uninsured and Medicaid such as [facility], [facility], [facility], partners)  [facility]: [phone]; men and women's facility; 28 days; Can have Medicaid tailored plan ([facility] or [facility])  [facility]: [phone] Ang[place] or [place]; 28 day program; must be fully detox; tailored Medicaid or no insurance  [facility] in [place], [place]; [phone]; 28 day all males program; no insurance accepted  [facility] in [place]: [place] [phone] (no insurance or Medicaid only); 90 days; outpatient services but provide housing in apartments [place]  [facility] Admission: [phone]: Patient must complete phone screening for placement: , [place]; 6 month program; uninsured, Medicaid, and [facility] insurance.   [facility]: no insurance required; [phone]  [facility]: [phone]; Intake: Robert; Must fill out application online; Victor Delay [phone] x [phone]  [facility] in [place], [place]: [phone]; Admissions Coordinators Mr. Dennis or David Gibson; 90 day program.  [facility]: [place], [place] [phone]; Co-Ed 9 month to a year program; Online application; Men entry fee is $500 (6-12months);  [facility]: [address] [place], [place] [address]; no fee or insurance required; minimum of 2 years; Highly structured; work based; Intake Coordinator is Chris [phone]  [facility] in [place], [place]: [phone]; Fax number is [phone]; website: [facility].org; Requires 3-6 page autobiography; 2 year program (18 months and then 6month transitional housing); Admission fee is $300; no insurance needed; work program  [facility] in [place], [place]: Front Desk Staff: Reeci [phone]: They have a  Men's Regenerations Program 6-9months. Free program; There is an initial $300 fee however, they are willing to work with patients regarding that. Application is online.  [facility]: Admissions [phone] Benjamin Cox ext [phone]; Any 7-90 day program is out of pocket; 12 month program is free of charge; there is a $275 entry fee; Patient is responsible for own transportation   [facility] [address] [place], [place], [address] [phone] phone  New Patient Assessment/Therapy Walk-Ins:  Monday and Wednesday: 8 am until slots are full. Every 1st and 2nd Fridays of the month: 1 pm - 5 pm.  NO ASSESSMENT/THERAPY WALK-INS ON TUESDAYS OR THURSDAYS  New Patient Assessment/Medication Management Walk-Ins:  Monday - Friday:  8 am - 11 am.  For all walk-ins, we ask that you arrive by 7:30 am because patients will be seen in the order of arrival.  Availability is limited; therefore, you may not be seen on the same day that you walk-in.  Our goal is to serve and meet the needs of our community to the best of our ability.  SUBSTANCE USE TREATMENT for Medicaid and State Funded/IPRS  [facility] [address] [place], [place], [address] [phone] phone NOTE: [facility] is no longer offering IOP services.  Serves those who are low-income or have no insurance.  [facility] [address], [place], [place], [address] [phone] phone [phone] fax NOTE: Does have Substance Abuse-Intensive Outpatient Program (SAIOP) as well as transitional housing if eligible.  [facility] [address] [place], [place], [address] [phone] phone [phone] fax  [facility] [address] [place], [place], [address] [phone] phone [phone] fax  HALFWAY HOUSES:  [facility] [phone]  [facility] www.oxfordvacancies.com  12 STEP PROGRAMS:  Alcoholics Anonymous of [place]  https://aagreensboronc.com/meeting  Narcotics Anonymous of [place] [email or web address]/[email or web address]    Al-Anon of [place], [place] www.greens[place]alanon.org/find-meetings.html  Nar-Anon https://nar-anon.org/find-[email or web address]-meetin 

## 2022-03-11 NOTE — ED Provider Notes (Signed)
FBC/OBS ASAP Discharge Summary  Date and Time: 03/11/2022 10:48 AM  Name: Bryan Payne  MRN:  [id number]   Discharge Diagnoses:  Final diagnoses:  Cocaine abuse ([place])  Cocaine abuse with cocaine-induced mood disorder ([place])   HPI:   Bryan Payne is a 27 yo male who is unhoused with a past psychiatric history significant for schizophrenia, SI, polysubstance abuse stimulant use disorder (crack cocaine type) who presents on 03/09/2022 to [facility] seeking detox and residential rehabilitation   Subjective:   On today's assessment Bryan is observed talking with another Bryan and smiling.  He is pleasant upon approach.  He is alert/oriented x 4, cooperative, calm, and attentive.  He has normal speech and behavior.  He makes good eye contact.  He continues to deny any SI/HI/AVH.  He does not appear to be responding to any internal/external stimuli.  He reports that his depression has improved and that he feels that is due to the Zoloft that was started upon admission.  He is denying any withdrawal symptoms related to cocaine at this time.  He does endorse some nicotine cravings and he is requesting a nicotine patch.  He smokes roughly 1/2 a pack of cigarettes per day  He is looking forward to being transferred to [place] residential in the a.m.  He appears motivated and states he his looking forward to it.  Stay Summary: Bryan Payne was admitted to the [facility] for Cocaine use with cocaine-induced mood disorder [facility]) and crisis management.  He was treated with the following medications Risperdal 1 mg daily, Zoloft 50 mg daily, hydroxyzine 25 mg 3 times daily as needed, trazodone 50 mg nightly and Nicotine patch 14 mg daily..  Medications were tolerated with no adverse reactions.  Bryan Payne was discharged with current medications and was instructed on how to take medications as prescribed. He was provided with 14 day sample and printed prescriptions with one month refill, per [facility]  request.  Improvement was monitored  and Bryan Payne report of symptom reduction; and Bryan Payne.  Emotional and mental status was also monitored by staff.          Bryan Payne was evaluated for stability and plans for continued recovery upon discharge.  Bryan Payne motivation was an integral factor for scheduling further treatment.  Employment, transportation, bed availability, health status, family support, and any pending legal issues were also considered during his admission. He has been accepted to [facility] on 03/12/2022.    Upon completion of this admission the Bryan Payne was both mentally and medically stable for discharge denying suicidal/homicidal ideation, auditory/visual/tactile hallucinations, delusional thoughts and paranoia.      Total Time spent with Bryan: 30 minutes  Past Psychiatric History: as documented in H&P Past Medical History: as documented in H&P Family History: as documented in H&P Family Psychiatric History: as documented in H&P Social History: as documented in H&P Tobacco Cessation:  A prescription for an FDA-approved tobacco cessation medication provided at discharge  Current Medications:  Current Facility-Administered Medications  Medication Dose Route Frequency Provider Last Rate Last Admin   acetaminophen (TYLENOL) tablet 650 mg  650 mg Oral Q6H PRN Revonda Humphrey, NP   650 mg at 03/11/22 0639   alum & mag hydroxide-simeth (MAALOX/MYLANTA) 200-200-20 MG/5ML suspension 30 mL  30 mL Oral Q4H PRN Revonda Humphrey, NP       hydrOXYzine (ATARAX) tablet 25 mg  25 mg Oral  TID PRN Revonda Humphrey, NP       magnesium hydroxide (MILK OF MAGNESIA) suspension 30 mL  30 mL Oral Daily PRN Revonda Humphrey, NP       nicotine (NICODERM CQ - dosed in mg/24 hours) patch 14 mg  14 mg Transdermal Daily Thomes Lolling H, NP       risperiDONE (RISPERDAL) tablet 1 mg  1 mg Oral Daily Thomes Lolling H, NP   1 mg  at 03/11/22 7106   sertraline (ZOLOFT) tablet 50 mg  50 mg Oral Daily Revonda Humphrey, NP   50 mg at 03/11/22 2694   traZODone (DESYREL) tablet 50 mg  50 mg Oral QHS PRN Revonda Humphrey, NP   50 mg at 03/10/22 2103   Current Outpatient Medications  Medication Sig Dispense Refill   hydrOXYzine (ATARAX) 25 MG tablet Take 1 tablet (25 mg total) by mouth 3 (three) times daily as needed for anxiety. 90 tablet 1   nicotine (NICODERM CQ - DOSED IN MG/24 HOURS) 14 mg/24hr patch Place 1 patch (14 mg total) onto the skin daily. 28 patch 1   [START ON 03/12/2022] risperiDONE (RISPERDAL) 1 MG tablet Take 1 tablet (1 mg total) by mouth daily. 30 tablet 1   sertraline (ZOLOFT) 50 MG tablet Take 1 tablet (50 mg total) by mouth daily. 30 tablet 1   traZODone (DESYREL) 50 MG tablet Take 1 tablet (50 mg total) by mouth at bedtime as needed for sleep. 30 tablet 1    PTA Medications:  Facility Ordered Medications  Medication   acetaminophen (TYLENOL) tablet 650 mg   alum & mag hydroxide-simeth (MAALOX/MYLANTA) 200-200-20 MG/5ML suspension 30 mL   magnesium hydroxide (MILK OF MAGNESIA) suspension 30 mL   hydrOXYzine (ATARAX) tablet 25 mg   traZODone (DESYREL) tablet 50 mg   risperiDONE (RISPERDAL) tablet 1 mg   sertraline (ZOLOFT) tablet 50 mg   nicotine (NICODERM CQ - dosed in mg/24 hours) patch 14 mg   PTA Medications  Medication Sig   [START ON 03/12/2022] risperiDONE (RISPERDAL) 1 MG tablet Take 1 tablet (1 mg total) by mouth daily.   hydrOXYzine (ATARAX) 25 MG tablet Take 1 tablet (25 mg total) by mouth 3 (three) times daily as needed for anxiety.   nicotine (NICODERM CQ - DOSED IN MG/24 HOURS) 14 mg/24hr patch Place 1 patch (14 mg total) onto the skin daily.   sertraline (ZOLOFT) 50 MG tablet Take 1 tablet (50 mg total) by mouth daily.   traZODone (DESYREL) 50 MG tablet Take 1 tablet (50 mg total) by mouth at bedtime as needed for sleep.       03/09/2022    8:25 AM 05/23/2021    9:06 AM  03/31/2020    8:26 AM  Depression screen PHQ 2/9  Decreased Interest 1 1 1   Down, Depressed, Hopeless 1 0 1  PHQ - 2 Score 2 1 2   Altered sleeping 2 1 0  Tired, decreased energy 1 1 1   Change in appetite 3 1 0  Feeling bad or failure about yourself  3 0 1  Trouble concentrating 1 0 0  Moving slowly or fidgety/restless 3 0 0  Suicidal thoughts 2 0 1  PHQ-9 Score 17 4 5   Difficult doing work/chores Very difficult Somewhat difficult Very difficult    Flowsheet Row ED from 03/09/2022 in [facility] from 10/08/2020 in [facility] Emergency Department at [facility] ED from 10/02/2020 in [facility] Emergency Department  at [place] No Risk No Risk No Risk       Musculoskeletal  Strength & Muscle Tone: within normal limits Gait & Station: normal Bryan leans: N/A  Psychiatric Specialty Exam  Presentation  General Appearance:  Appropriate for Environment; Casual  Eye Contact: Good  Speech: Clear and Coherent; Normal Rate  Speech Volume: Normal  Handedness: Right   Mood and Affect  Mood: Euthymic  Affect: Congruent   Thought Process  Thought Processes: Coherent  Descriptions of Associations:Intact  Orientation:Full (Time, Place and Person)  Thought Content:Logical  Diagnosis of Schizophrenia or Schizoaffective disorder in past: No data recorded   Hallucinations:Hallucinations: None  Ideas of Reference:None  Suicidal Thoughts:Suicidal Thoughts: No  Homicidal Thoughts:Homicidal Thoughts: No   Sensorium  Memory: Immediate Good; Recent Good; Remote Good  Judgment: Good  Insight: Good   Executive Functions  Concentration: Good  Attention Span: Good  Recall: Good  Fund of Knowledge: Good  Language: Good   Psychomotor Activity  Psychomotor Activity: Psychomotor Activity: Normal   Assets  Assets: Communication Skills; Desire for Improvement; Financial  Resources/Insurance; Housing; Physical Health; Resilience; Social Support   Sleep  Sleep: Sleep: Good   No data recorded  Physical Exam  Physical Exam Vitals and nursing note reviewed.  Constitutional:      General: He is not in acute distress.    Appearance: Normal appearance. He is well-developed.  HENT:     Head: Normocephalic and atraumatic.  Eyes:     General:        Right eye: No discharge.        Left eye: No discharge.  Cardiovascular:     Rate and Rhythm: Normal rate.     Heart sounds: No murmur heard. Pulmonary:     Effort: Pulmonary effort is normal. No respiratory distress.  Musculoskeletal:        General: No swelling. Normal range of motion.     Cervical back: Normal range of motion.  Skin:    Capillary Refill: Capillary refill takes less than 2 seconds.     Coloration: Skin is not jaundiced or pale.  Neurological:     Mental Status: He is alert and oriented to person, place, and time.  Psychiatric:        Attention and Perception: Attention and perception normal.        Mood and Affect: Mood normal.        Speech: Speech normal.        Behavior: Behavior normal. Behavior is cooperative.        Thought Content: Thought content normal.        Cognition and Memory: Cognition normal.        Judgment: Judgment normal.    Review of Systems  Constitutional: Negative.   HENT: Negative.    Eyes: Negative.   Respiratory: Negative.    Cardiovascular: Negative.   Musculoskeletal: Negative.   Skin: Negative.   Neurological: Negative.   Psychiatric/Behavioral: Negative.     Blood pressure 110/62, pulse (!) 53, temperature 98.7 F (37.1 C), temperature source Oral, resp. rate 18, SpO2 100 %. There is no height or weight on file to calculate BMI.  Demographic Factors:  Male, Adolescent or young adult, and Unemployed  Loss Factors: NA  Historical Factors: Impulsivity  Risk Reduction Factors:   Sense of responsibility to family, Religious beliefs  about death, Positive social support, Positive therapeutic relationship, and Positive coping skills or problem solving skills  Continued Clinical  Symptoms:  Alcohol/Substance Abuse/Dependencies Schizophrenia:   Depressive state  Cognitive Features That Contribute To Risk:  None    Suicide Risk:  Minimal: No identifiable suicidal ideation.  Patients presenting with no risk factors but with morbid ruminations; may be classified as minimal risk based on the severity of the depressive symptoms  Plan Of Care/Follow-up recommendations:  Activity:  as tolerated  Diet:  regular   Disposition:   Discharge Bryan-transport Bryan in the a.m. 03/12/2022 to [facility] residential treatment.   Upon discharge Bryan is provided a 2-week sample of Zoloft 150 mg daily, Risperdal 1 mg daily, hydroxyzine 25 mg 3 times daily as needed, trazodone 50 mg nightly as needed for sleep, nicotine 14 mg patch daily.  In addition he is provided with a printed prescription with a 1 month's refill per [facility] request.   Ardis Hughs, NP 03/11/2022, 10:48 AM

## 2022-03-11 NOTE — ED Notes (Signed)
Patient A&Ox4. Patient denies SI/Hi and AVH. Denies any physical complaints when asked. No acute distress noted. Support and encouragement provided. Routine safety checks conducted according to facility protocol. Encouraged patient to notify staff if thoughts of harm toward self or others arise. Patient verbalize understanding and agreement. Will continue to monitor for safety 

## 2022-03-11 NOTE — ED Notes (Signed)
Patient attended Dog Therapy, Patient interacted with the dog and seemed to relax around the dog, rubbing her and he seemed to enjoy the dog's company. The dog's owner talked about the growth of the dog and how she came into being a therapy dog. The group was very good and the patient's really did have a great time with the therapy dog rubbing her and talking to her, relating their experiences with dogs in their lives. Everyone enjoyed themselves including the dog Dixie. 

## 2022-03-11 NOTE — ED Notes (Signed)
In AA group meeting 

## 2022-03-11 NOTE — Discharge Planning (Signed)
Referral was received and per Enis Gash, patient has been accepted and can transfer to the facility on tomorrow by 9:00am. Update has been provided to the patient and MD made aware. Patient will need a 14-30 day supply of medication and one month refill. No nicotine gum allowed, however 14-30 day nicotine patches to be provided if needed. No other needs to report at this time.    LCSW will continue to follow up and provide updates as received.    Lucius Conn, LCSW Clinical Social Worker [facility] Ph: [phone]

## 2022-03-12 DIAGNOSIS — F1414 Cocaine abuse with cocaine-induced mood disorder: Secondary | ICD-10-CM | POA: Diagnosis not present

## 2022-03-12 DIAGNOSIS — Z9151 Personal history of suicidal behavior: Secondary | ICD-10-CM | POA: Diagnosis not present

## 2022-03-12 DIAGNOSIS — Z1152 Encounter for screening for COVID-19: Secondary | ICD-10-CM | POA: Diagnosis not present

## 2022-03-12 DIAGNOSIS — Z79899 Other long term (current) drug therapy: Secondary | ICD-10-CM | POA: Diagnosis not present

## 2022-03-12 NOTE — ED Notes (Signed)
Patient A&O x 4, ambulatory. Patient discharged in no acute distress. Patient denied SI/HI, A/VH upon discharge. Patient verbalized understanding of all discharge instructions explained by staff, to include follow up appointments, RX's and safety plan. Patient reported mood 10/10.  Pt belongings returned to patient from locker #28 intact. Patient escorted to lobby via staff for transport to destination. Safety maintained.  

## 2022-03-12 NOTE — ED Notes (Signed)
"  Pt denies SI, plan, and intention.  Suicide safety plan completed, reviewed with this RN, given to the patient, and a copy in the chart." 

## 2022-03-12 NOTE — ED Notes (Signed)
Patient A&Ox4. Patient denies SI/HI and AVH. Patient denies any physical complaints when asked. No acute distress noted. Support and encouragement provided. Routine safety checks conducted according to facility protocol. Encouraged patient to notify staff if thoughts of harm toward self or others arise. Patient verbalize understanding and agreement. Will continue to monitor for safety.    

## 2022-03-18 ENCOUNTER — Emergency Department (HOSPITAL_COMMUNITY)
Admission: EM | Admit: 2022-03-18 | Discharge: 2022-03-18 | Disposition: A | Discharge status: Home / Self Care | Payer: Medicaid Other | Attending: Emergency Medicine | Admitting: Emergency Medicine

## 2022-03-18 DIAGNOSIS — T699XXA Effect of reduced temperature, unspecified, initial encounter: Secondary | ICD-10-CM | POA: Diagnosis present

## 2022-03-18 DIAGNOSIS — X31XXXA Exposure to excessive natural cold, initial encounter: Secondary | ICD-10-CM | POA: Insufficient documentation

## 2022-03-18 NOTE — ED Triage Notes (Signed)
BIB EMS for cold exposure. Pt was seen throwing down a syringe by PD. Admit to taking "crack" but Aox4, and ambulatory. Pt requested to come to the ER for "being cold". He was seen at [facility], tapping on the drive-thru window.

## 2022-03-18 NOTE — ED Provider Notes (Signed)
[place] EMERGENCY DEPARTMENT AT [facility] Provider Note   CSN: [id number] Arrival date & time: 03/18/22  0436     History  Chief Complaint  Patient presents with   Cold Exposure    Bryan Payne is a 27 y.o. male.  The history is provided by the patient and the EMS personnel.  Bryan Payne is a 27 y.o. male who presents to the Emergency Department complaining of cold exposure.  He presents to the emergency department by EMS for cold exposure.  He states that he tried to walk across the river and his feet got wet.  He felt very cold and sought help by EMS.  He does report using crack cocaine.  He states that he uses it because it gives him the energy to walk to give plasma.  He denies any current SI, HI or somatic complaints.  He does not seek help for his crack use at this point.     Home Medications Prior to Admission medications   Medication Sig Start Date End Date Taking? Authorizing Provider  hydrOXYzine (ATARAX) 25 MG tablet Take 1 tablet (25 mg total) by mouth 3 (three) times daily as needed for anxiety. 03/11/22   Revonda Humphrey, NP  nicotine (NICODERM CQ - DOSED IN MG/24 HOURS) 14 mg/24hr patch Place 1 patch (14 mg total) onto the skin daily. 03/11/22   Revonda Humphrey, NP  risperiDONE (RISPERDAL) 1 MG tablet Take 1 tablet (1 mg total) by mouth daily. 03/12/22   Revonda Humphrey, NP  sertraline (ZOLOFT) 50 MG tablet Take 1 tablet (50 mg total) by mouth daily. 03/11/22   Revonda Humphrey, NP  traZODone (DESYREL) 50 MG tablet Take 1 tablet (50 mg total) by mouth at bedtime as needed for sleep. 03/11/22   Revonda Humphrey, NP      Allergies    Vicks vaporub [[id number]-eucalypt-men-turp-pet]    Review of Systems   Review of Systems  All other systems reviewed and are negative.   Physical Exam Updated Vital Signs BP 130/83   Pulse 81   Temp 98.1 F (36.7 C) (Oral)   Resp 20   SpO2 98%  Physical Exam Vitals and nursing note reviewed.   Constitutional:      Appearance: He is well-developed.  HENT:     Head: Normocephalic and atraumatic.  Cardiovascular:     Rate and Rhythm: Normal rate and regular rhythm.  Pulmonary:     Effort: Pulmonary effort is normal. No respiratory distress.  Musculoskeletal:        General: No tenderness.     Comments: 2+ DP pulses bilaterally.  Removed shoes and socks were wet.  He does have cool feet with brisk cap refill.  No significant skin breakdown.  Wiggles toes bilaterally.  Skin:    General: Skin is warm and dry.  Neurological:     Mental Status: He is alert and oriented to person, place, and time.  Psychiatric:        Behavior: Behavior normal.     ED Results / Procedures / Treatments   Labs (all labs ordered are listed, but only abnormal results are displayed) Labs Reviewed - No data to display  EKG None  Radiology No results found.  Procedures Procedures    Medications Ordered in ED Medications - No data to display  ED Course/ Medical Decision Making/ A&P  Medical Decision Making  Patient here for evaluation of cold exposure.  He states that he was in cold water in his feet.  The rest of his clothing is dry.  He is not actively suicidal or homicidal.  No evidence of frostbite or hypothermia.  His wet shoes and socks were removed and these were replaced with dry socks.  Feel at this point he is safe for discharge with outpatient resources.        Final Clinical Impression(s) / ED Diagnoses Final diagnoses:  Cold exposure, initial encounter    Rx / DC Orders ED Discharge Orders     None         Quintella Reichert, MD 03/18/22 [phone]

## 2022-03-19 ENCOUNTER — Emergency Department (HOSPITAL_COMMUNITY)
Admission: EM | Admit: 2022-03-19 | Discharge: 2022-03-19 | Disposition: A | Discharge status: Home / Self Care | Payer: Medicaid Other | Attending: Emergency Medicine | Admitting: Emergency Medicine

## 2022-03-19 ENCOUNTER — Other Ambulatory Visit: Payer: Self-pay

## 2022-03-19 DIAGNOSIS — R739 Hyperglycemia, unspecified: Secondary | ICD-10-CM | POA: Diagnosis not present

## 2022-03-19 DIAGNOSIS — F1910 Other psychoactive substance abuse, uncomplicated: Secondary | ICD-10-CM | POA: Insufficient documentation

## 2022-03-19 DIAGNOSIS — R45851 Suicidal ideations: Secondary | ICD-10-CM | POA: Diagnosis not present

## 2022-03-19 DIAGNOSIS — R7309 Other abnormal glucose: Secondary | ICD-10-CM

## 2022-03-19 DIAGNOSIS — E876 Hypokalemia: Secondary | ICD-10-CM | POA: Insufficient documentation

## 2022-03-19 DIAGNOSIS — F1410 Cocaine abuse, uncomplicated: Secondary | ICD-10-CM | POA: Diagnosis not present

## 2022-03-19 DIAGNOSIS — D72829 Elevated white blood cell count, unspecified: Secondary | ICD-10-CM | POA: Insufficient documentation

## 2022-03-19 DIAGNOSIS — F17210 Nicotine dependence, cigarettes, uncomplicated: Secondary | ICD-10-CM | POA: Diagnosis not present

## 2022-03-19 DIAGNOSIS — R7401 Elevation of levels of liver transaminase levels: Secondary | ICD-10-CM | POA: Insufficient documentation

## 2022-03-19 LAB — COMPREHENSIVE METABOLIC PANEL
ALT: 29 U/L (ref 0–44)
AST: 44 U/L — ABNORMAL HIGH (ref 15–41)
Albumin: 3.7 g/dL (ref 3.5–5.0)
Alkaline Phosphatase: 38 U/L (ref 38–126)
Anion gap: 13 (ref 5–15)
BUN: 11 mg/dL (ref 6–20)
CO2: 24 mmol/L (ref 22–32)
Calcium: 9.2 mg/dL (ref 8.9–10.3)
Chloride: 103 mmol/L (ref 98–111)
Creatinine, Ser: 0.9 mg/dL (ref 0.61–1.24)
GFR, Estimated: >60 mL/min (ref >60)
Glucose, Bld: 132 mg/dL — ABNORMAL HIGH (ref 70–99)
Potassium: 3.3 mmol/L — ABNORMAL LOW (ref 3.5–5.1)
Sodium: 140 mmol/L (ref 135–145)
Total Bilirubin: 0.9 mg/dL (ref 0.3–1.2)
Total Protein: 5.9 g/dL — ABNORMAL LOW (ref 6.5–8.1)

## 2022-03-19 LAB — CBC WITH DIFFERENTIAL/PLATELET
Abs Immature Granulocytes: 0.05 10*3/uL (ref 0.00–0.07)
Basophils Absolute: 0.1 10*3/uL (ref 0.0–0.1)
Basophils Relative: 0 %
Eosinophils Absolute: 0.3 10*3/uL (ref 0.0–0.5)
Eosinophils Relative: 2 %
HCT: 41.6 % (ref 39.0–52.0)
Hemoglobin: 14.4 g/dL (ref 13.0–17.0)
Immature Granulocytes: 0 %
Lymphocytes Relative: 19 %
Lymphs Abs: 2.4 10*3/uL (ref 0.7–4.0)
MCH: 31.9 pg (ref 26.0–34.0)
MCHC: 34.6 g/dL (ref 30.0–36.0)
MCV: 92 fL (ref 80.0–100.0)
Monocytes Absolute: 1 10*3/uL (ref 0.1–1.0)
Monocytes Relative: 8 %
Neutro Abs: 8.6 10*3/uL — ABNORMAL HIGH (ref 1.7–7.7)
Neutrophils Relative %: 71 %
Platelets: 262 10*3/uL (ref 150–400)
RBC: 4.52 MIL/uL (ref 4.22–5.81)
RDW: 12.9 % (ref 11.5–15.5)
WBC: 12.3 10*3/uL — ABNORMAL HIGH (ref 4.0–10.5)
nRBC: 0 % (ref 0.0–0.2)

## 2022-03-19 LAB — ACETAMINOPHEN LEVEL: Acetaminophen (Tylenol), Serum: <10 ug/mL — ABNORMAL LOW (ref 10–30)

## 2022-03-19 LAB — SALICYLATE LEVEL: Salicylate Lvl: <7 mg/dL — ABNORMAL LOW (ref 7.0–30.0)

## 2022-03-19 LAB — ETHANOL: Alcohol, Ethyl (B): <10 mg/dL (ref <10)

## 2022-03-19 MED ORDER — ACETAMINOPHEN 325 MG PO TABS
650.0000 mg | ORAL_TABLET | ORAL | Status: DC | PRN
  Administered 2022-03-19: 650 mg via ORAL
  Filled 2022-03-19: qty 2

## 2022-03-19 MED ORDER — NICOTINE 14 MG/24HR TD PT24
14.0000 mg | MEDICATED_PATCH | Freq: Every day | TRANSDERMAL | Status: DC
  Filled 2022-03-19: qty 1

## 2022-03-19 MED ORDER — SERTRALINE HCL 50 MG PO TABS
50.0000 mg | ORAL_TABLET | Freq: Every day | ORAL | Status: DC
  Administered 2022-03-19: 50 mg via ORAL
  Filled 2022-03-19: qty 1

## 2022-03-19 MED ORDER — POTASSIUM CHLORIDE CRYS ER 20 MEQ PO TBCR
40.0000 meq | EXTENDED_RELEASE_TABLET | ORAL | Status: DC
  Administered 2022-03-19: 40 meq via ORAL
  Filled 2022-03-19: qty 2

## 2022-03-19 MED ORDER — HYDROXYZINE HCL 25 MG PO TABS: 25.0000 mg | ORAL_TABLET | Freq: Three times a day (TID) | ORAL | Status: DC | PRN

## 2022-03-19 MED ORDER — ALUM & MAG HYDROXIDE-SIMETH 200-200-20 MG/5ML PO SUSP: 30.0000 mL | Freq: Four times a day (QID) | ORAL | Status: DC | PRN

## 2022-03-19 MED ORDER — RISPERIDONE 1 MG PO TABS
1.0000 mg | ORAL_TABLET | Freq: Every day | ORAL | Status: DC
  Administered 2022-03-19: 1 mg via ORAL
  Filled 2022-03-19 (×2): qty 1

## 2022-03-19 MED ORDER — TRAZODONE HCL 50 MG PO TABS: 50.0000 mg | ORAL_TABLET | Freq: Every evening | ORAL | Status: DC | PRN

## 2022-03-19 MED ORDER — ONDANSETRON HCL 4 MG PO TABS: 4.0000 mg | ORAL_TABLET | Freq: Three times a day (TID) | ORAL | Status: DC | PRN

## 2022-03-19 NOTE — ED Notes (Signed)
Pt admits to using LSD and meth with in the last hour. Pt also things he accidentally swallowed a part of his pipe.

## 2022-03-19 NOTE — ED Notes (Signed)
Pt put in purple scrubs. Pt belongings placed in locker 3. Sandwich and sprite given to the pt.

## 2022-03-19 NOTE — Discharge Instructions (Signed)
[facility]  [facility]IVE [facility]ENTER  OPENS AT 8:00 AM-3:00 PM REOPENS AT 8:00 PM  [address]  [place], [place] [address] [phone]  

## 2022-03-19 NOTE — ED Notes (Signed)
Will give pt his K once he wakes up

## 2022-03-19 NOTE — Consult Note (Signed)
This provider attempted to assess patient this morning, patient was in and out of sleep.  Patient appeared to  be drowsy and sleepy. Patient stated "I need some time to sleep".   Patient was not answering any question, patient went back to sleep.  RN in charge reported that patient has been sleeping but will wake everybody up for medication soon.  Will attempt to assess patient later.

## 2022-03-19 NOTE — ED Notes (Signed)
All belongings given to pt

## 2022-03-19 NOTE — ED Provider Notes (Signed)
[place] Provider Note   CSN: [id number] Arrival date & time: 03/19/22  0419     History  Chief Complaint  Patient presents with   Suicidal    Bryan Payne is a 27 y.o. male.  The history is provided by the patient.  He has history of homelessness and states that he had been using some crack cocaine tonight and that he was feeling worthless.  He denies any actual suicidal thoughts, just general sense that he would be better off if he were not here.  He denies ethanol use and denies other drug use.  He denies homicidal ideation.  He does endorse hallucinations but will not tell me what they are.  He specifically denies command hallucinations.   Home Medications Prior to Admission medications   Medication Sig Start Date End Date Taking? Authorizing Provider  hydrOXYzine (ATARAX) 25 MG tablet Take 1 tablet (25 mg total) by mouth 3 (three) times daily as needed for anxiety. 03/11/22   Revonda Humphrey, NP  nicotine (NICODERM CQ - DOSED IN MG/24 HOURS) 14 mg/24hr patch Place 1 patch (14 mg total) onto the skin daily. 03/11/22   Revonda Humphrey, NP  risperiDONE (RISPERDAL) 1 MG tablet Take 1 tablet (1 mg total) by mouth daily. 03/12/22   Revonda Humphrey, NP  sertraline (ZOLOFT) 50 MG tablet Take 1 tablet (50 mg total) by mouth daily. 03/11/22   Revonda Humphrey, NP  traZODone (DESYREL) 50 MG tablet Take 1 tablet (50 mg total) by mouth at bedtime as needed for sleep. 03/11/22   Revonda Humphrey, NP      Allergies    Vicks vaporub [camph-eucalypt-men-turp-pet]    Review of Systems   Review of Systems  All other systems reviewed and are negative.   Physical Exam Updated Vital Signs BP 119/83 (BP Location: Right Arm)   Pulse 76   Temp 98.7 F (37.1 C) (Oral)   Resp 18   SpO2 100%  Physical Exam Vitals and nursing note reviewed.   27 year old male, resting comfortably and in no acute distress. Vital signs are normal.  Oxygen saturation is 18%, which is normal. Head is normocephalic and atraumatic. PERRLA, EOMI. Oropharynx is clear. Neck is nontender and supple without adenopathy or JVD. Back is nontender and there is no CVA tenderness. Lungs are clear without rales, wheezes, or rhonchi. Chest is nontender. Heart has regular rate and rhythm without murmur. Abdomen is soft, flat, nontender. Extremities have no cyanosis or edema, full range of motion is present. Skin is warm and dry without rash. Neurologic: Somewhat lethargic but does answer questions appropriately, speech is spontaneous and appropriate, cranial nerves are intact, moves all extremities equally.  ED Results / Procedures / Treatments   Labs (all labs ordered are listed, but only abnormal results are displayed) Labs Reviewed - No data to display  EKG None  Radiology No results found.  Procedures Procedures    Medications Ordered in ED Medications - No data to display  ED Course/ Medical Decision Making/ A&P                             Medical Decision Making Amount and/or Complexity of Data Reviewed Labs: ordered.  Risk OTC drugs. Prescription drug management.   Crack cocaine abuse.  Depression, possibly reactive.  Vague suicidal ideation.  I note triage note which states he was using methamphetamine and  LSD, patient denies this to me.  I have reviewed his past records, and he was in the ED yesterday with cold exposure, but denied suicidal thoughts at that time.  On 01/17/2022 he was admitted to [facility] for major depression following a suicide attempt.  I have reviewed inter but his laboratory test, and my interpretation is mild hypokalemia and I have ordered oral potassium, elevated random glucose, minimally elevated AST probably not of clinical significance, undetectable ethanol level, mild leukocytosis which is nonspecific.  He is felt to be medically cleared for psychiatric evaluation.  Final Clinical  Impression(s) / ED Diagnoses Final diagnoses:  Suicidal ideation  Cocaine abuse ([place])  Elevated random blood glucose level  Elevated AST (SGOT)  Hypokalemia    Rx / DC Orders ED Discharge Orders     None         Delora Fuel, MD 81/27/51 [phone]

## 2022-03-19 NOTE — ED Notes (Signed)
Pt taking a shower prior to DC

## 2022-03-19 NOTE — ED Notes (Addendum)
Pt denies any SI at this time and is awake and able to speak to [facility]. Notified [place] NP.  Pt also reported that he has a high metabolism and needs more food. [place] sandwich provided to pt

## 2022-03-19 NOTE — Progress Notes (Signed)
Shelter and substance abuse resources added to patients AVS. CSW provided a printed out sheet of outpatient mental health agencies in [place].

## 2022-03-19 NOTE — ED Notes (Signed)
Pt left discharge papers and bus pass in his room. Sitter attempted to give them to pt and pt refused to take discharge papers, but he took the bus pass.

## 2022-03-19 NOTE — ED Triage Notes (Signed)
Pt BIB [facility]. Per report pt told [facility] employees to call 911, pt proceeds to the bathroom where he was found laying in the floor by [facility]. Pt states he is suicidal. Pt a/o x4, but lethargic. Pt states he was discharged from rehab 3 days ago and has been using meth and LSD since.

## 2022-03-19 NOTE — ED Notes (Signed)
Reviewed discharge instructions with patient. Follow-up care and resources reviewed. Patient  verbalized understanding. Patient A&Ox4, VSS, and ambulatory with steady gait upon discharge.   All belongings returned to pt.

## 2022-03-19 NOTE — ED Notes (Signed)
Next potassium is due at 1430
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
## 2022-03-19 NOTE — ED Notes (Signed)
Pt talking to TTS at this time.  ?

## 2022-03-19 NOTE — [facility] Counselor (Signed)
Clinician attempted to complete TTS assessment, however patient was unable to remain alert.   Darra Lis, MSW, LCSW Triage Specialist

## 2022-03-19 NOTE — ED Notes (Signed)
Turned pt's light on to assist in waking him up for discharge.

## 2022-03-19 NOTE — ED Notes (Signed)
Pt asked for 3 bus passes. Explained that we are only allowed to give 1 per pt.

## 2022-03-20 ENCOUNTER — Emergency Department (HOSPITAL_COMMUNITY)
Admission: EM | Admit: 2022-03-20 | Discharge: 2022-03-21 | Disposition: A | Discharge status: Home / Self Care | Payer: Medicaid Other | Attending: Emergency Medicine | Admitting: Emergency Medicine

## 2022-03-20 ENCOUNTER — Other Ambulatory Visit: Payer: Self-pay

## 2022-03-20 DIAGNOSIS — R440 Auditory hallucinations: Secondary | ICD-10-CM | POA: Insufficient documentation

## 2022-03-20 DIAGNOSIS — F1410 Cocaine abuse, uncomplicated: Secondary | ICD-10-CM

## 2022-03-20 DIAGNOSIS — Z1152 Encounter for screening for COVID-19: Secondary | ICD-10-CM | POA: Diagnosis not present

## 2022-03-20 DIAGNOSIS — Z59819 Housing instability, housed unspecified: Secondary | ICD-10-CM

## 2022-03-20 DIAGNOSIS — F1414 Cocaine abuse with cocaine-induced mood disorder: Secondary | ICD-10-CM

## 2022-03-20 DIAGNOSIS — R45851 Suicidal ideations: Secondary | ICD-10-CM | POA: Diagnosis not present

## 2022-03-20 LAB — RAPID URINE DRUG SCREEN, HOSP PERFORMED
Amphetamines: NOT DETECTED
Barbiturates: NOT DETECTED
Benzodiazepines: NOT DETECTED
Cocaine: POSITIVE — AB
Opiates: NOT DETECTED
Tetrahydrocannabinol: NOT DETECTED

## 2022-03-20 LAB — ETHANOL: Alcohol, Ethyl (B): <10 mg/dL (ref <10)

## 2022-03-20 LAB — COMPREHENSIVE METABOLIC PANEL
ALT: 35 U/L (ref 0–44)
AST: 51 U/L — ABNORMAL HIGH (ref 15–41)
Albumin: 3.7 g/dL (ref 3.5–5.0)
Alkaline Phosphatase: 40 U/L (ref 38–126)
Anion gap: 9 (ref 5–15)
BUN: 17 mg/dL (ref 6–20)
CO2: 25 mmol/L (ref 22–32)
Calcium: 9 mg/dL (ref 8.9–10.3)
Chloride: 106 mmol/L (ref 98–111)
Creatinine, Ser: 1.06 mg/dL (ref 0.61–1.24)
GFR, Estimated: >60 mL/min (ref >60)
Glucose, Bld: 82 mg/dL (ref 70–99)
Potassium: 3.8 mmol/L (ref 3.5–5.1)
Sodium: 140 mmol/L (ref 135–145)
Total Bilirubin: 0.9 mg/dL (ref 0.3–1.2)
Total Protein: 6.3 g/dL — ABNORMAL LOW (ref 6.5–8.1)

## 2022-03-20 LAB — CBC WITH DIFFERENTIAL/PLATELET
Abs Immature Granulocytes: 0.05 10*3/uL (ref 0.00–0.07)
Basophils Absolute: 0.1 10*3/uL (ref 0.0–0.1)
Basophils Relative: 1 %
Eosinophils Absolute: 0.3 10*3/uL (ref 0.0–0.5)
Eosinophils Relative: 3 %
HCT: 43.1 % (ref 39.0–52.0)
Hemoglobin: 14.3 g/dL (ref 13.0–17.0)
Immature Granulocytes: 0 %
Lymphocytes Relative: 25 %
Lymphs Abs: 3 10*3/uL (ref 0.7–4.0)
MCH: 31.3 pg (ref 26.0–34.0)
MCHC: 33.2 g/dL (ref 30.0–36.0)
MCV: 94.3 fL (ref 80.0–100.0)
Monocytes Absolute: 1 10*3/uL (ref 0.1–1.0)
Monocytes Relative: 8 %
Neutro Abs: 7.9 10*3/uL — ABNORMAL HIGH (ref 1.7–7.7)
Neutrophils Relative %: 63 %
Platelets: 257 10*3/uL (ref 150–400)
RBC: 4.57 MIL/uL (ref 4.22–5.81)
RDW: 13 % (ref 11.5–15.5)
WBC: 12.3 10*3/uL — ABNORMAL HIGH (ref 4.0–10.5)
nRBC: 0 % (ref 0.0–0.2)

## 2022-03-20 MED ORDER — IBUPROFEN 400 MG PO TABS
400.0000 mg | ORAL_TABLET | Freq: Once | ORAL | Status: AC
  Administered 2022-03-20: 400 mg via ORAL
  Filled 2022-03-20: qty 1

## 2022-03-20 NOTE — ED Provider Triage Note (Signed)
Emergency Medicine Provider Triage Evaluation Note  Bryan Payne , a 27 y.o. male  was evaluated in triage.  Pt complains of suicidal ideation.  Patient reports that he is homeless and was picked up by police prior to arrival.  He does report he has a history of schizophrenia and he has been on top of his medications but reports that he may be feeling his experience visual auditory hallucinations recently.  Patient denies any homicidal ideation.  No solid plan of suicide at this time..  Review of Systems  Positive: As above Negative: As above  Physical Exam  BP 117/66 (BP Location: Right Arm)   Pulse 82   Temp 98.4 F (36.9 C) (Oral)   Resp 19   Ht 5' 7.32" (1.71 m)   Wt 74.8 kg   SpO2 99%   BMI 25.60 kg/m  Gen:   Awake, no distress   Resp:  Normal effort  MSK:   Moves extremities without difficulty  Other:    Medical Decision Making  Medically screening exam initiated at 10:02 PM.  Appropriate orders placed.  Bryan Payne was informed that the remainder of the evaluation will be completed by another provider, this initial triage assessment does not replace that evaluation, and the importance of remaining in the ED until their evaluation is complete.     Luvenia Heller, PA-C 03/20/22 2203

## 2022-03-20 NOTE — ED Triage Notes (Addendum)
Pt arrives via POV wearing purple scrubs. Pt states Was dropped off by [employer]. States he became aggressive at the [facility]. Pt believes he is concerned he is having "mini-seizures." Has been feeling emotional d/t homelessness. Endorses SI. EOTH and smoking crack cocaine use PTA.

## 2022-03-21 ENCOUNTER — Encounter (HOSPITAL_COMMUNITY): Payer: Self-pay | Admitting: Emergency Medicine

## 2022-03-21 ENCOUNTER — Ambulatory Visit (HOSPITAL_COMMUNITY)
Admission: EM | Admit: 2022-03-21 | Discharge: 2022-03-22 | Disposition: A | Discharge status: Home / Self Care | Payer: Medicaid Other | Attending: Family | Admitting: Family

## 2022-03-21 DIAGNOSIS — Z1152 Encounter for screening for COVID-19: Secondary | ICD-10-CM | POA: Insufficient documentation

## 2022-03-21 DIAGNOSIS — F1494 Cocaine use, unspecified with cocaine-induced mood disorder: Secondary | ICD-10-CM

## 2022-03-21 DIAGNOSIS — R45851 Suicidal ideations: Secondary | ICD-10-CM | POA: Insufficient documentation

## 2022-03-21 DIAGNOSIS — F1994 Other psychoactive substance use, unspecified with psychoactive substance-induced mood disorder: Secondary | ICD-10-CM

## 2022-03-21 DIAGNOSIS — Z5900 Homelessness unspecified: Secondary | ICD-10-CM | POA: Insufficient documentation

## 2022-03-21 LAB — RESP PANEL BY RT-PCR (RSV, FLU A&B, COVID)  RVPGX2
Influenza A by PCR: NEGATIVE
Influenza B by PCR: NEGATIVE
Resp Syncytial Virus by PCR: NEGATIVE
SARS Coronavirus 2 by RT PCR: NEGATIVE

## 2022-03-21 LAB — POC SARS CORONAVIRUS 2 AG: SARSCOV2ONAVIRUS 2 AG: NEGATIVE

## 2022-03-21 MED ORDER — NICOTINE 14 MG/24HR TD PT24
14.0000 mg | MEDICATED_PATCH | Freq: Every day | TRANSDERMAL | Status: DC
  Administered 2022-03-21 – 2022-03-22 (×2): 14 mg via TRANSDERMAL
  Filled 2022-03-21 (×2): qty 1

## 2022-03-21 MED ORDER — SERTRALINE HCL 50 MG PO TABS
50.0000 mg | ORAL_TABLET | Freq: Every day | ORAL | Status: DC
  Administered 2022-03-21 – 2022-03-22 (×2): 50 mg via ORAL
  Filled 2022-03-21 (×2): qty 1

## 2022-03-21 MED ORDER — TRAZODONE HCL 50 MG PO TABS
50.0000 mg | ORAL_TABLET | Freq: Every evening | ORAL | Status: DC | PRN
  Administered 2022-03-21: 50 mg via ORAL
  Filled 2022-03-21: qty 1

## 2022-03-21 MED ORDER — ALUM & MAG HYDROXIDE-SIMETH 200-200-20 MG/5ML PO SUSP: 30.0000 mL | ORAL | Status: DC | PRN

## 2022-03-21 MED ORDER — HYDROXYZINE HCL 25 MG PO TABS
25.0000 mg | ORAL_TABLET | Freq: Three times a day (TID) | ORAL | Status: DC | PRN
  Administered 2022-03-21: 25 mg via ORAL
  Filled 2022-03-21: qty 1

## 2022-03-21 MED ORDER — MAGNESIUM HYDROXIDE 400 MG/5ML PO SUSP: 30.0000 mL | Freq: Every day | ORAL | Status: DC | PRN

## 2022-03-21 MED ORDER — ACETAMINOPHEN 325 MG PO TABS
650.0000 mg | ORAL_TABLET | Freq: Four times a day (QID) | ORAL | Status: DC | PRN
  Administered 2022-03-21: 650 mg via ORAL
  Filled 2022-03-21: qty 2

## 2022-03-21 MED ORDER — RISPERIDONE 1 MG PO TABS
1.0000 mg | ORAL_TABLET | Freq: Every day | ORAL | Status: DC
  Administered 2022-03-21: 1 mg via ORAL
  Filled 2022-03-21: qty 1

## 2022-03-21 NOTE — ED Notes (Signed)
Pt's belongings in purple zone locker #5.

## 2022-03-21 NOTE — ED Provider Notes (Signed)
[facility] Provider Note   CSN: [id number] Arrival date & time: 03/20/22  2143     History  Chief Complaint  Patient presents with   Suicidal    Bryan Payne is a 27 y.o. male.  The history is provided by the patient and medical records.   27 y.o. M with hx of adjustment disorder, cocaine abuse, polysubstance abuse, presenting to the ED for psychiatric evaluation.  States he was wandering around downtown today and was picked up by police and brought to the hospital.  Reports some vague SI, does not disclose plan.  Admits to some ongoing auditory hallucinations, not necessarily new for him.  Denies visual hallucinations.  No homicidal ideation.    Home Medications Prior to Admission medications   Medication Sig Start Date End Date Taking? Authorizing Provider  hydrOXYzine (ATARAX) 25 MG tablet Take 1 tablet (25 mg total) by mouth 3 (three) times daily as needed for anxiety. 03/11/22   Revonda Humphrey, NP  nicotine (NICODERM CQ - DOSED IN MG/24 HOURS) 14 mg/24hr patch Place 1 patch (14 mg total) onto the skin daily. 03/11/22   Revonda Humphrey, NP  risperiDONE (RISPERDAL) 1 MG tablet Take 1 tablet (1 mg total) by mouth daily. 03/12/22   Revonda Humphrey, NP  sertraline (ZOLOFT) 50 MG tablet Take 1 tablet (50 mg total) by mouth daily. 03/11/22   Revonda Humphrey, NP  traZODone (DESYREL) 50 MG tablet Take 1 tablet (50 mg total) by mouth at bedtime as needed for sleep. 03/11/22   Revonda Humphrey, NP      Allergies    Vicks vaporub [camph-eucalypt-men-turp-pet]    Review of Systems   Review of Systems  Psychiatric/Behavioral:  Positive for hallucinations and suicidal ideas.   All other systems reviewed and are negative.   Physical Exam Updated Vital Signs BP 117/66 (BP Location: Right Arm)   Pulse 82   Temp 98.4 F (36.9 C) (Oral)   Resp 19   Ht 5' 7.32" (1.71 m)   Wt 74.8 kg   SpO2 99%   BMI 25.60 kg/m  Physical  Exam Vitals and nursing note reviewed.  Constitutional:      Appearance: He is well-developed.     Comments: Sleeping, awoken for exam  HENT:     Head: Normocephalic and atraumatic.  Eyes:     Conjunctiva/sclera: Conjunctivae normal.     Pupils: Pupils are equal, round, and reactive to light.  Cardiovascular:     Rate and Rhythm: Normal rate and regular rhythm.     Heart sounds: Normal heart sounds.  Pulmonary:     Effort: Pulmonary effort is normal.     Breath sounds: Normal breath sounds.  Abdominal:     General: Bowel sounds are normal.     Palpations: Abdomen is soft.  Musculoskeletal:        General: Normal range of motion.     Cervical back: Normal range of motion.  Skin:    General: Skin is warm and dry.  Neurological:     Mental Status: He is alert and oriented to person, place, and time.  Psychiatric:     Comments: Vague SI, no disclosed plan     ED Results / Procedures / Treatments   Labs (all labs ordered are listed, but only abnormal results are displayed) Labs Reviewed  COMPREHENSIVE METABOLIC PANEL - Abnormal; Notable for the following components:      Result Value  Total Protein 6.3 (*)    AST 51 (*)    All other components within normal limits  RAPID URINE DRUG SCREEN, HOSP PERFORMED - Abnormal; Notable for the following components:   Cocaine POSITIVE (*)    All other components within normal limits  CBC WITH DIFFERENTIAL/PLATELET - Abnormal; Notable for the following components:   WBC 12.3 (*)    Neutro Abs 7.9 (*)    All other components within normal limits  RESP PANEL BY RT-PCR (RSV, FLU A&B, COVID)  [id number]  ETHANOL    EKG EKG Interpretation  Date/Time:  Friday March 20 2022 22:10:34 EST Ventricular Rate:  75 PR Interval:  184 QRS Duration: 88 QT Interval:  406 QTC Calculation: 453 R Axis:   84 Text Interpretation: Sinus rhythm with marked sinus arrhythmia Otherwise normal ECG When compared with ECG of 09-Mar-2022 08:39, PREVIOUS  ECG IS PRESENT Confirmed by Gerlene Fee [phone]) on 03/20/2022 11:44:21 PM  Radiology No results found.  Procedures Procedures    Medications Ordered in ED Medications  ibuprofen (ADVIL) tablet 400 mg (400 mg Oral Given 03/20/22 2204)    ED Course/ Medical Decision Making/ A&P                             Medical Decision Making  27 y.o. M here with SI.  No specific plan voiced.  Also continued to experience auditory hallucinations which is not new.  Denies HI.  Labs obtained and are reassuring.  UDS + for cocaine.  RVP is negative.  Medically cleared.  Will get TTS consult.  TTS attempted to evaluate around 3am, however patient would not stay awake long enough to answer questions.  They will try again later this morning.  Day team to follow-up on recommendation once TTS completed.  Final Clinical Impression(s) / ED Diagnoses Final diagnoses:  Suicidal ideation  Auditory hallucination    Rx / DC Orders ED Discharge Orders     None         Larene Pickett, PA-C 03/21/22 0409    Maudie Flakes, MD 03/21/22 [phone]

## 2022-03-21 NOTE — ED Notes (Signed)
STAT lab courier called to transport labs to [facility] lab

## 2022-03-21 NOTE — ED Provider Notes (Signed)
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 [facility], [place] [address] [place], [place] [address]   POC Amphetamine UR 03/09/2022 None Detected  NONE DETECTED (Cut Off Level 1000 ng/mL) Final   POC Secobarbital (BAR) 03/09/2022 None Detected  NONE DETECTED (Cut Off Level 300 ng/mL) Final   POC Buprenorphine (BUP) 03/09/2022 None Detected  NONE DETECTED (Cut Off Level 10 ng/mL) Final   POC Oxazepam (BZO) 03/09/2022 None Detected   NONE DETECTED (Cut Off Level 300 ng/mL) Final   POC Cocaine UR 03/09/2022 Positive (A)  NONE DETECTED (Cut Off Level 300 ng/mL) Final   POC Methamphetamine UR 03/09/2022 None Detected  NONE DETECTED (Cut Off Level 1000 ng/mL) Final  POC Morphine 03/09/2022 None Detected  NONE DETECTED (Cut Off Level 300 ng/mL) Final   POC Methadone UR 03/09/2022 None Detected  NONE DETECTED (Cut Off Level 300 ng/mL) Final   POC Oxycodone UR 03/09/2022 None Detected  NONE DETECTED (Cut Off Level 100 ng/mL) Final   POC Marijuana UR 03/09/2022 Positive (A)  NONE DETECTED (Cut Off Level 50 ng/mL) Final   HIV Screen 4th Generation wRfx 03/09/2022 Non Reactive  Non Reactive Final   Performed at [facility], [place] [address] [place], [place] [address]   SARSCOV2ONAVIRUS 2 AG 03/09/2022 NEGATIVE  NEGATIVE Final   Comment: (NOTE) SARS-CoV-2 antigen NOT DETECTED.   Negative results are presumptive.  Negative results do not preclude SARS-CoV-2 infection and should not be used as the sole basis for treatment or other patient management decisions, including infection  control decisions, particularly in the presence of clinical signs and  symptoms consistent with COVID-19, or in those who have been in contact with the virus.  Negative results must be combined with clinical observations, patient history, and epidemiological information. The expected result is Negative.  Fact Sheet for Patients: HandmadeRecipes.com.cy  Fact Sheet for Healthcare Providers: FuneralLife.at  This test is not yet approved or cleared by the [place] FDA and  has been authorized for detection and/or diagnosis of SARS-CoV-2 by FDA under an Emergency Use Authorization (EUA).  This EUA will remain in effect (meaning this test can be used) for the duration of  the COV                          ID-19 declaration under Section 564(b)(1) of the Act, 21 U.S.C. section 360bbb-3(b)(1), unless  the authorization is terminated or revoked sooner.     Color, Urine 03/10/2022 STRAW (A)  YELLOW Final   APPearance 03/10/2022 CLEAR  CLEAR Final   Specific Gravity, Urine 03/10/2022 1.010  1.005 - 1.030 Final   pH 03/10/2022 7.0  5.0 - 8.0 Final   Glucose, UA 03/10/2022 NEGATIVE  NEGATIVE mg/dL Final   Hgb urine dipstick 03/10/2022 NEGATIVE  NEGATIVE Final   Bilirubin Urine 03/10/2022 NEGATIVE  NEGATIVE Final   Ketones, ur 03/10/2022 NEGATIVE  NEGATIVE mg/dL Final   Protein, ur 03/10/2022 NEGATIVE  NEGATIVE mg/dL Final   Nitrite 03/10/2022 NEGATIVE  NEGATIVE Final   Leukocytes,Ua 03/10/2022 NEGATIVE  NEGATIVE Final   RBC / HPF 03/10/2022 0-5  0 - 5 RBC/hpf Final   WBC, UA 03/10/2022 0-5  0 - 5 WBC/hpf Final   Bacteria, UA 03/10/2022 NONE SEEN  NONE SEEN Final   Squamous Epithelial / HPF 03/10/2022 0-5  0 - 5 /HPF Final   Mucus 03/10/2022 PRESENT   Final   Performed at [facility], [place] [address] [place], [place] [address]    Allergies: Vicks vaporub [[id number]-eucalypt-men-turp-pet]  Medications:  Facility Ordered Medications  Medication   acetaminophen (TYLENOL) tablet 650 mg   alum & mag hydroxide-simeth (MAALOX/MYLANTA) 200-200-20 MG/5ML suspension 30 mL   magnesium hydroxide (MILK OF MAGNESIA) suspension 30 mL   hydrOXYzine (ATARAX) tablet 25 mg   nicotine (NICODERM CQ - dosed in mg/24 hours) patch 14 mg   risperiDONE (RISPERDAL) tablet 1 mg   sertraline (ZOLOFT) tablet 50 mg   traZODone (DESYREL) tablet 50 mg   PTA Medications  Medication Sig   risperiDONE (RISPERDAL) 1 MG tablet Take 1 tablet (1 mg total) by mouth daily.   hydrOXYzine (ATARAX) 25 MG tablet Take 1 tablet (  25 mg total) by mouth 3 (three) times daily as needed for anxiety.   nicotine (NICODERM CQ - DOSED IN MG/24 HOURS) 14 mg/24hr patch Place 1 patch (14 mg total) onto the skin daily.   sertraline (ZOLOFT) 50 MG tablet Take 1 tablet (50 mg total) by mouth daily.   traZODone (DESYREL) 50 MG tablet  Take 1 tablet (50 mg total) by mouth at bedtime as needed for sleep.    Medical Decision Making  Patient remains voluntary.  He will be admitted to [facility] behavioral health continuous observation unit.  Patient will be reassessed on 03/22/2022.  [facility] ACT team Representative,  Tyrone Apple,  phone number [phone] verbalizes willingness to pick up patient on 03/22/2022 if discharged.  Laboratory studies ordered including respiratory panel and POC SARS. Additional labs resulted in emergency department on 03/20/2022.  Current medications: -Acetaminophen 650 mg every 6 as needed/mild pain -Maalox 30 mL oral every 4 as needed/digestion -Hydroxyzine 25 mg 3 times daily as needed/anxiety -Magnesium hydroxide 30 mL daily as needed/mild constipation  Home medications reinitiated including: -Nicotine 14 mg transdermal patch -Risperidone 1 mg nightly -Sertraline 50 mg daily -Trazodone 50 mg nightly as needed/sleep     Recommendations  Based on my evaluation the patient does not appear to have an emergency medical condition.  Lucky Rathke, FNP 03/21/22  3:26 PM

## 2022-03-21 NOTE — ED Notes (Signed)
Pt sleeping at present, no distress noted.  Monitoring for safety. 

## 2022-03-21 NOTE — ED Notes (Signed)
Pt A&O x 4, no distress noted, cooperative, but slightly anxious, night time meds given.  Pt resting at present.  Denies SI.  Monitoring for safety.

## 2022-03-21 NOTE — ED Notes (Signed)
Pt took a shower

## 2022-03-21 NOTE — BH Assessment (Signed)
Clinician messaged Jake Church. Hassell Done, RN and Reymundo Poll. Shearin, RN: "Hey. It's Trey with TTS. Is the pt able to engage in the assessment, if so the pt will need to be placed in a private room. Is the pt under IVC? Also is the pt medically cleared?"   Clinician awaiting response.    Vertell Novak, [place], [facility], [facility] Triage Specialist [phone]

## 2022-03-21 NOTE — Discharge Instructions (Addendum)
It was our pleasure to provide your ER care today - we hope that you feel better.  Avoid drug use as it is harmful to your physical health and mental well-being. See resource guide attached in terms of accessing inpatient or outpatient substance use treatment programs.  Also see resource guides provided as relates shelter and other social service resources in the area.   Follow up closely with primary care doctor and behavioral health provider in the coming week.  For mental health issues and/or crisis, you may also go directly to the [facility] - they are open 24/7 and walk-ins are welcome.    Return to ER if worse, new symptoms, fevers, chest pain, trouble breathing, or other emergency concern.

## 2022-03-21 NOTE — ED Notes (Signed)
Patient states that he is feeling like he is catching a cold . States that his throat is hurting and that his nose is getting stuffy. Requested tylenol. Rn place mask on patient until 2 hour covid test comes back.

## 2022-03-21 NOTE — ED Notes (Signed)
Pt admitted to [facility] .Denies SI/HI/AVH. Calm, cooperative throughout interview process. Skin assessment completed. Oriented to unit. Meal and drink offered. At [place], pt continue to denies SI/HI/AVH. Pt verbally contract for safety. Will monitor for safety.

## 2022-03-21 NOTE — BH Assessment (Signed)
Per  Jake Church. [place], RN, she tried waking the pt for the TTS assessment however he kept falling asleep. RN suggested the assessment may have to be completed in the morning unless he wakes up.    Vertell Novak, [place], [facility], [facility] Triage Specialist [phone]
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
## 2022-03-21 NOTE — ED Provider Notes (Signed)
Emergency Medicine Observation Re-evaluation Note  Bryan Payne is a 27 y.o. male, seen on rounds today.  Pt initially presented to the ED for complaints of substance use/mood disorder Pt indicates feels improved today - has eaten meal tray completely, and asking for additional food/drink - provided.  Denies thoughts of harm to self or others. No acute physical c/o.   Physical Exam  BP 127/70 (BP Location: Right Arm)   Pulse 72   Temp 98.3 F (36.8 C) (Oral)   Resp 18   Ht 1.71 m (5' 7.32")   Wt 74.8 kg   SpO2 100%   BMI 25.60 kg/m  Physical Exam General: alert, content, conversant.  Cardiac: regular rate. Lungs: breathing comfortably. Psych: normal mood and affect. Pt does not appear acutely depressed or despondent. No SI/HI. Pt is not responding to internal stimuli - no acute delusions or hallucinations noted.   ED Course / MDM    I have reviewed the labs performed to date as well as medications administered while in observation.  Recent changes in the last 24 hours include ED obs, metabolism of substance, reassessment.   Plan  Pt reports feel improved. Currently no acute psychosis, no thoughts of harm to self or others.   Pt currently appears stable for d/c.  Rec close outpatient f/u re sud, as well as pcp and bh f/u - resources provided.   Return precautions provided.       Lajean Saver, MD 03/21/22 1226

## 2022-03-22 MED ORDER — NICOTINE 14 MG/24HR TD PT24: 14.0000 mg | MEDICATED_PATCH | Freq: Every day | TRANSDERMAL | 0 refills | Status: AC

## 2022-03-22 NOTE — Discharge Instructions (Addendum)
Discharge recommendations:   Medications: Patient is to take medications as prescribed. The patient or patient's guardian is to contact a medical professional and/or outpatient provider to address any new side effects that develop. The patient or the patient's guardian should update outpatient providers of any new medications and/or medication changes.    Outpatient Follow up: [facility]. Bryan Payne with [facility] will pick you up today at 9:45 am today.  Therapy: We recommend that patient participate in individual therapy to address mental health concerns.  Atypical antipsychotics: If you are prescribed an atypical antipsychotic, it is recommended that your height, weight, BMI, blood pressure, fasting lipid panel, and fasting blood sugar be monitored by your outpatient providers.  Safety:   The following safety precautions should be taken:   No sharp objects. This includes scissors, razors, scrapers, and putty knives.   Chemicals should be removed and locked up.   Medications should be removed and locked up.   Weapons should be removed and locked up. This includes firearms, knives and instruments that can be used to cause injury.   The patient should abstain from use of illicit substances/drugs and abuse of any medications.  If symptoms worsen or do not continue to improve or if the patient becomes actively suicidal or homicidal then it is recommended that the patient return to the closest hospital emergency department, the [facility], or call 911 for further evaluation and treatment. National Suicide Prevention Lifeline 1-800-SUICIDE or 864-251-5287.  About 988 988 offers 24/7 access to trained crisis counselors who can help people experiencing mental health-related distress. People can call or text 988 or chat 988lifeline.org for themselves or if they are worried about a loved one who may need crisis support.

## 2022-03-22 NOTE — ED Notes (Signed)
Patient alert and oriented x 3. Denies SI/HI/AVH. Denies intent or plan to harm self or others. Routine conducted according to faculty protocol. Encourage patient to notify staff with any needs or concerns. Patient verbalized agreement and understanding. Will continue to monitor for safety. 

## 2022-03-22 NOTE — ED Notes (Signed)
Patient A&O x 4, ambulatory. Patient discharged in no acute distress. Patient denied SI/HI, A/VH upon discharge. Patient verbalized understanding of all discharge instructions explained by staff, to include follow up appointments, RX's and safety plan. Patient reported mood 10/10.  Pt belongings returned to patient from locker # 13 intact. Patient escorted to lobby via staff for transport to destination. Safety maintained.  Patient was discharge to [facility] with ACT Team
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
## 2022-03-22 NOTE — ED Notes (Signed)
Pt sleeping at present, no distress noted.  Monitoring for safety. 

## 2022-04-10 ENCOUNTER — Emergency Department (HOSPITAL_COMMUNITY)
Admission: EM | Admit: 2022-04-10 | Discharge: 2022-04-10 | Disposition: A | Discharge status: Home / Self Care | Payer: Medicaid Other | Attending: Emergency Medicine | Admitting: Emergency Medicine

## 2022-04-10 ENCOUNTER — Encounter (HOSPITAL_COMMUNITY): Payer: Self-pay | Admitting: Emergency Medicine

## 2022-04-10 ENCOUNTER — Other Ambulatory Visit: Payer: Self-pay

## 2022-04-10 DIAGNOSIS — R200 Anesthesia of skin: Secondary | ICD-10-CM | POA: Insufficient documentation

## 2022-04-10 DIAGNOSIS — X31XXXA Exposure to excessive natural cold, initial encounter: Secondary | ICD-10-CM | POA: Diagnosis not present

## 2022-04-10 DIAGNOSIS — X398XXA Other exposure to forces of nature, initial encounter: Secondary | ICD-10-CM

## 2022-04-10 NOTE — ED Triage Notes (Signed)
Pt BIB [facility] from [place] for cold exposure, pt reports the employees of [facility] would not let him sleep there, pt reports doing cocaine earlier today, v/s en route: 154/98, HR 83, RR 18, 99% RA
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
## 2022-04-10 NOTE — Discharge Instructions (Signed)
Substance Abuse Treatment Programs ° °Intensive Outpatient Programs °[facility]     °[address]      °[place], [place]                   °[phone]      ° °[facility] °[address] °[place], [place] °[phone] ° °[facility]     °(Inpatient and outpatient)     °[address]           °[phone]   ° °[facility] °[phone] (Suboxone and Methadone) ° °[address]      °[place], [place] [address]      °336-882-2125      ° °[address] °[place], [place] °[phone] ° °[facility] (Outpatient/Inpatient, Chemical)    °(insurance only) [phone]      °       °[facility] (Groups & Residential) °[place], [place] °[phone] ° °   °[facility]     °[address]     °[place], [place]      °[phone]      ° °[facility] (for caregivers and family) °[address] °[place], [place] °336-299-4655 ° ° ° ° ° °Residential Treatment Programs °[facility]      °[address], [place], [place] [address]  °[phone]      ° °[facility] °[address] [place], [place] [address] °[phone] ° °[facility]        °336-248-8914      ° °[facility] °[phone] ° °[facility]             °[address]                                         °[place], [place]                                                °[phone] or 336-784-9470                              ° °[facility] [place] °[address] °[place], [address] °1.877.941.8954 ° °[facility]    °[address]      °[place], [place]     °[phone]      ° °[facility] °[address] °, [place] °336-285-9073 ° °[facility]   °[address]     °[place], [place] [address]     °[phone]      °Admissions: 8am-3pm M-F ° °[facility] °[address] °[place] °336-227-7417 ° °[facility]: Residential Program (90 Days)   °[place], [place]      °336-725-8389 or 800-758-6077    ° °[facility] °[place] °(Walk in Hours over the weekend or by referral) ° °[facility] °[address], [place],  [address] °([phone] ° °Crisis Mobile: [facility]:  1-877-626-1772 (for crisis response 24 hours a day) °[facility] Hotline:      1-800-256-2452 °Outpatient Psychiatry and Counseling ° °[facility]: Mobile Crisis   Management 24 hours:  1-877-626-1772 ° °[facility] sliding scale fee and walk in schedule: M-F 8am-12pm/1pm-3pm °[address]  °[place], [place] [address] °[phone] ° °[facility] °[address] °[place], [place] [address] °336-703-9650 ° °[facility] ([facility] known [facility])- new patient walk-in appointments available Monday - Friday 8am -3pm.          °[address] °[place], [place] [address] °336-676-6840 or crisis line- 336-676-6905 ° °[facility]/ Intensive Outpatient Therapy Program °[address] °[place], [place] [address] °336-832-9804 ° °[facility]                  °Crisis Services      °[phone]      °[address]     °[place], [place] [address]                ° °[facility]   °[facility] °[phone] °[address] °[place], [place] [address] ° ° °[facility]          °[address],  °[place], [place] [address]       °[phone] ° °[facility] °[address] °[place], [place] [address] °336-292-1510 ° °[facility]    °[address]    °[place], [place] [address]     °336-632-3505      ° °Parish McKinney, MD     °[address]     °[place] [address]     °336-282-1251     °  °[facility] °[address] °[place] [address] ° °[facility]     °[address]     °[place], [place]      °336-542-2076      ° °[facility] °Shamsher Ahluwalia, MD °[address] °[place], [place] [address] °336-420-9558 ° °[facility]     °[address]     °[place], [place] [address]     °336-274-1237      ° °[facility] °[address] °[place], [place] [address] °336-854-4450 °Resources for Temporary Residential Assistance/Crisis Centers ° °DAY CENTERS °[facility] °M-F 8am-3pm   °[address] [place], [place] [address]   336-332-0824 °Services include: laundry, barbering, support groups, case management, phone  & computer access, showers, AA/NA mtgs, mental health/substance abuse nurse, job skills class, disability information, VA assistance, spiritual classes, etc.  ° °HOMELESS SHELTERS ° °[facility]     °[facility]   °[address], [place]     °[phone]       °       °[facility] (women and children)       °[address] °[place], [place] [address] °336-275-0820 °[email or web address] for application and process °Application Required ° °[facility]   °[address]    °[place] [address]     °[phone]       °             °[facility] °[address] °[place],  [address] °336.273.5572 °336-235-0363(schedule application appt.) °Application Required ° °[facility] (women only)    °[address]     °[place],  [address]     °336-884-1039      °  Intake starts 6pm daily °Need valid ID, SSC, & Police report °[facility] [place] °[address] °[place], [place] °336-881-5420 °Application Required ° °[facility] (men only)     °[address]      °[place], [place]     °[phone]      ° °[facility] °(Pregnant women only) °[address] °[place], [place] °336-275-0206 ° °[facility]      °[address]      °[place], [place] [address]     °336-722-9951      °       °[facility] °[address] °[place], [place] °336-723-1848 °90 day commitment/SA/Application process ° °[facility](men only)     °[address]     °[place], [place]     °[phone]       °Check-in at 7pm     °       °[facility] °[address] °[place], [place] [address] °336-248-6684 °Men/Women/Women and Children must be there by 7 pm ° °[facility] °[place], [place] °336-722-8721                ° °

## 2022-04-13 ENCOUNTER — Encounter (HOSPITAL_COMMUNITY): Payer: Self-pay

## 2022-04-13 ENCOUNTER — Other Ambulatory Visit: Payer: Self-pay

## 2022-04-13 ENCOUNTER — Emergency Department (HOSPITAL_COMMUNITY)
Admission: EM | Admit: 2022-04-13 | Discharge: 2022-04-13 | Disposition: A | Discharge status: Home / Self Care | Payer: Medicaid Other | Attending: Emergency Medicine | Admitting: Emergency Medicine

## 2022-04-13 DIAGNOSIS — F1910 Other psychoactive substance abuse, uncomplicated: Secondary | ICD-10-CM | POA: Insufficient documentation

## 2022-04-13 DIAGNOSIS — R45851 Suicidal ideations: Secondary | ICD-10-CM | POA: Diagnosis not present

## 2022-04-13 DIAGNOSIS — F1494 Cocaine use, unspecified with cocaine-induced mood disorder: Secondary | ICD-10-CM | POA: Diagnosis present

## 2022-04-13 DIAGNOSIS — F1219 Cannabis abuse with unspecified cannabis-induced disorder: Secondary | ICD-10-CM | POA: Diagnosis present

## 2022-04-13 DIAGNOSIS — F1994 Other psychoactive substance use, unspecified with psychoactive substance-induced mood disorder: Secondary | ICD-10-CM | POA: Diagnosis present

## 2022-04-13 LAB — CBC WITH DIFFERENTIAL/PLATELET
Abs Immature Granulocytes: 0.02 10*3/uL (ref 0.00–0.07)
Basophils Absolute: 0.1 10*3/uL (ref 0.0–0.1)
Basophils Relative: 1 %
Eosinophils Absolute: 0.2 10*3/uL (ref 0.0–0.5)
Eosinophils Relative: 2 %
HCT: 49.3 % (ref 39.0–52.0)
Hemoglobin: 15.9 g/dL (ref 13.0–17.0)
Immature Granulocytes: 0 %
Lymphocytes Relative: 27 %
Lymphs Abs: 2.4 10*3/uL (ref 0.7–4.0)
MCH: 30.7 pg (ref 26.0–34.0)
MCHC: 32.3 g/dL (ref 30.0–36.0)
MCV: 95.2 fL (ref 80.0–100.0)
Monocytes Absolute: 0.7 10*3/uL (ref 0.1–1.0)
Monocytes Relative: 8 %
Neutro Abs: 5.7 10*3/uL (ref 1.7–7.7)
Neutrophils Relative %: 62 %
Platelets: 271 10*3/uL (ref 150–400)
RBC: 5.18 MIL/uL (ref 4.22–5.81)
RDW: 12.8 % (ref 11.5–15.5)
WBC: 9.2 10*3/uL (ref 4.0–10.5)
nRBC: 0 % (ref 0.0–0.2)

## 2022-04-13 LAB — COMPREHENSIVE METABOLIC PANEL
ALT: 39 U/L (ref 0–44)
AST: 58 U/L — ABNORMAL HIGH (ref 15–41)
Albumin: 4.5 g/dL (ref 3.5–5.0)
Alkaline Phosphatase: 50 U/L (ref 38–126)
Anion gap: 7 (ref 5–15)
BUN: 20 mg/dL (ref 6–20)
CO2: 26 mmol/L (ref 22–32)
Calcium: 9.1 mg/dL (ref 8.9–10.3)
Chloride: 105 mmol/L (ref 98–111)
Creatinine, Ser: 1.15 mg/dL (ref 0.61–1.24)
GFR, Estimated: >60 mL/min (ref >60)
Glucose, Bld: 82 mg/dL (ref 70–99)
Potassium: 3.7 mmol/L (ref 3.5–5.1)
Sodium: 138 mmol/L (ref 135–145)
Total Bilirubin: 1.1 mg/dL (ref 0.3–1.2)
Total Protein: 7.6 g/dL (ref 6.5–8.1)

## 2022-04-13 LAB — RAPID URINE DRUG SCREEN, HOSP PERFORMED
Amphetamines: NOT DETECTED
Barbiturates: NOT DETECTED
Benzodiazepines: NOT DETECTED
Cocaine: POSITIVE — AB
Opiates: NOT DETECTED
Tetrahydrocannabinol: POSITIVE — AB

## 2022-04-13 LAB — ETHANOL: Alcohol, Ethyl (B): <10 mg/dL (ref <10)

## 2022-04-13 LAB — SALICYLATE LEVEL: Salicylate Lvl: <7 mg/dL — ABNORMAL LOW (ref 7.0–30.0)

## 2022-04-13 LAB — ACETAMINOPHEN LEVEL: Acetaminophen (Tylenol), Serum: <10 ug/mL — ABNORMAL LOW (ref 10–30)

## 2022-04-13 MED ORDER — LORAZEPAM 1 MG PO TABS: 1.0000 mg | ORAL_TABLET | ORAL | Status: DC | PRN

## 2022-04-13 MED ORDER — LORAZEPAM 2 MG/ML IJ SOLN: 1.0000 mg | INTRAMUSCULAR | Status: DC | PRN

## 2022-04-13 NOTE — Discharge Summary (Cosign Needed Addendum)
[facility] Psych ED Discharge  04/13/2022 3:47 PM Bryan Payne  MRN:  [id number]  Principal Problem: Substance induced mood disorder [facility]) Discharge Diagnoses: Principal Problem:   Substance induced mood disorder ([place]) Active Problems:   Cannabis abuse with cannabis-induced disorder ([place])   Cocaine use with cocaine-induced mood disorder ([place])  Clinical Impression:  Final diagnoses:  Suicidal ideation   Subjective: Bryan Payne, 27 years old brought in by EMS after he reported"dropping some Acid and now I Kind of want to hurt myself"  UDS is positive for Cocaine and Cannabis.  On initial attempt to evaluate patient he was sleeping deeply and could not engage in the assessment.  Second attempt he woke up and walked to the conference room.   Patient has hx of Schizophrenia, Depression and Polysubstance abuse., Patient reports he took LSD, Cocaine and Cannabis before calling EMS.  He added that he became suicidal after using the drugs and called EMS.  He admits that he has been dealing with drug addiction for from his Teenage years.  He also reports that he is homeless, unemployed but gets his money for drug by stealing and pan handling.  Patient was supposed to get rehab treatment at [facility] but left after a week treatment and went back to using drugs.  Patient denies SI/HI/AVH.   Collateral information from [facility] [place] [facility] is that they have been looking for patient from the second week in February till now.  He reports that patient is non compliant with medications and keeps going back to using Illicit drug.  Patient lost his housing and his mother does not want him back.  Patient is not taking his medications and he is paranoid that people are after him.  Once he starts getting paranoid he hides from everybody.  Annie Main promises that they will go to the shelter to see patient if they are informed of the shelter patient is going to. Patient is calm, cooperative and again denies  feeling suicidal.  Patient denies hearing voices or seeing things.  Patient will be given information for shelter and encouraged to contact his his ACT Team.  Patient is Psychiatrically cleared. ED Assessment Time Calculation: Start Time: [id number] Stop Time: 1510 Total Time in Minutes (Assessment Completion): 28   Past Psychiatric History: hx of Schizophrenia, Depression and Polysubstance abuse.  Multiple ER Visits and inpatient Psychiatry hospitalizations.  Patient also has been seen few times at [facility]  His care providers are [facility].  They are willing to see him at the shelter.  Past Medical History:  Past Medical History:  Diagnosis Date   Homeless    Knee derangement 04/2013   left   Lateral meniscus tear 04/2013   left knee    Past Surgical History:  Procedure Laterality Date   APPENDECTOMY     KNEE ARTHROSCOPY WITH LATERAL MENISECTOMY Left 05/05/2013   Procedure: LEFT KNEE ARTHROSCOPY WITH LATERAL MENISCAL REPAIR;  Surgeon: Johnny Bridge, MD;  Location: [facility];  Service: Orthopedics;  Laterality: Left;   LAPAROSCOPIC APPENDECTOMY Right 10/13/2012   Procedure: APPENDECTOMY LAPAROSCOPIC;  Surgeon: Edward Jolly, MD;  Location: [facility];  Service: General;  Laterality: Right;   Family History:  Family History  Problem Relation Age of Onset   Kidney failure Father        Renal transplant   Diabetes Other        Family on father's side   Family Psychiatric  History: Unknown Social History:  Social History  Substance and Sexual Activity  Alcohol Use Yes   Comment: "often"     Social History   Substance and Sexual Activity  Drug Use Yes   Types: Marijuana, Methamphetamines, Cocaine   Comment: daily, hallucinagenics    Social History   Socioeconomic History   Marital status: Single    Spouse name: Not on file   Number of children: Not on file   Years of education: Not on file   Highest education level: Not on file  Occupational  History   Not on file  Tobacco Use   Smoking status: Every Day    Packs/day: 0.50    Types: Cigars, Cigarettes    Passive exposure: Current   Smokeless tobacco: Never  Vaping Use   Vaping Use: Never used  Substance and Sexual Activity   Alcohol use: Yes    Comment: "often"   Drug use: Yes    Types: Marijuana, Methamphetamines, Cocaine    Comment: daily, hallucinagenics   Sexual activity: Not Currently  Other Topics Concern   Not on file  Social History Narrative   Not on file   Social Determinants of Health   Financial Resource Strain: Not on file  Food Insecurity: Not on file  Transportation Needs: Not on file  Physical Activity: Not on file  Stress: Not on file  Social Connections: Not on file    Tobacco Cessation:  N/A, patient does not currently use tobacco products  Current Medications: Current Facility-Administered Medications  Medication Dose Route Frequency Provider Last Rate Last Admin   LORazepam (ATIVAN) tablet 1-4 mg  1-4 mg Oral Q1H PRN Deatra Canter, Amjad, PA-C       Or   LORazepam (ATIVAN) injection 1-4 mg  1-4 mg Intravenous Q1H PRN Evlyn Courier, PA-C       Current Outpatient Medications  Medication Sig Dispense Refill   hydrOXYzine (ATARAX) 25 MG tablet Take 1 tablet (25 mg total) by mouth 3 (three) times daily as needed for anxiety. 90 tablet 1   nicotine (NICODERM CQ - DOSED IN MG/24 HOURS) 14 mg/24hr patch Place 1 patch (14 mg total) onto the skin daily. 28 patch 0   risperiDONE (RISPERDAL) 1 MG tablet Take 1 tablet (1 mg total) by mouth daily. 30 tablet 1   sertraline (ZOLOFT) 50 MG tablet Take 1 tablet (50 mg total) by mouth daily. 30 tablet 1   traZODone (DESYREL) 50 MG tablet Take 1 tablet (50 mg total) by mouth at bedtime as needed for sleep. 30 tablet 1   PTA Medications: (Not in a hospital admission)   [place] Scale:  [facility] ED from [facility]13/2022 in [facility] from [facility]10/2022 in [facility]  Emergency Department at [facility] ED from 03/21/2022 in Day Error: Question 1 not populated Error: Q3, 4, or 5 should not be populated when Q2 is No No Risk       Musculoskeletal: Strength & Muscle Tone: within normal limits Gait & Station: normal Patient leans: Front  Psychiatric Specialty Exam: Presentation  General Appearance:  Casual  Eye Contact: Good  Speech: Clear and Coherent; Normal Rate  Speech Volume: Normal  Handedness: Right   Mood and Affect  Mood: Euthymic  Affect: Congruent   Thought Process  Thought Processes: Coherent; Goal Directed; Linear  Descriptions of Associations:Intact  Orientation:Full (Time, Place and Person)  Thought Content:Logical  History of Schizophrenia/Schizoaffective disorder:No  Duration of Psychotic Symptoms:N/A  Hallucinations:Hallucinations: None  Ideas of Reference:None  Suicidal Thoughts:Suicidal Thoughts: No  Homicidal Thoughts:Homicidal Thoughts: No   Sensorium  Memory: Immediate Good; Recent Good; Remote Good  Judgment: Intact  Insight: Present   Executive Functions  Concentration: Good  Attention Span: Good  Recall: Good  Fund of Knowledge: Good  Language: Good   Psychomotor Activity  Psychomotor Activity: Psychomotor Activity: Normal   Assets  Assets: Communication Skills   Sleep  Sleep: Sleep: Good    Physical Exam: Physical Exam Vitals and nursing note reviewed.  Constitutional:      Appearance: Normal appearance.  HENT:     Head: Normocephalic and atraumatic.     Nose: Nose normal.  Cardiovascular:     Rate and Rhythm: Normal rate and regular rhythm.  Pulmonary:     Effort: Pulmonary effort is normal.  Musculoskeletal:        General: Normal range of motion.  Skin:    General: Skin is warm and dry.  Neurological:     General: No focal deficit present.     Mental Status: He is  alert and oriented to person, place, and time.    Review of Systems  Constitutional: Negative.   HENT: Negative.    Eyes: Negative.   Respiratory: Negative.    Cardiovascular: Negative.   Gastrointestinal: Negative.   Genitourinary: Negative.   Musculoskeletal: Negative.   Skin: Negative.   Neurological: Negative.   Endo/Heme/Allergies: Negative.   Psychiatric/Behavioral:  Positive for substance abuse. The patient is nervous/anxious.    Blood pressure (!) 109/56, pulse 63, temperature 97.8 F (36.6 C), temperature source Oral, resp. rate 18, height '5\' 7"'$  (1.702 m), weight 65.8 kg, SpO2 100 %. Body mass index is 22.72 kg/m.   Demographic Factors:  Payne, Adolescent or young adult, Low socioeconomic status, and Unemployed  Loss Factors: Decline in physical health and Financial problems/change in socioeconomic status  Historical Factors: NA  Risk Reduction Factors:   Positive social support and Engaged with [facility] ACT Team  Continued Clinical Symptoms:  Alcohol/Substance Abuse/Dependencies Schizophrenia:   Depressive state  Cognitive Features That Contribute To Risk:  None    Suicide Risk:  Minimal: No identifiable suicidal ideation.  Patients presenting with no risk factors but with morbid ruminations; may be classified as minimal risk based on the severity of the depressive symptoms    Plan Of Care/Follow-up recommendations:  Activity:  as tolerated Diet:  Regular  Medical Decision Making: Patient denies SI/HI/AVH.  He plans to go to any available Shelter and his ACT Team will be seeing him there until they can arrange housing for him.  We reviewed safety plan-call 911 or 988 for Mental health crisis including but not limited to suicide ideation.  Contact your ACT team or go to [place].  Problem 1: Substance induced mood disorder  Problem 2: Cocaine use use with Cocaine induced mood disorder  Problem 3: Cannabis  abuse with Cannabis induced mood disorder.  Disposition: Psychiatrically cleared.  Delfin Gant, NP-PMHNP-BC 04/13/2022, 3:47 PM

## 2022-04-13 NOTE — ED Provider Notes (Cosign Needed Addendum)
[place] EMERGENCY DEPARTMENT AT [facility] Provider Note   CSN: [id number] Arrival date & time: 04/13/22  [id number]     History  Chief Complaint  Patient presents with   Psychiatric Evaluation    Bryan Payne is a 27 y.o. male.  27 year old male presents following drug abuse with suicidal ideations.  Does not engage in interview.  Denies pain anywhere.  When asked if he has thoughts about harming himself he says "yes".  Does not elaborate.  Denies AVH.  States he last used acid this morning.  The history is provided by the patient. No language interpreter was used.       Home Medications Prior to Admission medications   Medication Sig Start Date End Date Taking? Authorizing Provider  hydrOXYzine (ATARAX) 25 MG tablet Take 1 tablet (25 mg total) by mouth 3 (three) times daily as needed for anxiety. 03/11/22   Revonda Humphrey, NP  nicotine (NICODERM CQ - DOSED IN MG/24 HOURS) 14 mg/24hr patch Place 1 patch (14 mg total) onto the skin daily. 03/22/22   White, Patrice L, NP  risperiDONE (RISPERDAL) 1 MG tablet Take 1 tablet (1 mg total) by mouth daily. 03/12/22   Revonda Humphrey, NP  sertraline (ZOLOFT) 50 MG tablet Take 1 tablet (50 mg total) by mouth daily. 03/11/22   Revonda Humphrey, NP  traZODone (DESYREL) 50 MG tablet Take 1 tablet (50 mg total) by mouth at bedtime as needed for sleep. 03/11/22   Revonda Humphrey, NP      Allergies    Vicks vaporub [camph-eucalypt-men-turp-pet]    Review of Systems   Review of Systems  Respiratory:  Negative for shortness of breath.   Cardiovascular:  Negative for chest pain.  Psychiatric/Behavioral:  Positive for suicidal ideas.   All other systems reviewed and are negative.   Physical Exam Updated Vital Signs BP (!) 109/56   Pulse 63   Temp 97.8 F (36.6 C) (Oral)   Resp 18   Ht '5\' 7"'$  (1.702 m)   Wt 65.8 kg   SpO2 100%   BMI 22.72 kg/m  Physical Exam Vitals and nursing note reviewed.  Constitutional:       General: He is not in acute distress.    Appearance: Normal appearance. He is not ill-appearing.  HENT:     Head: Normocephalic and atraumatic.     Nose: Nose normal.  Eyes:     Conjunctiva/sclera: Conjunctivae normal.  Pulmonary:     Effort: Pulmonary effort is normal. No respiratory distress.  Musculoskeletal:        General: No deformity.  Skin:    Findings: No rash.  Neurological:     Mental Status: He is alert.  Psychiatric:        Thought Content: Thought content includes suicidal ideation.     ED Results / Procedures / Treatments   Labs (all labs ordered are listed, but only abnormal results are displayed) Labs Reviewed  ACETAMINOPHEN LEVEL  CBC WITH DIFFERENTIAL/PLATELET  COMPREHENSIVE METABOLIC PANEL  SALICYLATE LEVEL  RAPID URINE DRUG SCREEN, HOSP PERFORMED  ETHANOL    EKG None  Radiology No results found.  Procedures Procedures    Medications Ordered in ED Medications - No data to display  ED Course/ Medical Decision Making/ A&P                             Medical Decision Making Amount and/or Complexity of  Data Reviewed Labs: ordered.  Risk Prescription drug management.   27 year old male presents to the for evaluation of suicidal ideation.  Endorses history of polysubstance abuse.  Including alcohol abuse.  States his last drink might have been this morning or last night.  He is unsure.  Will place on CIWA protocol.  Will obtain psych clearance labs.  CBC is unremarkable, CMP unremarkable.  UDS positive for cocaine and THC.  Ethanol, salicylate, acetaminophen levels within normal limits.  He denies pain.  He was recently seen for SI as well.  Patient medically cleared for psych eval.  Will place TTS consult.  Appears he has not taken his home medications in some time.  Will defer to ordering any of these until he is seen by TTS.  Signed out to default provider at the end of my shift.   Final Clinical Impression(s) / ED Diagnoses Final  diagnoses:  Suicidal ideation    Rx / DC Orders ED Discharge Orders     None         Evlyn Courier, PA-C 04/13/22 1501    Evlyn Courier, PA-C 04/13/22 1518    Dorie Rank, MD 04/15/22 1157

## 2022-04-13 NOTE — Progress Notes (Addendum)
TOC consulted for SA resources. It appears TTS is consulted to see the pt. TOC will reengage if/when pt is cleared from TTS' care. No further TOC needs at this time.

## 2022-04-13 NOTE — ED Triage Notes (Signed)
Patient brought in by EMS due to patient reporting "dropping some acid and now I kind of want to hurt myself." Pt drowsy upon this nurses assessment.

## 2022-04-13 NOTE — ED Provider Notes (Signed)
Emergency Medicine Observation Re-evaluation Note  Bryan Payne is a 26 y.o. male, seen on rounds today.  Pt initially presented to the ED for complaints of Psychiatric Evaluation Currently, the patient is resting comfortably.  Physical Exam  BP (!) 109/56   Pulse 63   Temp 97.8 F (36.6 C) (Oral)   Resp 18   Ht '5\' 7"'$  (1.702 m)   Wt 65.8 kg   SpO2 100%   BMI 22.72 kg/m  Physical Exam Vitals and nursing note reviewed.  Constitutional:      General: He is not in acute distress.    Appearance: He is well-developed.  HENT:     Head: Normocephalic and atraumatic.  Eyes:     Conjunctiva/sclera: Conjunctivae normal.  Cardiovascular:     Rate and Rhythm: Normal rate and regular rhythm.     Heart sounds: No murmur heard. Pulmonary:     Effort: Pulmonary effort is normal. No respiratory distress.  Musculoskeletal:        General: No swelling.     Cervical back: Neck supple.  Skin:    General: Skin is warm and dry.  Neurological:     Mental Status: He is alert.  Psychiatric:        Mood and Affect: Mood normal.      ED Course / MDM  EKG:EKG Interpretation  Date/Time:  Monday April 13 2022 10:03:42 EST Ventricular Rate:  69 PR Interval:  180 QRS Duration: 92 QT Interval:  454 QTC Calculation: 486 R Axis:   91 Text Interpretation: Normal sinus rhythm with sinus arrhythmia Rightward axis Prolonged QT Abnormal ECG When compared with ECG of 20-Mar-2022 22:10,  qt increased Confirmed by Dorie Rank [phone]) on 04/13/2022 10:28:32 AM  I have reviewed the labs performed to date as well as medications administered while in observation.  Recent changes in the last 24 hours include evaluation by TTS who is psychiatrically cleared the patient.  Plan  Current plan is for discharge.    Teressa Lower, MD 04/13/22 [phone]

## 2022-04-13 NOTE — Group Note (Unsigned)
Date:  04/13/2022 Time:  10:23 AM  Group Topic/Focus:  Goals Group:   The focus of this group is to help patients establish daily goals to achieve during treatment and discuss how the patient can incorporate goal setting into their daily lives to aide in recovery. Orientation:   The focus of this group is to educate the patient on the purpose and policies of crisis stabilization and provide a format to answer questions about their admission.  The group details unit policies and expectations of patients while admitted.     Participation Level:  {BHH PARTICIPATION WO:[id number]  Participation Quality:  {BHH PARTICIPATION QUALITY:22265}  Affect:  {BHH AFFECT:22266}  Cognitive:  {BHH COGNITIVE:22267}  Insight: {BHH Insight2:20797}  Engagement in Group:  {BHH ENGAGEMENT IN BP:[id number]  Modes of Intervention:  {BHH MODES OF INTERVENTION:22269}  Additional Comments:  ***  Dub Mikes 04/13/2022, 10:23 AM

## 2022-04-29 ENCOUNTER — Emergency Department (HOSPITAL_COMMUNITY): Payer: Medicaid Other

## 2022-04-29 ENCOUNTER — Emergency Department (HOSPITAL_COMMUNITY)
Admission: EM | Admit: 2022-04-29 | Discharge: 2022-04-30 | Disposition: A | Discharge status: Other Acute Inpatient Hospital | Payer: Medicaid Other | Attending: Emergency Medicine | Admitting: Emergency Medicine

## 2022-04-29 ENCOUNTER — Other Ambulatory Visit: Payer: Self-pay

## 2022-04-29 DIAGNOSIS — F1994 Other psychoactive substance use, unspecified with psychoactive substance-induced mood disorder: Secondary | ICD-10-CM | POA: Diagnosis present

## 2022-04-29 DIAGNOSIS — F209 Schizophrenia, unspecified: Secondary | ICD-10-CM | POA: Diagnosis not present

## 2022-04-29 DIAGNOSIS — S61011A Laceration without foreign body of right thumb without damage to nail, initial encounter: Secondary | ICD-10-CM | POA: Insufficient documentation

## 2022-04-29 DIAGNOSIS — W25XXXA Contact with sharp glass, initial encounter: Secondary | ICD-10-CM | POA: Insufficient documentation

## 2022-04-29 DIAGNOSIS — Z1152 Encounter for screening for COVID-19: Secondary | ICD-10-CM | POA: Diagnosis not present

## 2022-04-29 DIAGNOSIS — R45851 Suicidal ideations: Secondary | ICD-10-CM

## 2022-04-29 MED ORDER — BUPIVACAINE HCL (PF) 0.5 % IJ SOLN
10.0000 mL | Freq: Once | INTRAMUSCULAR | Status: AC
  Administered 2022-04-30: 10 mL
  Filled 2022-04-29: qty 30

## 2022-04-29 NOTE — ED Triage Notes (Addendum)
Pt presents with laceration to right hand. States he cut it with glass. Unknown last tetanus

## 2022-04-29 NOTE — ED Provider Notes (Signed)
[place] AT [facility] Provider Note   CSN: [id number] Arrival date & time: 04/29/22  2143     History {Add pertinent medical, surgical, social history, OB history to HPI:1} Chief Complaint  Patient presents with   Laceration    R Hand    Bryan Payne is a 27 y.o. male.   Laceration    This is a 27 year old male with medical history of polysubstance use disorder, adjustment disorder, tobacco dependence presenting to the emergency department due to SI and laceration.  Patient punched a window, right thumb with glass on the dorsum.  He is able to flex and extend although with significant pain.  Denies any paresthesias, unsure his last tetanus but per chart review it was in 2019.  Not on blood thinners.  No pain to the wrist, denies any contact with human mouth.  Endorses alcohol use prior to arrival, also smokes cocaine.  No IV drug use.  Denies any history of alcohol withdrawal, seizures, vomiting.  Patient also states he is feeling suicidal with plan to overdose.  Also having HI, states he would like to "get somebody else to put him out of his misery".  When asked if he has any specific person he feels aggressive toward he is unable to answer.  Home Medications Prior to Admission medications   Medication Sig Start Date End Date Taking? Authorizing Provider  hydrOXYzine (ATARAX) 25 MG tablet Take 1 tablet (25 mg total) by mouth 3 (three) times daily as needed for anxiety. 03/11/22   Revonda Humphrey, NP  nicotine (NICODERM CQ - DOSED IN MG/24 HOURS) 14 mg/24hr patch Place 1 patch (14 mg total) onto the skin daily. 03/22/22   White, Patrice L, NP  risperiDONE (RISPERDAL) 1 MG tablet Take 1 tablet (1 mg total) by mouth daily. 03/12/22   Revonda Humphrey, NP  sertraline (ZOLOFT) 50 MG tablet Take 1 tablet (50 mg total) by mouth daily. 03/11/22   Revonda Humphrey, NP  traZODone (DESYREL) 50 MG tablet Take 1 tablet (50 mg total) by mouth at bedtime  as needed for sleep. 03/11/22   Revonda Humphrey, NP      Allergies    Vicks vaporub [camph-eucalypt-men-turp-pet]    Review of Systems   Review of Systems  Physical Exam Updated Vital Signs BP 134/77   Pulse 87   Temp 98.1 F (36.7 C) (Oral)   Resp 18   SpO2 96%  Physical Exam Vitals and nursing note reviewed. Exam conducted with a chaperone present.  Constitutional:      Appearance: Normal appearance.     Comments: Disheveled, slurring words but follows commands, oriented  HENT:     Head: Normocephalic and atraumatic.  Eyes:     General: No scleral icterus.       Right eye: No discharge.        Left eye: No discharge.     Extraocular Movements: Extraocular movements intact.     Pupils: Pupils are equal, round, and reactive to light.  Cardiovascular:     Rate and Rhythm: Normal rate and regular rhythm.     Pulses: Normal pulses.     Heart sounds: Normal heart sounds.     No friction rub. No gallop.  Pulmonary:     Effort: Pulmonary effort is normal. No respiratory distress.     Breath sounds: Normal breath sounds.  Abdominal:     General: Abdomen is flat. Bowel sounds are normal. There is no  distension.     Palpations: Abdomen is soft.     Tenderness: There is no abdominal tenderness.  Musculoskeletal:     Comments: Able to flex and extend right thumb, can make a fist, tolerates range to right wrist without difficulty  Skin:    General: Skin is warm and dry.     Coloration: Skin is not jaundiced.     Comments: 2 cm laceration dorsum right thumb, superficial lacerations-stages of healing anterior forearms  Neurological:     Mental Status: He is alert and oriented to person, place, and time.     Coordination: Coordination normal.     Comments: No tremors, no asterixis  Psychiatric:     Comments: Suicidal affect     ED Results / Procedures / Treatments   Labs (all labs ordered are listed, but only abnormal results are displayed) Labs Reviewed - No data to  display  EKG None  Radiology DG Hand Complete Right  Result Date: 04/29/2022 CLINICAL DATA:  Trauma, laceration to the right hand. Cut with glass. EXAM: RIGHT HAND - COMPLETE 3+ VIEW COMPARISON:  None Available. FINDINGS: There is no evidence of fracture or dislocation. There is no evidence of arthropathy or other focal bone abnormality. Soft tissues swelling about the first web space. No radiopaque foreign body identified. IMPRESSION: 1. Soft tissue swelling about the first web space. No radiopaque foreign body identified. 2. No acute osseous abnormality. Electronically Signed   By: Keane Police D.O.   On: 04/29/2022 22:16    Procedures Procedures  {Document cardiac monitor, telemetry assessment procedure when appropriate:1}  Medications Ordered in ED Medications  bupivacaine(PF) (MARCAINE) 0.5 % injection 10 mL (has no administration in time range)    ED Course/ Medical Decision Making/ A&P   {   Click here for ABCD2, HEART and other calculatorsREFRESH Note before signing :1}                          Medical Decision Making Amount and/or Complexity of Data Reviewed Labs: ordered. Radiology: ordered.  Risk Prescription drug management.   ***  {Document critical care time when appropriate:1} {Document review of labs and clinical decision tools ie heart score, Chads2Vasc2 etc:1}  {Document your independent review of radiology images, and any outside records:1} {Document your discussion with family members, caretakers, and with consultants:1} {Document social determinants of health affecting pt's care:1} {Document your decision making why or why not admission, treatments were needed:1} Final Clinical Impression(s) / ED Diagnoses Final diagnoses:  None    Rx / DC Orders ED Discharge Orders     None

## 2022-04-30 ENCOUNTER — Encounter (HOSPITAL_COMMUNITY): Payer: Self-pay | Admitting: Behavioral Health

## 2022-04-30 ENCOUNTER — Ambulatory Visit (HOSPITAL_COMMUNITY)
Admission: EM | Admit: 2022-04-30 | Discharge: 2022-05-01 | Disposition: A | Discharge status: Home / Self Care | Payer: Medicaid Other | Attending: Behavioral Health | Admitting: Behavioral Health

## 2022-04-30 DIAGNOSIS — F209 Schizophrenia, unspecified: Secondary | ICD-10-CM | POA: Insufficient documentation

## 2022-04-30 DIAGNOSIS — Z560 Unemployment, unspecified: Secondary | ICD-10-CM | POA: Insufficient documentation

## 2022-04-30 DIAGNOSIS — Z9152 Personal history of nonsuicidal self-harm: Secondary | ICD-10-CM | POA: Insufficient documentation

## 2022-04-30 DIAGNOSIS — Z79899 Other long term (current) drug therapy: Secondary | ICD-10-CM | POA: Insufficient documentation

## 2022-04-30 DIAGNOSIS — F332 Major depressive disorder, recurrent severe without psychotic features: Secondary | ICD-10-CM | POA: Diagnosis present

## 2022-04-30 DIAGNOSIS — F17210 Nicotine dependence, cigarettes, uncomplicated: Secondary | ICD-10-CM | POA: Insufficient documentation

## 2022-04-30 DIAGNOSIS — S61411D Laceration without foreign body of right hand, subsequent encounter: Secondary | ICD-10-CM | POA: Insufficient documentation

## 2022-04-30 DIAGNOSIS — F1994 Other psychoactive substance use, unspecified with psychoactive substance-induced mood disorder: Secondary | ICD-10-CM

## 2022-04-30 DIAGNOSIS — Z5900 Homelessness unspecified: Secondary | ICD-10-CM | POA: Insufficient documentation

## 2022-04-30 DIAGNOSIS — F4325 Adjustment disorder with mixed disturbance of emotions and conduct: Secondary | ICD-10-CM | POA: Insufficient documentation

## 2022-04-30 DIAGNOSIS — F329 Major depressive disorder, single episode, unspecified: Secondary | ICD-10-CM | POA: Insufficient documentation

## 2022-04-30 DIAGNOSIS — F1494 Cocaine use, unspecified with cocaine-induced mood disorder: Secondary | ICD-10-CM | POA: Diagnosis present

## 2022-04-30 DIAGNOSIS — Z1152 Encounter for screening for COVID-19: Secondary | ICD-10-CM | POA: Insufficient documentation

## 2022-04-30 LAB — CBC WITH DIFFERENTIAL/PLATELET
Abs Immature Granulocytes: 0.02 10*3/uL (ref 0.00–0.07)
Basophils Absolute: 0.1 10*3/uL (ref 0.0–0.1)
Basophils Relative: 1 %
Eosinophils Absolute: 0.3 10*3/uL (ref 0.0–0.5)
Eosinophils Relative: 3 %
HCT: 41.6 % (ref 39.0–52.0)
Hemoglobin: 13.8 g/dL (ref 13.0–17.0)
Immature Granulocytes: 0 %
Lymphocytes Relative: 31 %
Lymphs Abs: 2.7 10*3/uL (ref 0.7–4.0)
MCH: 30.9 pg (ref 26.0–34.0)
MCHC: 33.2 g/dL (ref 30.0–36.0)
MCV: 93.3 fL (ref 80.0–100.0)
Monocytes Absolute: 0.7 10*3/uL (ref 0.1–1.0)
Monocytes Relative: 8 %
Neutro Abs: 4.8 10*3/uL (ref 1.7–7.7)
Neutrophils Relative %: 57 %
Platelets: 286 10*3/uL (ref 150–400)
RBC: 4.46 MIL/uL (ref 4.22–5.81)
RDW: 12.7 % (ref 11.5–15.5)
WBC: 8.6 10*3/uL (ref 4.0–10.5)
nRBC: 0 % (ref 0.0–0.2)

## 2022-04-30 LAB — RAPID URINE DRUG SCREEN, HOSP PERFORMED
Amphetamines: NOT DETECTED
Barbiturates: NOT DETECTED
Benzodiazepines: NOT DETECTED
Cocaine: POSITIVE — AB
Opiates: NOT DETECTED
Tetrahydrocannabinol: POSITIVE — AB

## 2022-04-30 LAB — COMPREHENSIVE METABOLIC PANEL
ALT: 38 U/L (ref 0–44)
AST: 47 U/L — ABNORMAL HIGH (ref 15–41)
Albumin: 4 g/dL (ref 3.5–5.0)
Alkaline Phosphatase: 40 U/L (ref 38–126)
Anion gap: 9 (ref 5–15)
BUN: 14 mg/dL (ref 6–20)
CO2: 25 mmol/L (ref 22–32)
Calcium: 8.7 mg/dL — ABNORMAL LOW (ref 8.9–10.3)
Chloride: 104 mmol/L (ref 98–111)
Creatinine, Ser: 0.96 mg/dL (ref 0.61–1.24)
GFR, Estimated: >60 mL/min (ref >60)
Glucose, Bld: 75 mg/dL (ref 70–99)
Potassium: 3.3 mmol/L — ABNORMAL LOW (ref 3.5–5.1)
Sodium: 138 mmol/L (ref 135–145)
Total Bilirubin: 1.2 mg/dL (ref 0.3–1.2)
Total Protein: 6.4 g/dL — ABNORMAL LOW (ref 6.5–8.1)

## 2022-04-30 LAB — RESP PANEL BY RT-PCR (RSV, FLU A&B, COVID)  RVPGX2
Influenza A by PCR: NEGATIVE
Influenza B by PCR: NEGATIVE
Resp Syncytial Virus by PCR: NEGATIVE
SARS Coronavirus 2 by RT PCR: NEGATIVE

## 2022-04-30 LAB — ETHANOL: Alcohol, Ethyl (B): <10 mg/dL (ref <10)

## 2022-04-30 MED ORDER — TRAZODONE HCL 50 MG PO TABS
50.0000 mg | ORAL_TABLET | Freq: Every evening | ORAL | Status: DC | PRN
  Administered 2022-04-30: 50 mg via ORAL
  Filled 2022-04-30: qty 1

## 2022-04-30 MED ORDER — SERTRALINE HCL 50 MG PO TABS
50.0000 mg | ORAL_TABLET | Freq: Once | ORAL | Status: AC
  Administered 2022-04-30: 50 mg via ORAL
  Filled 2022-04-30: qty 1

## 2022-04-30 MED ORDER — POTASSIUM CHLORIDE CRYS ER 20 MEQ PO TBCR
20.0000 meq | EXTENDED_RELEASE_TABLET | Freq: Once | ORAL | Status: AC
  Administered 2022-04-30: 20 meq via ORAL
  Filled 2022-04-30: qty 1

## 2022-04-30 MED ORDER — HYDROXYZINE HCL 25 MG PO TABS
25.0000 mg | ORAL_TABLET | Freq: Once | ORAL | Status: AC
  Administered 2022-04-30: 25 mg via ORAL
  Filled 2022-04-30: qty 1

## 2022-04-30 MED ORDER — ACETAMINOPHEN 325 MG PO TABS
650.0000 mg | ORAL_TABLET | Freq: Once | ORAL | Status: AC
  Administered 2022-04-30: 650 mg via ORAL
  Filled 2022-04-30: qty 2

## 2022-04-30 MED ORDER — HYDROXYZINE HCL 25 MG PO TABS
25.0000 mg | ORAL_TABLET | Freq: Three times a day (TID) | ORAL | Status: DC | PRN
  Administered 2022-04-30: 25 mg via ORAL

## 2022-04-30 MED ORDER — RISPERIDONE 1 MG PO TABS
1.0000 mg | ORAL_TABLET | Freq: Once | ORAL | Status: AC
  Administered 2022-04-30: 1 mg via ORAL
  Filled 2022-04-30: qty 2

## 2022-04-30 MED ORDER — RISPERIDONE 1 MG PO TABS
1.0000 mg | ORAL_TABLET | Freq: Every day | ORAL | Status: DC
  Administered 2022-05-01: 1 mg via ORAL
  Filled 2022-04-30: qty 7
  Filled 2022-04-30: qty 1

## 2022-04-30 MED ORDER — SERTRALINE HCL 50 MG PO TABS
50.0000 mg | ORAL_TABLET | Freq: Every day | ORAL | Status: DC
  Administered 2022-05-01: 50 mg via ORAL
  Filled 2022-04-30: qty 7
  Filled 2022-04-30: qty 1

## 2022-04-30 NOTE — ED Notes (Signed)
Patient was provided with a meal and juice

## 2022-04-30 NOTE — ED Notes (Signed)
Pt sleeping@this  time. Breathing even and unlabored. Will continue to monitor for safey

## 2022-04-30 NOTE — ED Notes (Signed)
Patient to room  29. Patient ambulated to room.  Patient oriented to unit and room. Patient calm and cooperative.

## 2022-04-30 NOTE — ED Notes (Signed)
Pt was searched and skin assessment completed.  Pt was a direct admit . He was evaluated by Estill Bamberg NP and pt was brought onto the unit and assigned bed 4.  Pt is awake and alert. He is oriented. Has a flat affect and congruent mood. Pt asked for something to eat and was given a salad.

## 2022-04-30 NOTE — Discharge Instructions (Signed)
Have the sutures removed from your thumb in 7 to 10 days.

## 2022-04-30 NOTE — Consult Note (Signed)
[place] ED ASSESSMENT   Reason for Consult:  Psych Consult Referring Physician:  Sherrill Raring, PA-C Patient Identification: Bryan Payne MRN:  [id number] ED Chief Complaint: Substance induced mood disorder ([place])  Diagnosis:  Principal Problem:   Substance induced mood disorder ([place]) Active Problems:   Schizophrenia [facility])   ED Assessment Time Calculation: Start Time: 1520 Stop Time: 1600 Total Time in Minutes (Assessment Completion): 40    HPI:  Per Triage Note: Pt presents with laceration to right hand. States he cut it with glass. Unknown last tetanus.    Subjective:  Bryan Payne, 27 y.o., male patient seen face to face by this provider, consulted with Dr. Dwyane Dee; and chart reviewed on 04/30/22.  On evaluation Bryan Payne reports that he has been sleeping outside for the past week, he has not been able to get a refill on his Risperdal.  Patient states that due to aggravation he punched a window, and has been cutting himself intentionally on the left forearm over the last week, due to wanting to feel pain.  Patient states that he uses a "little" cocaine and alcohol, not able to tell provider how much or how often.  Patient denies SI/HI/AVH, but still says he wants to feel pain, but does not go into detail. Pt presents with laceration to right hand. Patient says to provider "I feel like I need to be admitted to an inpatient phychiatric facility, and then changed his mind and says "I am ready to go ".  Patient states that he was diagnosed with schizophrenia about 6 months ago, the only medication he states he takes is Risperdal.  During evaluation Bryan Payne is laying in bed in no acute distress.  He is alert, oriented x 4, calm, cooperative and attentive.  His mood is anxious with congruent affect.  He has normal speech, and behavior.  Objectively there is no evidence of psychosis/mania or delusional thinking.  Patient is able to converse coherently, no distractibility, or  pre-occupation.  He denies suicidal/self-harm with some hesitancy.  Denies homicidal ideation, psychosis, and paranoia.  Patient answered question appropriately.  Patient agreed to go to [facility] for observation, and to be provided with community resources, and medication management.   Past Psychiatric History: Schizophrenia  Risk to Self or Others: Is the patient at risk to self? Patient is unsure Has the patient been a risk to self in the past 6 months? denies Has the patient been a risk to self within the distant past? denies Is the patient a risk to others? denies Has the patient been a risk to others in the past 6 months? denies Has the patient been a risk to others within the distant past? denies  [place] Scale:  [place] ED from 04/29/2022 in [facility] Emergency Department at [facility] ED from 04/13/2022 in [facility] Emergency Department at [facility] ED from 04/10/2022 in [facility] Emergency Department at [place] Moderate Risk Error: Question 1 not populated Error: Q3, 4, or 5 should not be populated when Q2 is No       AIMS:  , , ,  ,   ASAM:    Substance Abuse:  Alcohol / Drug Use Pain Medications: SEE MAR Prescriptions: SEE MAR Over the Counter: SEE MAR History of alcohol / drug use?: Yes Longest period of sobriety (when/how long): 13 days in Rehab in February '24 Negative Consequences of Use: Personal relationships, Financial Withdrawal Symptoms: Aggressive/Assaultive, Patient  aware of relationship between substance abuse and physical/medical complications  Past Medical History:  Past Medical History:  Diagnosis Date   Homeless    Knee derangement 04/2013   left   Lateral meniscus tear 04/2013   left knee    Past Surgical History:  Procedure Laterality Date   APPENDECTOMY     KNEE ARTHROSCOPY WITH LATERAL MENISECTOMY Left 05/05/2013   Procedure: LEFT KNEE ARTHROSCOPY WITH LATERAL MENISCAL REPAIR;   Surgeon: Johnny Bridge, MD;  Location: [place];  Service: Orthopedics;  Laterality: Left;   LAPAROSCOPIC APPENDECTOMY Right 10/13/2012   Procedure: APPENDECTOMY LAPAROSCOPIC;  Surgeon: Edward Jolly, MD;  Location: [facility];  Service: General;  Laterality: Right;   Family History:  Family History  Problem Relation Age of Onset   Kidney failure Father        Renal transplant   Diabetes Other        Family on father's side    Social History:  Social History   Substance and Sexual Activity  Alcohol Use Yes   Comment: "often"     Social History   Substance and Sexual Activity  Drug Use Yes   Types: Marijuana, Methamphetamines, Cocaine   Comment: daily, hallucinagenics    Social History   Socioeconomic History   Marital status: Single    Spouse name: Not on file   Number of children: Not on file   Years of education: Not on file   Highest education level: Not on file  Occupational History   Not on file  Tobacco Use   Smoking status: Every Day    Packs/day: .5    Types: Cigars, Cigarettes    Passive exposure: Current   Smokeless tobacco: Never  Vaping Use   Vaping Use: Never used  Substance and Sexual Activity   Alcohol use: Yes    Comment: "often"   Drug use: Yes    Types: Marijuana, Methamphetamines, Cocaine    Comment: daily, hallucinagenics   Sexual activity: Not Currently  Other Topics Concern   Not on file  Social History Narrative   Not on file   Social Determinants of Health   Financial Resource Strain: Not on file  Food Insecurity: Not on file  Transportation Needs: Not on file  Physical Activity: Not on file  Stress: Not on file  Social Connections: Not on file   Additional Social History:    Allergies:   Allergies  Allergen Reactions   Vicks Vaporub [Camph-Eucalypt-Men-Turp-Pet] Other (See Comments)    Caused eye irritation and shortness of breath    Labs:  Results for orders placed or performed during the  hospital encounter of 04/29/22 (from the past 48 hour(s))  Urine rapid drug screen (hosp performed)     Status: Abnormal   Collection Time: 04/29/22  3:43 AM  Result Value Ref Range   Opiates NONE DETECTED NONE DETECTED   Cocaine POSITIVE (A) NONE DETECTED   Benzodiazepines NONE DETECTED NONE DETECTED   Amphetamines NONE DETECTED NONE DETECTED   Tetrahydrocannabinol POSITIVE (A) NONE DETECTED   Barbiturates NONE DETECTED NONE DETECTED    Comment: (NOTE) DRUG SCREEN FOR MEDICAL PURPOSES ONLY.  IF CONFIRMATION IS NEEDED FOR ANY PURPOSE, NOTIFY LAB WITHIN 5 DAYS.  LOWEST DETECTABLE LIMITS FOR URINE DRUG SCREEN Drug Class                     Cutoff (ng/mL) Amphetamine and metabolites    1000 Barbiturate and metabolites  200 Benzodiazepine                 200 Opiates and metabolites        300 Cocaine and metabolites        300 THC                            50 Performed at [facility] [address] [place], [place] [address]   Comprehensive metabolic panel     Status: Abnormal   Collection Time: 04/29/22 11:54 PM  Result Value Ref Range   Sodium 138 135 - 145 mmol/L   Potassium 3.3 (L) 3.5 - 5.1 mmol/L   Chloride 104 98 - 111 mmol/L   CO2 25 22 - 32 mmol/L   Glucose, Bld 75 70 - 99 mg/dL    Comment: Glucose reference range applies only to samples taken after fasting for at least 8 hours.   BUN 14 6 - 20 mg/dL   Creatinine, Ser 0.96 0.61 - 1.24 mg/dL   Calcium 8.7 (L) 8.9 - 10.3 mg/dL   Total Protein 6.4 (L) 6.5 - 8.1 g/dL   Albumin 4.0 3.5 - 5.0 g/dL   AST 47 (H) 15 - 41 U/L   ALT 38 0 - 44 U/L   Alkaline Phosphatase 40 38 - 126 U/L   Total Bilirubin 1.2 0.3 - 1.2 mg/dL   GFR, Estimated >60 >60 mL/min    Comment: (NOTE) Calculated using the CKD-EPI Creatinine Equation (2021)    Anion gap 9 5 - 15    Comment: Performed at [facility], [place] [address], [place], [place] [address]  Ethanol     Status: None   Collection Time:  04/29/22 11:54 PM  Result Value Ref Range   Alcohol, Ethyl (B) <10 <10 mg/dL    Comment: (NOTE) Lowest detectable limit for serum alcohol is 10 mg/dL.  For medical purposes only. Performed at [facility], [place] [address] [place], [place] [address]   CBC with Diff     Status: None   Collection Time: 04/29/22 11:54 PM  Result Value Ref Range   WBC 8.6 4.0 - 10.5 K/uL   RBC 4.46 4.22 - 5.81 MIL/uL   Hemoglobin 13.8 13.0 - 17.0 g/dL   HCT 41.6 39.0 - 52.0 %   MCV 93.3 80.0 - 100.0 fL   MCH 30.9 26.0 - 34.0 pg   MCHC 33.2 30.0 - 36.0 g/dL   RDW 12.7 11.5 - 15.5 %   Platelets 286 150 - 400 K/uL   nRBC 0.0 0.0 - 0.2 %   Neutrophils Relative % 57 %   Neutro Abs 4.8 1.7 - 7.7 K/uL   Lymphocytes Relative 31 %   Lymphs Abs 2.7 0.7 - 4.0 K/uL   Monocytes Relative 8 %   Monocytes Absolute 0.7 0.1 - 1.0 K/uL   Eosinophils Relative 3 %   Eosinophils Absolute 0.3 0.0 - 0.5 K/uL   Basophils Relative 1 %   Basophils Absolute 0.1 0.0 - 0.1 K/uL   Immature Granulocytes 0 %   Abs Immature Granulocytes 0.02 0.00 - 0.07 K/uL    Comment: Performed at [facility], [place] [address] [place], [place] [address]    No current facility-administered medications for this encounter.   Current Outpatient Medications  Medication Sig Dispense Refill   hydrOXYzine (ATARAX) 25 MG tablet Take 1 tablet (25 mg total) by mouth 3 (  three) times daily as needed for anxiety. 90 tablet 1   risperiDONE (RISPERDAL) 1 MG tablet Take 1 tablet (1 mg total) by mouth daily. 30 tablet 1   sertraline (ZOLOFT) 50 MG tablet Take 1 tablet (50 mg total) by mouth daily. 30 tablet 1   traZODone (DESYREL) 50 MG tablet Take 1 tablet (50 mg total) by mouth at bedtime as needed for sleep. 30 tablet 1   nicotine (NICODERM CQ - DOSED IN MG/24 HOURS) 14 mg/24hr patch Place 1 patch (14 mg total) onto the skin daily. (Patient not taking: Reported on 04/30/2022) 28 patch 0    Musculoskeletal: Strength  & Muscle Tone: within normal limits Gait & Station: normal Patient leans: N/A   Psychiatric Specialty Exam: Presentation  General Appearance:  Disheveled  Eye Contact: Fair  Speech: Clear and Coherent  Speech Volume: Normal  Handedness: Right   Mood and Affect  Mood: Euthymic  Affect: Appropriate   Thought Process  Thought Processes: Coherent  Descriptions of Associations:Intact  Orientation:Full (Time, Place and Person)  Thought Content:WDL  History of Schizophrenia/Schizoaffective disorder:Yes  Duration of Psychotic Symptoms:Less than six months  Hallucinations:Hallucinations: None  Ideas of Reference:None  Suicidal Thoughts:Suicidal Thoughts: Yes, Passive  Homicidal Thoughts:Homicidal Thoughts: No   Sensorium  Memory: Immediate Fair; Remote Fair  Judgment: Fair  Insight: Fair   Community education officer  Concentration: Fair  Attention Span: FairKermit Balo  Recall: Good  Fund of Knowledge: Good  Language: Good   Psychomotor Activity  Psychomotor Activity: Psychomotor Activity: Normal   Assets  Assets: Communication Skills; Social Support    Sleep  Sleep: Sleep: Fair   Physical Exam: Physical Exam HENT:     Head: Normocephalic.  Eyes:     Pupils: Pupils are equal, round, and reactive to light.  Pulmonary:     Effort: Pulmonary effort is normal.  Musculoskeletal:     Cervical back: Normal range of motion.  Neurological:     Mental Status: He is alert.  Psychiatric:        Attention and Perception: Attention normal.        Mood and Affect: Mood is anxious.        Speech: Speech normal.        Behavior: Behavior is cooperative.        Thought Content: Thought content includes suicidal ideation.        Cognition and Memory: Memory normal.        Judgment: Judgment is impulsive and inappropriate.    Review of Systems  Constitutional: Negative.   HENT: Negative.    Respiratory: Negative.    Musculoskeletal:  Negative.   Psychiatric/Behavioral:  Positive for depression, substance abuse and suicidal ideas.    Blood pressure 128/60, pulse 60, temperature 98.7 F (37.1 C), temperature source Oral, resp. rate 16, SpO2 100 %. There is no height or weight on file to calculate BMI.   Medical Decision Making: Recommended patient to [facility] for observation, community resources, and medication management. Patient has agreed to go. Admitted to [facility], pending COVID results.      Michaele Offer, PMHNP 04/30/2022 4:05 PM

## 2022-04-30 NOTE — ED Provider Notes (Signed)
Emergency Medicine Observation Re-evaluation Note  Bryan Payne is a 27 y.o. male, seen on rounds today.  Pt initially presented to the ED for complaints of Laceration (R Hand) and Suicidal Currently, the patient is resting.  Physical Exam  BP 135/62   Pulse 77   Temp 98.1 F (36.7 C) (Oral)   Resp 18   SpO2 96%  Physical Exam Neurological:     Mental Status: He is alert.      ED Course / MDM  EKG:EKG Interpretation  Date/Time:  Wednesday April 29 2022 23:16:32 EDT Ventricular Rate:  64 PR Interval:  155 QRS Duration: 97 QT Interval:  452 QTC Calculation: 467 R Axis:   89 Text Interpretation: Sinus rhythm Confirmed by Addison Lank [phone]) on 04/30/2022 3:19:38 AM  I have reviewed the labs performed to date as well as medications administered while in observation.  Recent changes in the last 24 hours include tylenol and hydrozyzine givne for pain and anxirety..  Plan  Current plan is for re-eval for psych in AM.    Lennice Sites, DO 04/30/22 [phone]

## 2022-04-30 NOTE — BH Assessment (Addendum)
Comprehensive Clinical Assessment (CCA) Note  04/30/2022 QUITMAN [name] BZ:[id number] Disposition: Oncoming provider at [facility] will need to provide disposition.    Patient says he does not care if he is alive and takes risks because he does not pay attention to safety.  Patient is guarded about hallucinations.  Pt has poor access to food due to homelessness and poor sleep due to same.  Pt was at [facility] in February per his report.  He also said he stopped having [facility] in January.  He is desiring inpatient care if appropriate.    Chief Complaint:  Chief Complaint  Patient presents with   Laceration    R Hand   Suicidal   Visit Diagnosis: MDD recurrent, severe; Cocaine use d/o severe; ETOH use d/o severe    CCA Screening, Triage and Referral (STR)  Patient Reported Information How did you hear about [place]? Self  What Is the Reason for Your Visit/Call Today? Pt had brought himself to [facility].  He had punched a window with his right hand and had to seek medical attention as he was bleeding "profusely."  Pt said he had hit the window out of aggrevation.  Pt has been cutting himself on the left forearm over the last week.  "I wanted to feel some pain becaue I have been going through some pain in my life."  Pt feels like he has given up on life.  He says "not necessarily" when asked if he has had previous suicide attempts.  Pt says he wanted to hurt anyone he says "I hit the glass because I wanted to hit someone."  He says he has "complete frustration with everything."  Pt said he is homeless currently.  He used to have ACTT services from [facility] but "not with them since January '24."  Patient said "I prefer not to answer" when asked about A/V hallucinations.  Pt says he does not have any weapons.  Patient says he uses cocaine on a regular basis.  He has been at [facility] in February.  Pt says he drinks ETOH.  He will drink a 16 or 24 oz malt beverage about three times a  week.  How Long Has This Been Causing You Problems? 1-6 months  What Do You Feel Would Help You the Most Today? Treatment for Depression or other mood problem; Alcohol or Drug Use Treatment   Have You Recently Had Any Thoughts About Hurting Yourself? Yes  Are You Planning to Commit Suicide/Harm Yourself At This time? Yes   [facility] from 04/29/2022 in [facility] Emergency Department at [facility] ED from 04/13/2022 in [facility] Emergency Department at [facility] ED from 04/10/2022 in [facility] Emergency Department at [place] Moderate Risk Error: Question 1 not populated Error: Q3, 4, or 5 should not be populated when Q2 is No       Have you Recently Had Thoughts About [place]? Yes  Are You Planning to Harm Someone at This Time? No  Explanation: Pt feels like he wants to overdose or get someone else to put him out of his misery.  Pt has thoughts of harming others but no plan or real intention.   Have You Used Any Alcohol or Drugs in the Past 24 Hours? Yes  What Did You Use and How Much? ETOH and crack cocaine.  About a gram of cocaine and   Do You Currently Have a Therapist/Psychiatrist? No  Name  of Therapist/Psychiatrist: Name of Therapist/Psychiatrist: Pt used to have [facility] but says he stopped using them in January '24   Have You Been Recently Discharged From Any [facility] or Programs? Yes  Explanation of Discharge From Practice/Program: [facility] in February     CCA Screening Triage Referral Assessment Type of Contact: Tele-Assessment  Telemedicine Service Delivery:   Is this Initial or Reassessment? Is this Initial or Reassessment?: Initial Assessment  Date Telepsych consult ordered in [facility]:  Date Telepsych consult ordered in [facility]: 04/30/22  Time Telepsych consult ordered in [facility]:  Time Telepsych consult ordered in [facility]: [id number]  Location of Assessment: [facility] ED  Provider  Location: [facility] Assessment Services   Collateral Involvement: None provided   Does Patient Have a [place]? No  Legal Guardian Contact Information: Pt does not have a legal guardian  Copy of Legal Guardianship Form: -- (Pt does not have a legal guardian)  Legal Guardian Notified of Arrival: -- (Pt does not have a legal guardian)  Legal Guardian Notified of Pending Discharge: -- (Pt does not have a legal guardian)  If Minor and Not Living with Parent(s), Who has Custody? Pt is a adult  Is CPS involved or ever been involved? Never  Is APS involved or ever been involved? Never   Patient Determined To Be At Risk for Harm To Self or Others Based on Review of Patient Reported Information or Presenting Complaint? Yes, for Self-Harm  Method: No Plan  Availability of Means: -- (Pt has been cutting himself with sharps.)  Intent: -- (Pt has no intention on HI.  Has been self harming.)  Notification Required: No need or identified person  Additional Information for Danger to Others Potential: Previous attempts (Pt has been violent with police in the past.)  Additional Comments for Danger to Others Potential: Vague HI.  No plan or intention.  Are There Guns or Other Weapons in [place]? No  Types of Guns/Weapons: None  Are These Weapons Safely Secured?                            No  Who Could Verify You Are Able To Have These Secured: no one.  no weapons per patient  Do You Have any Outstanding Charges, Pending Court Dates, Parole/Probation? Denies  Contacted To Inform of Risk of Harm To Self or Others: Other: Comment (No one to inform)    Does Patient Present under Involuntary Commitment? No    [place] of Residence: [place]   Patient Currently Receiving the Following Services: Not Receiving Services   Determination of Need: Urgent (48 hours)   Options For Referral: Other: Comment (Pt disposition to be determined by oncoming  provider.)     CCA Biopsychosocial Patient Reported Schizophrenia/Schizoaffective Diagnosis in Past: No   Strengths: good self awareness--pt seeking help   Mental Health Symptoms Depression:   Difficulty Concentrating; Weight gain/loss   Duration of Depressive symptoms:  Duration of Depressive Symptoms: Greater than two weeks   Mania:   Irritability; Racing thoughts   Anxiety:    Worrying; Difficulty concentrating   Psychosis:   Affective flattening/alogia/avolition   Duration of Psychotic symptoms:  Duration of Psychotic Symptoms: Greater than six months   Trauma:   None   Obsessions:   None   Compulsions:   None   Inattention:   None   Hyperactivity/Impulsivity:   None   Oppositional/Defiant Behaviors:   Aggression towards people/animals  Emotional Irregularity:   Chronic feelings of emptiness; Mood lability   Other Mood/Personality Symptoms:   none noted throughout assessment    Mental Status Exam Appearance and self-care  Stature:   Average   Weight:   Average weight   Clothing:   Disheveled   Grooming:   Neglected   Cosmetic use:   None   Posture/gait:   Normal   Motor activity:   Slowed   Sensorium  Attention:   Normal   Concentration:   Preoccupied; Scattered; Variable   Orientation:   Situation; Place; Person; Object   Recall/memory:   Normal   Affect and Mood  Affect:   Appropriate; Depressed; Flat   Mood:   Depressed   Relating  Eye contact:   Normal   Facial expression:   Depressed   Attitude toward examiner:   Cooperative   Thought and Language  Speech flow:  Slow   Thought content:   Persecutions   Preoccupation:   Ruminations   Hallucinations:   None Michela Pitcher he prefers not to answer.)   Organization:   Loose   Transport planner of Knowledge:   Average   Intelligence:   Average   Abstraction:   Functional   Judgement:   Poor; Impaired   Reality Testing:    Variable   Insight:   Poor; Gaps   Decision Making:   Vacilates; Paralyzed; Confused   Social Functioning  Social Maturity:   Impulsive   Social Judgement:   Heedless   Stress  Stressors:   Housing   Coping Ability:   Overwhelmed   Skill Deficits:   Self-control; Self-care; Responsibility; Interpersonal; Communication; Decision making   Supports:   Family     Religion: Religion/Spirituality Are You A Religious Person?: No How Might This Affect Treatment?: no impact  Leisure/Recreation: Leisure / Recreation Do You Have Hobbies?: No  Exercise/Diet: Exercise/Diet Do You Exercise?: Yes What Type of Exercise Do You Do?: Run/Walk How Many Times a Week Do You Exercise?: 6-7 times a week Have You Gained or Lost A Significant Amount of Weight in the Past Six Months?: No Do You Follow a Special Diet?: No Do You Have Any Trouble Sleeping?: Yes Explanation of Sleeping Difficulties: pt reports that he is not sleeping well due to homelessness   CCA Employment/Education Employment/Work Situation: Employment / Work Situation Employment Situation: On disability Why is Patient on Disability: Mental Health How Long has Patient Been on Disability: unknown Patient's Job has Been Impacted by Current Illness: No Has Patient ever Been in the [employer]?: No  Education: Education Is Patient Currently Attending School?: No Last Grade Completed: 12 Did You Attend College?: No Did You Have An Individualized Education Program (IIEP): No Did You Have Any Difficulty At School?: No Patient's Education Has Been Impacted by Current Illness: No   CCA Family/Childhood History Family and Relationship History: Family history Marital status: Single Does patient have children?: No  Childhood History:  Childhood History By whom was/is the patient raised?: Mother Did patient suffer any verbal/emotional/physical/sexual abuse as a child?:  (Did not want to discusss) Did patient  suffer from severe childhood neglect?: No Has patient ever been sexually abused/assaulted/raped as an adolescent or adult?:  (Pt did not want to discuss) Was the patient ever a victim of a crime or a disaster?: No Witnessed domestic violence?:  (Did not wish to discuss) Has patient been affected by domestic violence as an adult?:  (Did not wish to discuss)  CCA Substance Use Alcohol/Drug Use: Alcohol / Drug Use Pain Medications: SEE MAR Prescriptions: SEE MAR Over the Counter: SEE MAR History of alcohol / drug use?: Yes Longest period of sobriety (when/how long): 13 days in Rehab in February '24 Negative Consequences of Use: Personal relationships, Financial Withdrawal Symptoms: Aggressive/Assaultive, Patient aware of relationship between substance abuse and physical/medical complications Substance #1 Name of Substance 1: Crack cocaine 1 - Age of First Use: 27 years of age 65 - Amount (size/oz): "About a gram a day" 1 - Frequency: Daily 1 - Duration: ongoing for 3 years 1 - Last Use / Amount: 03/20.  About a gram 1 - Method of Aquiring: illegal activity 1- Route of Use: smoking                       ASAM's:  Six Dimensions of Multidimensional Assessment  Dimension 1:  Acute Intoxication and/or Withdrawal Potential:      Dimension 2:  Biomedical Conditions and Complications:      Dimension 3:  Emotional, Behavioral, or Cognitive Conditions and Complications:     Dimension 4:  Readiness to Change:     Dimension 5:  Relapse, Continued use, or Continued Problem Potential:     Dimension 6:  Recovery/Living Environment:     ASAM Severity Score:    ASAM Recommended Level of Treatment:     Substance use Disorder (SUD)    Recommendations for Services/Supports/Treatments:    Discharge Disposition:    DSM5 Diagnoses: Patient Active Problem List   Diagnosis Date Noted   Substance induced mood disorder ([place]) 03/21/2022   Addiction to crack 03/19/2022    Polysubstance abuse ([place]) 03/19/2022   Cocaine use with cocaine-induced mood disorder ([place]) 03/09/2022   Polysubstance (excluding opioids) dependence, daily use (HCC)    Adjustment disorder with disturbance of emotion 05/01/2017   Mushroom poisoning 12/22/2014   Acute renal failure ([place]) 12/22/2014   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

## 2022-04-30 NOTE — ED Notes (Signed)
Patient discharged off unit to [facility] per provider. Patient alert, calm, cooperative, no s/s of distress. Patient discharge information and belongings given to [facility] for facility. Patient ambulatory off unit, escorted off unit. Patient transported by [facility]

## 2022-04-30 NOTE — ED Provider Notes (Signed)
[facility] Continuous Assessment Admission H&P  Date: 04/30/22 Patient Name: Bryan Payne MRN: [id number] Chief Complaint: "I busted my hand last night by hitting a window because I was frustrated at everything, I been sleeping outside for a month"  Diagnoses:  Final diagnoses:  Cocaine use with cocaine-induced mood disorder ([place])   HPI: Bryan Payne is a 27 y.o. male patient with a past psychiatric history of cocaine-induced mood disorder, schizophrenia, suicidal ideations, MDD, adjustment disorder with disturbance of emotion, and polysubstance use who was admitted to the continuous observation unit from [facility] where he presented voluntarily for SI and a laceration to right hand.   Patient seen face-to-face by this provider and chart reviewed on 04/30/22. On evaluation, Bryan Payne is seated in assessment area in no acute distress. Patient is alert and oriented x4, calm, cooperative, and pleasant during assessment. Speech is clear and coherent, normal rate and volume. Appearance appropriate for environment, eye contact is fair. Mood is depressed with flat and congruent affect. Thought process is coherent with logical thought content. Patient denies current suicidal and homicidal ideations, however states he experienced passive suicidal ideations yesterday along with thoughts that he "wanted to fight someone." Patient denies that he wants to fight anyone in particular. Patient denies a history of suicide attempts. Patient reports a history of self-harm by cutting his left arm, he states that he is unsure of when this occurred. Scarring noted to left forearm during assessment. Patient reports a past inpatient psychiatric hospitalization at [facility] 10/2000. Patient denies currently having outpatient psychiatric services in place. 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 Objectively, there is no evidence of psychosis/mania, delusional thinking, or indication that patient is responding to internal stimuli.   Patient endorses increased sleep and appetite. Patient states, "I busted my hand last night by hitting a window because I was frustrated at everything, I been sleeping outside for a month." Patient is currently homeless and unemployed. Patient states he is supposed to be taking Risperdal, Trazodone, Zoloft, and Hydroxyzine and is unsure of when he last took his medications prior this hospital admission. Per chart review, patient received Risperdal 1mg  04/30/22 at 1430, Zoloft 50mg  04/30/22 at 1000, and Hydroxyzine 25mg  04/30/22 at 0815 at [facility]. Patient endorses 1 gram/daily cocaine use for "years," last use was 2 days ago. Patient denies use of alcohol or other illicit substances. Patient denies access to weapons. Patient denies a history of trauma/abuse. Patient denies any current legal issues.   Patient offered support and encouragement. Discussed admission to the continuous observation unit overnight for medication administration and safety monitoring with reevaluation tomorrow morning and patient is in agreement with plan of care. Patient is requesting to be admitted inpatient.   Total Time spent with patient: 30 minutes  Musculoskeletal  Strength & Muscle Tone: within normal limits Gait & Station: normal Patient leans: N/A  Psychiatric Specialty Exam  Presentation General Appearance:  Appropriate for Environment  Eye Contact: Fair  Speech: Clear and Coherent; Normal Rate  Speech Volume: Normal  Handedness: Right   Mood and Affect  Mood: Depressed  Affect: Congruent; Depressed; Flat   Thought Process  Thought Processes: Coherent; Goal Directed  Descriptions of Associations:Intact  Orientation:Full (Time, Place  and Person)  Thought Content:Logical  Diagnosis of Schizophrenia or Schizoaffective  disorder in past: Yes  Duration of Psychotic Symptoms: Less than six months  Hallucinations:Hallucinations: None  Ideas of Reference:None  Suicidal Thoughts:Suicidal Thoughts: Yes, Passive SI Passive Intent and/or Plan: Without Intent; Without Plan  Homicidal Thoughts:Homicidal Thoughts: No   Sensorium  Memory: Immediate Fair; Recent Fair; Remote Fair  Judgment: Fair  Insight: Fair   Materials engineer: Fair  Attention Span: Fair  Recall: [employer] of Knowledge: Fair  Language: Fair   Psychomotor Activity  Psychomotor Activity: Psychomotor Activity: Normal   Assets  Assets: Communication Skills; Desire for Improvement; Resilience   Sleep  Sleep: Sleep: Good Number of Hours of Sleep: 9   Nutritional Assessment (For OBS and FBC admissions only) Has the patient had a weight loss or gain of 10 pounds or more in the last 3 months?: No Has the patient had a decrease in food intake/or appetite?: No Does the patient have dental problems?: No Does the patient have eating habits or behaviors that may be indicators of an eating disorder including binging or inducing vomiting?: No Has the patient recently lost weight without trying?: 0 Has the patient been eating poorly because of a decreased appetite?: 0 Malnutrition Screening Tool Score: 0    Physical Exam Vitals and nursing note reviewed.  Constitutional:      General: He is not in acute distress.    Appearance: Normal appearance. He is not ill-appearing.  HENT:     Head: Normocephalic and atraumatic.     Nose: Nose normal.  Eyes:     General:        Right eye: No discharge.        Left eye: No discharge.     Conjunctiva/sclera: Conjunctivae normal.  Cardiovascular:     Rate and Rhythm: Normal rate.  Pulmonary:     Effort: Pulmonary effort is normal. No respiratory distress.   Musculoskeletal:        General: Normal range of motion.     Cervical back: Normal range of motion.  Skin:    General: Skin is warm and dry.  Neurological:     General: No focal deficit present.     Mental Status: He is alert and oriented to person, place, and time. Mental status is at baseline.  Psychiatric:        Attention and Perception: Attention and perception normal.        Mood and Affect: Mood is depressed. Affect is flat.        Speech: Speech normal.        Behavior: Behavior normal. Behavior is cooperative.        Thought Content: Thought content normal.        Cognition and Memory: Cognition and memory normal.        Judgment: Judgment normal.    Review of Systems  Constitutional: Negative.   HENT: Negative.    Eyes: Negative.   Respiratory: Negative.    Cardiovascular: Negative.   Gastrointestinal: Negative.   Genitourinary: Negative.   Musculoskeletal: Negative.   Skin: Negative.   Neurological: Negative.   Endo/Heme/Allergies: Negative.   Psychiatric/Behavioral:  Positive for depression and substance abuse. Negative for hallucinations, memory loss and suicidal ideas. The patient is not nervous/anxious and does not have insomnia.     SpO2 100 %. There is no height or weight on file to calculate BMI.  Past Psychiatric History: Cocaine-induced mood disorder, Schizophrenia, suicidal ideations, MDD, adjustment disorder with disturbance of emotion, polysubstance use  Is the patient  at risk to self?  Patient is unsure Has the patient been a risk to self in the past 6 months?  Patient denies .    Has the patient been a risk to self within the distant past?  Patient denies   Is the patient a risk to others?  Patient denies   Has the patient been a risk to others in the past 6 months?  Patient denies   Has the patient been a risk to others within the distant past?  Patient denies  Past Medical History:  Past Medical History:  Diagnosis Date   Homeless    Knee  derangement 04/2013   left   Lateral meniscus tear 04/2013   left knee   Family History:  Family History  Problem Relation Age of Onset   Kidney failure Father        Renal transplant   Diabetes Other        Family on father's side   Social History:  Social History   Tobacco Use   Smoking status: Every Day    Packs/day: .5    Types: Cigars, Cigarettes    Passive exposure: Current   Smokeless tobacco: Never  Vaping Use   Vaping Use: Never used  Substance Use Topics   Alcohol use: Yes    Comment: "often"   Drug use: Yes    Types: Marijuana, Methamphetamines, Cocaine    Comment: daily, hallucinagenics    Last Labs:  Admission on 04/29/2022, Discharged on 04/30/2022  Component Date Value Ref Range Status   Sodium 04/29/2022 138  135 - 145 mmol/L Final   Potassium 04/29/2022 3.3 (L)  3.5 - 5.1 mmol/L Final   Chloride 04/29/2022 104  98 - 111 mmol/L Final   CO2 04/29/2022 25  22 - 32 mmol/L Final   Glucose, Bld 04/29/2022 75  70 - 99 mg/dL Final   Glucose reference range applies only to samples taken after fasting for at least 8 hours.   BUN 04/29/2022 14  6 - 20 mg/dL Final   Creatinine, Ser 04/29/2022 0.96  0.61 - 1.24 mg/dL Final   Calcium 04/29/2022 8.7 (L)  8.9 - 10.3 mg/dL Final   Total Protein 04/29/2022 6.4 (L)  6.5 - 8.1 g/dL Final   Albumin 04/29/2022 4.0  3.5 - 5.0 g/dL Final   AST 04/29/2022 47 (H)  15 - 41 U/L Final   ALT 04/29/2022 38  0 - 44 U/L Final   Alkaline Phosphatase 04/29/2022 40  38 - 126 U/L Final   Total Bilirubin 04/29/2022 1.2  0.3 - 1.2 mg/dL Final   GFR, Estimated 04/29/2022 >60  >60 mL/min Final   Comment: (NOTE) Calculated using the CKD-EPI Creatinine Equation (2021)    Anion gap 04/29/2022 9  5 - 15 Final   Performed at [facility], [place] [address], [place], [place] [address]   Alcohol, Ethyl (B) 04/29/2022 <10  <10 mg/dL Final   Comment: (NOTE) Lowest detectable limit for serum alcohol is 10 mg/dL.  For medical  purposes only. Performed at [facility], [place] [address], [place], [place] [address]    Opiates 04/29/2022 NONE DETECTED  NONE DETECTED Final   Cocaine 04/29/2022 POSITIVE (A)  NONE DETECTED Final   Benzodiazepines 04/29/2022 NONE DETECTED  NONE DETECTED Final   Amphetamines 04/29/2022 NONE DETECTED  NONE DETECTED Final   Tetrahydrocannabinol 04/29/2022 POSITIVE (A)  NONE DETECTED Final   Barbiturates 04/29/2022 NONE DETECTED  NONE DETECTED Final   Comment: (  NOTE) DRUG SCREEN FOR MEDICAL PURPOSES ONLY.  IF CONFIRMATION IS NEEDED FOR ANY PURPOSE, NOTIFY LAB WITHIN 5 DAYS.  LOWEST DETECTABLE LIMITS FOR URINE DRUG SCREEN Drug Class                     Cutoff (ng/mL) Amphetamine and metabolites    1000 Barbiturate and metabolites    200 Benzodiazepine                 200 Opiates and metabolites        300 Cocaine and metabolites        300 THC                            50 Performed at [facility], [place] [address] [place], [place] [address]    WBC 04/29/2022 8.6  4.0 - 10.5 K/uL Final   RBC 04/29/2022 4.46  4.22 - 5.81 MIL/uL Final   Hemoglobin 04/29/2022 13.8  13.0 - 17.0 g/dL Final   HCT 04/29/2022 41.6  39.0 - 52.0 % Final   MCV 04/29/2022 93.3  80.0 - 100.0 fL Final   MCH 04/29/2022 30.9  26.0 - 34.0 pg Final   MCHC 04/29/2022 33.2  30.0 - 36.0 g/dL Final   RDW 04/29/2022 12.7  11.5 - 15.5 % Final   Platelets 04/29/2022 286  150 - 400 K/uL Final   nRBC 04/29/2022 0.0  0.0 - 0.2 % Final   Neutrophils Relative % 04/29/2022 57  % Final   Neutro Abs 04/29/2022 4.8  1.7 - 7.7 K/uL Final   Lymphocytes Relative 04/29/2022 31  % Final   Lymphs Abs 04/29/2022 2.7  0.7 - 4.0 K/uL Final   Monocytes Relative 04/29/2022 8  % Final   Monocytes Absolute 04/29/2022 0.7  0.1 - 1.0 K/uL Final   Eosinophils Relative 04/29/2022 3  % Final   Eosinophils Absolute 04/29/2022 0.3  0.0 - 0.5 K/uL Final   Basophils Relative 04/29/2022 1  % Final   Basophils  Absolute 04/29/2022 0.1  0.0 - 0.1 K/uL Final   Immature Granulocytes 04/29/2022 0  % Final   Abs Immature Granulocytes 04/29/2022 0.02  0.00 - 0.07 K/uL Final   Performed at [facility], [place] [address] [place], [place] [address]   SARS Coronavirus 2 by RT PCR 04/30/2022 NEGATIVE  NEGATIVE Final   Comment: (NOTE) SARS-CoV-2 target nucleic acids are NOT DETECTED.  The SARS-CoV-2 RNA is generally detectable in upper respiratory specimens during the acute phase of infection. The lowest concentration of SARS-CoV-2 viral copies this assay can detect is 138 copies/mL. A negative result does not preclude SARS-Cov-2 infection and should not be used as the sole basis for treatment or other patient management decisions. A negative result may occur with  improper specimen collection/handling, submission of specimen other than nasopharyngeal swab, presence of viral mutation(s) within the areas targeted by this assay, and inadequate number of viral copies(<138 copies/mL). A negative result must be combined with clinical observations, patient history, and epidemiological information. The expected result is Negative.  Fact Sheet for Patients:  EntrepreneurPulse.com.au  Fact Sheet for Healthcare Providers:  IncredibleEmployment.be  This test is no                          t yet approved or cleared by the [place] FDA and  has been authorized for detection and/or diagnosis  of SARS-CoV-2 by FDA under an Emergency Use Authorization (EUA). This EUA will remain  in effect (meaning this test can be used) for the duration of the COVID-19 declaration under Section 564(b)(1) of the Act, 21 U.S.C.section 360bbb-3(b)(1), unless the authorization is terminated  or revoked sooner.       Influenza A by PCR 04/30/2022 NEGATIVE  NEGATIVE Final   Influenza B by PCR 04/30/2022 NEGATIVE  NEGATIVE Final   Comment: (NOTE) The Xpert Xpress  SARS-CoV-2/FLU/RSV plus assay is intended as an aid in the diagnosis of influenza from Nasopharyngeal swab specimens and should not be used as a sole basis for treatment. Nasal washings and aspirates are unacceptable for Xpert Xpress SARS-CoV-2/FLU/RSV testing.  Fact Sheet for Patients: EntrepreneurPulse.com.au  Fact Sheet for Healthcare Providers: IncredibleEmployment.be  This test is not yet approved or cleared by the [place] FDA and has been authorized for detection and/or diagnosis of SARS-CoV-2 by FDA under an Emergency Use Authorization (EUA). This EUA will remain in effect (meaning this test can be used) for the duration of the COVID-19 declaration under Section 564(b)(1) of the Act, 21 U.S.C. section 360bbb-3(b)(1), unless the authorization is terminated or revoked.     Resp Syncytial Virus by PCR 04/30/2022 NEGATIVE  NEGATIVE Final   Comment: (NOTE) Fact Sheet for Patients: EntrepreneurPulse.com.au  Fact Sheet for Healthcare Providers: IncredibleEmployment.be  This test is not yet approved or cleared by the [place] FDA and has been authorized for detection and/or diagnosis of SARS-CoV-2 by FDA under an Emergency Use Authorization (EUA). This EUA will remain in effect (meaning this test can be used) for the duration of the COVID-19 declaration under Section 564(b)(1) of the Act, 21 U.S.C. section 360bbb-3(b)(1), unless the authorization is terminated or revoked.  Performed at [facility], [place] [address] [place], [place] [address]   Admission on 04/13/2022, Discharged on 04/13/2022  Component Date Value Ref Range Status   Acetaminophen (Tylenol), Serum 04/13/2022 <10 (L)  10 - 30 ug/mL Final   Comment: (NOTE) Therapeutic concentrations vary significantly. A range of 10-30 ug/mL  may be an effective concentration for many patients. However, some  are best treated  at concentrations outside of this range. Acetaminophen concentrations >150 ug/mL at 4 hours after ingestion  and >50 ug/mL at 12 hours after ingestion are often associated with  toxic reactions.  Performed at [facility], [place] [address], [place], [place] [address]    WBC 04/13/2022 9.2  4.0 - 10.5 K/uL Final   RBC 04/13/2022 5.18  4.22 - 5.81 MIL/uL Final   Hemoglobin 04/13/2022 15.9  13.0 - 17.0 g/dL Final   HCT 04/13/2022 49.3  39.0 - 52.0 % Final   MCV 04/13/2022 95.2  80.0 - 100.0 fL Final   MCH 04/13/2022 30.7  26.0 - 34.0 pg Final   MCHC 04/13/2022 32.3  30.0 - 36.0 g/dL Final   RDW 04/13/2022 12.8  11.5 - 15.5 % Final   Platelets 04/13/2022 271  150 - 400 K/uL Final   nRBC 04/13/2022 0.0  0.0 - 0.2 % Final   Neutrophils Relative % 04/13/2022 62  % Final   Neutro Abs 04/13/2022 5.7  1.7 - 7.7 K/uL Final   Lymphocytes Relative 04/13/2022 27  % Final   Lymphs Abs 04/13/2022 2.4  0.7 - 4.0 K/uL Final   Monocytes Relative 04/13/2022 8  % Final   Monocytes Absolute 04/13/2022 0.7  0.1 - 1.0 K/uL Final   Eosinophils Relative 04/13/2022 2  %  Final   Eosinophils Absolute 04/13/2022 0.2  0.0 - 0.5 K/uL Final   Basophils Relative 04/13/2022 1  % Final   Basophils Absolute 04/13/2022 0.1  0.0 - 0.1 K/uL Final   Immature Granulocytes 04/13/2022 0  % Final   Abs Immature Granulocytes 04/13/2022 0.02  0.00 - 0.07 K/uL Final   Performed at [facility], [place] [address], [place], [place] [address]   Sodium 04/13/2022 138  135 - 145 mmol/L Final   Potassium 04/13/2022 3.7  3.5 - 5.1 mmol/L Final   Chloride 04/13/2022 105  98 - 111 mmol/L Final   CO2 04/13/2022 26  22 - 32 mmol/L Final   Glucose, Bld 04/13/2022 82  70 - 99 mg/dL Final   Glucose reference range applies only to samples taken after fasting for at least 8 hours.   BUN 04/13/2022 20  6 - 20 mg/dL Final   Creatinine, Ser 04/13/2022 1.15  0.61 - 1.24 mg/dL Final   Calcium 04/13/2022 9.1  8.9  - 10.3 mg/dL Final   Total Protein 04/13/2022 7.6  6.5 - 8.1 g/dL Final   Albumin 04/13/2022 4.5  3.5 - 5.0 g/dL Final   AST 04/13/2022 58 (H)  15 - 41 U/L Final   ALT 04/13/2022 39  0 - 44 U/L Final   Alkaline Phosphatase 04/13/2022 50  38 - 126 U/L Final   Total Bilirubin 04/13/2022 1.1  0.3 - 1.2 mg/dL Final   GFR, Estimated 04/13/2022 >60  >60 mL/min Final   Comment: (NOTE) Calculated using the CKD-EPI Creatinine Equation (2021)    Anion gap 04/13/2022 7  5 - 15 Final   Performed at [facility], [place] [address], [place], [place] [id number]   Salicylate Lvl [id number] <7.0 (L)  7.0 - 30.0 mg/dL Final   Performed at [place] [address], [place], [place] [address]   Opiates 04/13/2022 NONE DETECTED  NONE DETECTED Final   Cocaine 04/13/2022 POSITIVE (A)  NONE DETECTED Final   Benzodiazepines 04/13/2022 NONE DETECTED  NONE DETECTED Final   Amphetamines 04/13/2022 NONE DETECTED  NONE DETECTED Final   Tetrahydrocannabinol 04/13/2022 POSITIVE (A)  NONE DETECTED Final   Barbiturates 04/13/2022 NONE DETECTED  NONE DETECTED Final   Comment: (NOTE) DRUG SCREEN FOR MEDICAL PURPOSES ONLY.  IF CONFIRMATION IS NEEDED FOR ANY PURPOSE, NOTIFY LAB WITHIN 5 DAYS.  LOWEST DETECTABLE LIMITS FOR URINE DRUG SCREEN Drug Class                     Cutoff (ng/mL) Amphetamine and metabolites    1000 Barbiturate and metabolites    200 Benzodiazepine                 200 Opiates and metabolites        300 Cocaine and metabolites        300 THC                            50 Performed at [facility], [place] [address], [place], [place] [address]    Alcohol, Ethyl (B) 04/13/2022 <10  <10 mg/dL Final   Comment: (NOTE) Lowest detectable limit for serum alcohol is 10 mg/dL.  For medical purposes only. Performed at [facility], [place] [address] [place], [place] [address]   Admission on 03/21/2022, Discharged on 03/22/2022   Component Date Value Ref Range Status   SARSCOV2ONAVIRUS 2 AG  03/21/2022 NEGATIVE  NEGATIVE Final   Comment: (NOTE) SARS-CoV-2 antigen NOT DETECTED.   Negative results are presumptive.  Negative results do not preclude SARS-CoV-2 infection and should not be used as the sole basis for treatment or other patient management decisions, including infection  control decisions, particularly in the presence of clinical signs and  symptoms consistent with COVID-19, or in those who have been in contact with the virus.  Negative results must be combined with clinical observations, patient history, and epidemiological information. The expected result is Negative.  Fact Sheet for Patients: HandmadeRecipes.com.cy  Fact Sheet for Healthcare Providers: FuneralLife.at  This test is not yet approved or cleared by the [place] FDA and  has been authorized for detection and/or diagnosis of SARS-CoV-2 by FDA under an Emergency Use Authorization (EUA).  This EUA will remain in effect (meaning this test can be used) for the duration of  the COV                          ID-19 declaration under Section 564(b)(1) of the Act, 21 U.S.C. section 360bbb-3(b)(1), unless the authorization is terminated or revoked sooner.    Admission on 03/20/2022, Discharged on 03/21/2022  Component Date Value Ref Range Status   Sodium 03/20/2022 140  135 - 145 mmol/L Final   Potassium 03/20/2022 3.8  3.5 - 5.1 mmol/L Final   Chloride 03/20/2022 106  98 - 111 mmol/L Final   CO2 03/20/2022 25  22 - 32 mmol/L Final   Glucose, Bld 03/20/2022 82  70 - 99 mg/dL Final   Glucose reference range applies only to samples taken after fasting for at least 8 hours.   BUN 03/20/2022 17  6 - 20 mg/dL Final   Creatinine, Ser 03/20/2022 1.06  0.61 - 1.24 mg/dL Final   Calcium 03/20/2022 9.0  8.9 - 10.3 mg/dL Final   Total Protein 03/20/2022 6.3 (L)  6.5 - 8.1 g/dL Final   Albumin  03/20/2022 3.7  3.5 - 5.0 g/dL Final   AST 03/20/2022 51 (H)  15 - 41 U/L Final   ALT 03/20/2022 35  0 - 44 U/L Final   Alkaline Phosphatase 03/20/2022 40  38 - 126 U/L Final   Total Bilirubin 03/20/2022 0.9  0.3 - 1.2 mg/dL Final   GFR, Estimated 03/20/2022 >60  >60 mL/min Final   Comment: (NOTE) Calculated using the CKD-EPI Creatinine Equation (2021)    Anion gap 03/20/2022 9  5 - 15 Final   Performed at [facility], [place] [address], [place], [place] [address]   Alcohol, Ethyl (B) 03/20/2022 <10  <10 mg/dL Final   Comment: (NOTE) Lowest detectable limit for serum alcohol is 10 mg/dL.  For medical purposes only. Performed at [facility], [place] [address], [place], [place] [address]    Opiates 03/20/2022 NONE DETECTED  NONE DETECTED Final   Cocaine 03/20/2022 POSITIVE (A)  NONE DETECTED Final   Benzodiazepines 03/20/2022 NONE DETECTED  NONE DETECTED Final   Amphetamines 03/20/2022 NONE DETECTED  NONE DETECTED Final   Tetrahydrocannabinol 03/20/2022 NONE DETECTED  NONE DETECTED Final   Barbiturates 03/20/2022 NONE DETECTED  NONE DETECTED Final   Comment: (NOTE) DRUG SCREEN FOR MEDICAL PURPOSES ONLY.  IF CONFIRMATION IS NEEDED FOR ANY PURPOSE, NOTIFY LAB WITHIN 5 DAYS.  LOWEST DETECTABLE LIMITS FOR URINE DRUG SCREEN Drug Class  Cutoff (ng/mL) Amphetamine and metabolites    1000 Barbiturate and metabolites    200 Benzodiazepine                 200 Opiates and metabolites        300 Cocaine and metabolites        300 THC                            50 Performed at [facility], [place] [address], [place], [place] [address]    WBC 03/20/2022 12.3 (H)  4.0 - 10.5 K/uL Final   RBC 03/20/2022 4.57  4.22 - 5.81 MIL/uL Final   Hemoglobin 03/20/2022 14.3  13.0 - 17.0 g/dL Final   HCT 03/20/2022 43.1  39.0 - 52.0 % Final   MCV 03/20/2022 94.3  80.0 - 100.0 fL Final   MCH 03/20/2022 31.3  26.0 - 34.0 pg Final   MCHC 03/20/2022 33.2  30.0 - 36.0 g/dL  Final   RDW 03/20/2022 13.0  11.5 - 15.5 % Final   Platelets 03/20/2022 257  150 - 400 K/uL Final   nRBC 03/20/2022 0.0  0.0 - 0.2 % Final   Neutrophils Relative % 03/20/2022 63  % Final   Neutro Abs 03/20/2022 7.9 (H)  1.7 - 7.7 K/uL Final   Lymphocytes Relative 03/20/2022 25  % Final   Lymphs Abs 03/20/2022 3.0  0.7 - 4.0 K/uL Final   Monocytes Relative 03/20/2022 8  % Final   Monocytes Absolute 03/20/2022 1.0  0.1 - 1.0 K/uL Final   Eosinophils Relative 03/20/2022 3  % Final   Eosinophils Absolute 03/20/2022 0.3  0.0 - 0.5 K/uL Final   Basophils Relative 03/20/2022 1  % Final   Basophils Absolute 03/20/2022 0.1  0.0 - 0.1 K/uL Final   Immature Granulocytes 03/20/2022 0  % Final   Abs Immature Granulocytes 03/20/2022 0.05  0.00 - 0.07 K/uL Final   Performed at [facility], [place] [address], [place], [place] [address]   SARS Coronavirus 2 by RT PCR 03/21/2022 NEGATIVE  NEGATIVE Final   Influenza A by PCR 03/21/2022 NEGATIVE  NEGATIVE Final   Influenza B by PCR 03/21/2022 NEGATIVE  NEGATIVE Final   Comment: (NOTE) The Xpert Xpress SARS-CoV-2/FLU/RSV plus assay is intended as an aid in the diagnosis of influenza from Nasopharyngeal swab specimens and should not be used as a sole basis for treatment. Nasal washings and aspirates are unacceptable for Xpert Xpress SARS-CoV-2/FLU/RSV testing.  Fact Sheet for Patients: EntrepreneurPulse.com.au  Fact Sheet for Healthcare Providers: IncredibleEmployment.be  This test is not yet approved or cleared by the [place] FDA and has been authorized for detection and/or diagnosis of SARS-CoV-2 by FDA under an Emergency Use Authorization (EUA). This EUA will remain in effect (meaning this test can be used) for the duration of the COVID-19 declaration under Section 564(b)(1) of the Act, 21 U.S.C. section 360bbb-3(b)(1), unless the authorization is terminated or revoked.     Resp Syncytial Virus by  PCR 03/21/2022 NEGATIVE  NEGATIVE Final   Comment: (NOTE) Fact Sheet for Patients: EntrepreneurPulse.com.au  Fact Sheet for Healthcare Providers: IncredibleEmployment.be  This test is not yet approved or cleared by the [place] FDA and has been authorized for detection and/or diagnosis of SARS-CoV-2 by FDA under an Emergency Use Authorization (EUA). This EUA will remain in effect (meaning this test can be used) for the duration of the COVID-19 declaration under  Section 564(b)(1) of the Act, 21 U.S.C. section 360bbb-3(b)(1), unless the authorization is terminated or revoked.  Performed at [facility], [place] [address], [place], [place] [address]   Admission on 03/19/2022, Discharged on 03/19/2022  Component Date Value Ref Range Status   Sodium 03/19/2022 140  135 - 145 mmol/L Final   Potassium 03/19/2022 3.3 (L)  3.5 - 5.1 mmol/L Final   Chloride 03/19/2022 103  98 - 111 mmol/L Final   CO2 03/19/2022 24  22 - 32 mmol/L Final   Glucose, Bld 03/19/2022 132 (H)  70 - 99 mg/dL Final   Glucose reference range applies only to samples taken after fasting for at least 8 hours.   BUN 03/19/2022 11  6 - 20 mg/dL Final   Creatinine, Ser 03/19/2022 0.90  0.61 - 1.24 mg/dL Final   Calcium 03/19/2022 9.2  8.9 - 10.3 mg/dL Final   Total Protein 03/19/2022 5.9 (L)  6.5 - 8.1 g/dL Final   Albumin 03/19/2022 3.7  3.5 - 5.0 g/dL Final   AST 03/19/2022 44 (H)  15 - 41 U/L Final   ALT 03/19/2022 29  0 - 44 U/L Final   Alkaline Phosphatase 03/19/2022 38  38 - 126 U/L Final   Total Bilirubin 03/19/2022 0.9  0.3 - 1.2 mg/dL Final   GFR, Estimated 03/19/2022 >60  >60 mL/min Final   Comment: (NOTE) Calculated using the CKD-EPI Creatinine Equation (2021)    Anion gap 03/19/2022 13  5 - 15 Final   Performed at [facility], [place] [address] [place], [place] [address]   Alcohol, Ethyl (B) 03/19/2022 <10  <10 mg/dL Final   Comment: (NOTE) Lowest  detectable limit for serum alcohol is 10 mg/dL.  For medical purposes only. Performed at [facility], [place] [address] [place], [place] [address]    WBC 03/19/2022 12.3 (H)  4.0 - 10.5 K/uL Final   RBC 03/19/2022 4.52  4.22 - 5.81 MIL/uL Final   Hemoglobin 03/19/2022 14.4  13.0 - 17.0 g/dL Final   HCT 03/19/2022 41.6  39.0 - 52.0 % Final   MCV 03/19/2022 92.0  80.0 - 100.0 fL Final   MCH 03/19/2022 31.9  26.0 - 34.0 pg Final   MCHC 03/19/2022 34.6  30.0 - 36.0 g/dL Final   RDW 03/19/2022 12.9  11.5 - 15.5 % Final   Platelets 03/19/2022 262  150 - 400 K/uL Final   nRBC 03/19/2022 0.0  0.0 - 0.2 % Final   Neutrophils Relative % 03/19/2022 71  % Final   Neutro Abs 03/19/2022 8.6 (H)  1.7 - 7.7 K/uL Final   Lymphocytes Relative 03/19/2022 19  % Final   Lymphs Abs 03/19/2022 2.4  0.7 - 4.0 K/uL Final   Monocytes Relative 03/19/2022 8  % Final   Monocytes Absolute 03/19/2022 1.0  0.1 - 1.0 K/uL Final   Eosinophils Relative 03/19/2022 2  % Final   Eosinophils Absolute 03/19/2022 0.3  0.0 - 0.5 K/uL Final   Basophils Relative 03/19/2022 0  % Final   Basophils Absolute 03/19/2022 0.1  0.0 - 0.1 K/uL Final   Immature Granulocytes 03/19/2022 0  % Final   Abs Immature Granulocytes 03/19/2022 0.05  0.00 - 0.07 K/uL Final   Performed at [facility], [place] [address] [place], [place] [id number]   Salicylate Lvl [id number] <7.0 (L)  7.0 - 30.0 mg/dL Final   Performed at [place] [address], [place], [place] [address]  Acetaminophen (Tylenol), Serum 03/19/2022 <10 (L)  10 - 30 ug/mL Final   Comment: (NOTE) Therapeutic concentrations vary significantly. A range of 10-30 ug/mL  may be an effective concentration for many patients. However, some  are best treated at concentrations outside of this range. Acetaminophen concentrations >150 ug/mL at 4 hours after ingestion  and >50 ug/mL at 12 hours after ingestion are often associated with  toxic reactions.  Performed at  [facility], [place] [address] [place], [place] [address]   Admission on 03/09/2022, Discharged on 03/12/2022  Component Date Value Ref Range Status   SARS Coronavirus 2 by RT PCR 03/09/2022 NEGATIVE  NEGATIVE Final   Comment: (NOTE) SARS-CoV-2 target nucleic acids are NOT DETECTED.  The SARS-CoV-2 RNA is generally detectable in upper respiratory specimens during the acute phase of infection. The lowest concentration of SARS-CoV-2 viral copies this assay can detect is 138 copies/mL. A negative result does not preclude SARS-Cov-2 infection and should not be used as the sole basis for treatment or other patient management decisions. A negative result may occur with  improper specimen collection/handling, submission of specimen other than nasopharyngeal swab, presence of viral mutation(s) within the areas targeted by this assay, and inadequate number of viral copies(<138 copies/mL). A negative result must be combined with clinical observations, patient history, and epidemiological information. The expected result is Negative.  Fact Sheet for Patients:  EntrepreneurPulse.com.au  Fact Sheet for Healthcare Providers:  IncredibleEmployment.be  This test is no                          t yet approved or cleared by the [place] FDA and  has been authorized for detection and/or diagnosis of SARS-CoV-2 by FDA under an Emergency Use Authorization (EUA). This EUA will remain  in effect (meaning this test can be used) for the duration of the COVID-19 declaration under Section 564(b)(1) of the Act, 21 U.S.C.section 360bbb-3(b)(1), unless the authorization is terminated  or revoked sooner.       Influenza A by PCR 03/09/2022 NEGATIVE  NEGATIVE Final   Influenza B by PCR 03/09/2022 NEGATIVE  NEGATIVE Final   Comment: (NOTE) The Xpert Xpress SARS-CoV-2/FLU/RSV plus assay is intended as an aid in the diagnosis of influenza from Nasopharyngeal swab  specimens and should not be used as a sole basis for treatment. Nasal washings and aspirates are unacceptable for Xpert Xpress SARS-CoV-2/FLU/RSV testing.  Fact Sheet for Patients: EntrepreneurPulse.com.au  Fact Sheet for Healthcare Providers: IncredibleEmployment.be  This test is not yet approved or cleared by the [place] FDA and has been authorized for detection and/or diagnosis of SARS-CoV-2 by FDA under an Emergency Use Authorization (EUA). This EUA will remain in effect (meaning this test can be used) for the duration of the COVID-19 declaration under Section 564(b)(1) of the Act, 21 U.S.C. section 360bbb-3(b)(1), unless the authorization is terminated or revoked.     Resp Syncytial Virus by PCR 03/09/2022 NEGATIVE  NEGATIVE Final   Comment: (NOTE) Fact Sheet for Patients: EntrepreneurPulse.com.au  Fact Sheet for Healthcare Providers: IncredibleEmployment.be  This test is not yet approved or cleared by the [place] FDA and has been authorized for detection and/or diagnosis of SARS-CoV-2 by FDA under an Emergency Use Authorization (EUA). This EUA will remain in effect (meaning this test can be used) for the duration of the COVID-19 declaration under Section 564(b)(1) of the Act, 21 U.S.C. section 360bbb-3(b)(1), unless the authorization is terminated or revoked.  Performed  at [facility], [place] [address] [place], [place] [address]    WBC 03/09/2022 9.0  4.0 - 10.5 K/uL Final   RBC 03/09/2022 5.28  4.22 - 5.81 MIL/uL Final   Hemoglobin 03/09/2022 16.1  13.0 - 17.0 g/dL Final   HCT 03/09/2022 48.7  39.0 - 52.0 % Final   MCV 03/09/2022 92.2  80.0 - 100.0 fL Final   MCH 03/09/2022 30.5  26.0 - 34.0 pg Final   MCHC 03/09/2022 33.1  30.0 - 36.0 g/dL Final   RDW 03/09/2022 12.5  11.5 - 15.5 % Final   Platelets 03/09/2022 283  150 - 400 K/uL Final   nRBC 03/09/2022 0.0  0.0 - 0.2 %  Final   Neutrophils Relative % 03/09/2022 58  % Final   Neutro Abs 03/09/2022 5.2  1.7 - 7.7 K/uL Final   Lymphocytes Relative 03/09/2022 33  % Final   Lymphs Abs 03/09/2022 3.0  0.7 - 4.0 K/uL Final   Monocytes Relative 03/09/2022 6  % Final   Monocytes Absolute 03/09/2022 0.5  0.1 - 1.0 K/uL Final   Eosinophils Relative 03/09/2022 2  % Final   Eosinophils Absolute 03/09/2022 0.2  0.0 - 0.5 K/uL Final   Basophils Relative 03/09/2022 1  % Final   Basophils Absolute 03/09/2022 0.1  0.0 - 0.1 K/uL Final   Immature Granulocytes 03/09/2022 0  % Final   Abs Immature Granulocytes 03/09/2022 0.03  0.00 - 0.07 K/uL Final   Performed at [facility], [place] [address], [place], [place] [address]   Sodium 03/09/2022 140  135 - 145 mmol/L Final   Potassium 03/09/2022 4.0  3.5 - 5.1 mmol/L Final   Chloride 03/09/2022 102  98 - 111 mmol/L Final   CO2 03/09/2022 30  22 - 32 mmol/L Final   Glucose, Bld 03/09/2022 97  70 - 99 mg/dL Final   Glucose reference range applies only to samples taken after fasting for at least 8 hours.   BUN 03/09/2022 9  6 - 20 mg/dL Final   Creatinine, Ser 03/09/2022 1.03  0.61 - 1.24 mg/dL Final   Calcium 03/09/2022 9.1  8.9 - 10.3 mg/dL Final   Total Protein 03/09/2022 5.6 (L)  6.5 - 8.1 g/dL Final   Albumin 03/09/2022 3.6  3.5 - 5.0 g/dL Final   AST 03/09/2022 17  15 - 41 U/L Final   ALT 03/09/2022 21  0 - 44 U/L Final   Alkaline Phosphatase 03/09/2022 30 (L)  38 - 126 U/L Final   Total Bilirubin 03/09/2022 1.1  0.3 - 1.2 mg/dL Final   GFR, Estimated 03/09/2022 >60  >60 mL/min Final   Comment: (NOTE) Calculated using the CKD-EPI Creatinine Equation (2021)    Anion gap 03/09/2022 8  5 - 15 Final   Performed at [place] [address] [place], [place] [address]   Hgb A1c MFr Bld 03/09/2022 5.5  4.8 - 5.6 % Final   Comment: (NOTE) Pre diabetes:          5.7%-6.4%  Diabetes:              >6.4%  Glycemic control for   <7.0% adults with diabetes     Mean Plasma Glucose 03/09/2022 111.15  mg/dL Final   Performed at [facility], [place] [address], [place], [place] [address]   Magnesium 03/09/2022 2.4  1.7 - 2.4 mg/dL Final   Performed at [place],  [place] [address]   Alcohol, Ethyl (B) 03/09/2022 <10  <10 mg/dL Final   Comment: (NOTE) Lowest detectable limit for serum alcohol is 10 mg/dL.  For medical purposes only. Performed at [facility], [place] [address], [place], [place] [address]    Cholesterol 03/09/2022 179  0 - 200 mg/dL Final   Comment:        ATP III CLASSIFICATION:  <200     mg/dL   Desirable  200-239  mg/dL   Borderline High  >=240    mg/dL   High           Triglycerides 03/09/2022 80  <150 mg/dL Final   HDL 03/09/2022 44  >40 mg/dL Final   Total CHOL/HDL Ratio 03/09/2022 4.1  RATIO Final   VLDL 03/09/2022 16  0 - 40 mg/dL Final   LDL Cholesterol 03/09/2022 119 (H)  0 - 99 mg/dL Final   Comment:        Total Cholesterol/HDL:CHD Risk Coronary Heart Disease Risk Table                     Men   Women  1/2 Average Risk   3.4   3.3  Average Risk       5.0   4.4  2 X Average Risk   9.6   7.1  3 X Average Risk  23.4   11.0        Use the calculated Patient Ratio above and the CHD Risk Table to determine the patient's CHD Risk.        ATP III CLASSIFICATION (LDL):  <100     mg/dL   Optimal  100-129  mg/dL   Near or Above                    Optimal  130-159  mg/dL   Borderline  160-189  mg/dL   High  >190     mg/dL   Very High Performed at [place] [address] [place], [place] [address]    TSH 03/09/2022 1.880  0.350 - 4.500 uIU/mL Final   Comment: Performed by a 3rd Generation assay with a functional sensitivity of <=0.01 uIU/mL. Performed at [facility], [place] [address], [place],  [address]    Neisseria Gonorrhea 03/09/2022 Negative   Final   Chlamydia 03/09/2022 Negative   Final   Comment 03/09/2022 Normal Reference [place] Chlamydia  - Negative   Final   Comment 03/09/2022 Normal Reference Range Neisseria Gonorrhea - Negative   Final   RPR Ser Ql 03/09/2022 NON REACTIVE  NON REACTIVE Final   Performed at [facility], [place] [address], [place], [place] [address]   POC Amphetamine UR 03/09/2022 None Detected  NONE DETECTED (Cut Off Level 1000 ng/mL) Final   POC Secobarbital (BAR) 03/09/2022 None Detected  NONE DETECTED (Cut Off Level 300 ng/mL) Final   POC Buprenorphine (BUP) 03/09/2022 None Detected  NONE DETECTED (Cut Off Level 10 ng/mL) Final   POC Oxazepam (BZO) 03/09/2022 None Detected  NONE DETECTED (Cut Off Level 300 ng/mL) Final   POC Cocaine UR 03/09/2022 Positive (A)  NONE DETECTED (Cut Off Level 300 ng/mL) Final   POC Methamphetamine UR 03/09/2022 None Detected  NONE DETECTED (Cut Off Level 1000 ng/mL) Final   POC Morphine 03/09/2022 None Detected  NONE DETECTED (Cut Off Level 300 ng/mL) Final   POC Methadone UR 03/09/2022 None Detected  NONE DETECTED (Cut Off Level  300 ng/mL) Final   POC Oxycodone UR 03/09/2022 None Detected  NONE DETECTED (Cut Off Level 100 ng/mL) Final   POC Marijuana UR 03/09/2022 Positive (A)  NONE DETECTED (Cut Off Level 50 ng/mL) Final   HIV Screen 4th Generation wRfx 03/09/2022 Non Reactive  Non Reactive Final   Performed at [facility], [place] [address] [place], [place] [address]   SARSCOV2ONAVIRUS 2 AG 03/09/2022 NEGATIVE  NEGATIVE Final   Comment: (NOTE) SARS-CoV-2 antigen NOT DETECTED.   Negative results are presumptive.  Negative results do not preclude SARS-CoV-2 infection and should not be used as the sole basis for treatment or other patient management decisions, including infection  control decisions, particularly in the presence of clinical signs and  symptoms consistent with COVID-19, or in those who have been in contact with the virus.  Negative results must be combined with clinical observations, patient history, and epidemiological information. The expected  result is Negative.  Fact Sheet for Patients: HandmadeRecipes.com.cy  Fact Sheet for Healthcare Providers: FuneralLife.at  This test is not yet approved or cleared by the [place] FDA and  has been authorized for detection and/or diagnosis of SARS-CoV-2 by FDA under an Emergency Use Authorization (EUA).  This EUA will remain in effect (meaning this test can be used) for the duration of  the COV                          ID-19 declaration under Section 564(b)(1) of the Act, 21 U.S.C. section 360bbb-3(b)(1), unless the authorization is terminated or revoked sooner.     Color, Urine 03/10/2022 STRAW (A)  YELLOW Final   APPearance 03/10/2022 CLEAR  CLEAR Final   Specific Gravity, Urine 03/10/2022 1.010  1.005 - 1.030 Final   pH 03/10/2022 7.0  5.0 - 8.0 Final   Glucose, UA 03/10/2022 NEGATIVE  NEGATIVE mg/dL Final   Hgb urine dipstick 03/10/2022 NEGATIVE  NEGATIVE Final   Bilirubin Urine 03/10/2022 NEGATIVE  NEGATIVE Final   Ketones, ur 03/10/2022 NEGATIVE  NEGATIVE mg/dL Final   Protein, ur 03/10/2022 NEGATIVE  NEGATIVE mg/dL Final   Nitrite 03/10/2022 NEGATIVE  NEGATIVE Final   Leukocytes,Ua 03/10/2022 NEGATIVE  NEGATIVE Final   RBC / HPF 03/10/2022 0-5  0 - 5 RBC/hpf Final   WBC, UA 03/10/2022 0-5  0 - 5 WBC/hpf Final   Bacteria, UA 03/10/2022 NONE SEEN  NONE SEEN Final   Squamous Epithelial / HPF 03/10/2022 0-5  0 - 5 /HPF Final   Mucus 03/10/2022 PRESENT   Final   Performed at [facility], [place] [address] [place], [place] [address]    Allergies: Vicks vaporub [[id number]]  Medications:  Facility Ordered Medications  Medication   [COMPLETED] bupivacaine(PF) (MARCAINE) 0.5 % injection 10 mL   [COMPLETED] hydrOXYzine (ATARAX) tablet 25 mg   [COMPLETED] acetaminophen (TYLENOL) tablet 650 mg   [COMPLETED] sertraline (ZOLOFT) tablet 50 mg   [COMPLETED] risperiDONE (RISPERDAL) tablet 1 mg   traZODone  (DESYREL) tablet 50 mg   hydrOXYzine (ATARAX) tablet 25 mg   [START ON 05/01/2022] risperiDONE (RISPERDAL) tablet 1 mg   [START ON 05/01/2022] sertraline (ZOLOFT) tablet 50 mg   potassium chloride SA (KLOR-CON M) CR tablet 20 mEq   PTA Medications  Medication Sig   risperiDONE (RISPERDAL) 1 MG tablet Take 1 tablet (1 mg total) by mouth daily.   hydrOXYzine (ATARAX) 25 MG tablet Take 1 tablet (25 mg total) by mouth 3 (three) times daily as  needed for anxiety.   sertraline (ZOLOFT) 50 MG tablet Take 1 tablet (50 mg total) by mouth daily.   traZODone (DESYREL) 50 MG tablet Take 1 tablet (50 mg total) by mouth at bedtime as needed for sleep.   nicotine (NICODERM CQ - DOSED IN MG/24 HOURS) 14 mg/24hr patch Place 1 patch (14 mg total) onto the skin daily. (Patient not taking: Reported on 04/30/2022)    Medical Decision Making  Bryan Payne was admitted to [facility] continuous assessment unit for cocaine use with cocaine-induced mood disorder, crisis management, and stabilization. Routine labs ordered, which include Lab Orders         TSH         Hemoglobin A1c         Lipid panel         Prolactin    Medication Management: Medications started, continue home medications as appropriate Meds ordered this encounter  Medications   traZODone (DESYREL) tablet 50 mg   hydrOXYzine (ATARAX) tablet 25 mg   risperiDONE (RISPERDAL) tablet 1 mg   sertraline (ZOLOFT) tablet 50 mg   potassium chloride SA (KLOR-CON M) CR tablet 20 mEq    Will maintain observation checks every 15 minutes for safety. Potassium chloride SA 20 mEq one time dose for potassium level of 3.3 Repeat EKG 05/01/22 0800 to monitor QTC interval   Recommendations  Based on my evaluation the patient does not appear to have an emergency medical condition.  -Recommend admission to continuous observation unit overnight for medication administration and safety monitoring with reevaluation tomorrow morning  (05/01/22).   Hezzie Bump, NP 04/30/22  8:00 PM

## 2022-04-30 NOTE — ED Notes (Signed)
Pt A&O x 4, no distress noted, sleeping at present, SI with auditory hallucinations.  Contracts for safety.  Monitoring for safety.

## 2022-05-01 NOTE — ED Notes (Signed)
Pt sleeping at present, no distress noted.  Monitoring for safety. 

## 2022-05-01 NOTE — Discharge Instructions (Addendum)
Please come to [facility] (this facility, SECOND FLOOR) during walk in hours for appointment with psychiatrist/provider for further medication management and for therapists for therapy.   Walk in hours for therapy/counseling: Monday through Thursday 7:30AM until slots are full. Every Friday 12PM until slots are full.  Walk in hours for psychiatry/medication management: Monday through Friday 7:30AM until slots are full.   When you arrive please take the elevators upstairs. If you are unsure of where to go, inform the front desk that you are here for open access hours and they will assist you with directions upstairs.  Walk ins spots are limited and are seen first come, first served. YOU MAY NOT BE SEEN THE SAME DAY YOU ARRIVE. To increase the likelihood of being seen the same day, please arrive early, such as by 7:15AM.  Address:  [address], [place], in [place], [place] Ph: [phone]

## 2022-05-01 NOTE — ED Notes (Signed)
Patient alert and oriented x 3. Denies SI/HI/AVH. Denies intent or plan to harm self or others. Routine conducted according to faculty protocol. Encourage patient to notify staff with any needs or concerns. Patient verbalized agreement and understanding. Will continue to monitor for safety. 

## 2022-05-01 NOTE — ED Provider Notes (Signed)
FBC/OBS ASAP Discharge Summary  Date and Time: 05/01/2022 11:28 AM  Name: Bryan Payne  MRN:  [id number]   Discharge Diagnoses:  Final diagnoses:  Cocaine use with cocaine-induced mood disorder ([place])   Subjective:   On reassessment today, pt is requesting discharge. He denies SI/VI/HI, AVH, paranoia. He reports initially presenting to [facility] "for my thumb, got frustrated at myself, I'm homeless, nowhere to go". Pt reports he utilizes [facility] as needed. He states [facility] but [facility], does not know the reason why. He is deny SI/VI/HI, AVH, paranoia. He asks for medication samples and discharge, as well as outpatient services.  Stay Summary:  Pt is a 27 y/o male w/ history of polysubstance abuse, MDD, Adjustment disorder with disturbance of emotion, substance induced disorder, schizophrenia, presenting to [facility] as a direct transfer from [place] on 05/01/22. His home medications of risperdal 1mg  and zoloft 50mg  were restarted. Pt denies SI/VI/HI, AVH, paranoia on reassessment and is requesting discharge with medication samples and outpatient services.  Total Time spent with patient: 30 minutes  Past Psychiatric History: Polysubstance abuse, MDD, Adjustment disorder with disturbance of emotion, substance induced mood disorder, schizophrenia Past Medical History: None reported Family History: None reported Family Psychiatric History: None reported Social History: Homeless, unemployed Tobacco Cessation:  A prescription for an FDA-approved tobacco cessation medication was offered at discharge and the patient refused  Current Medications:  Current Facility-Administered Medications  Medication Dose Route Frequency Provider Last Rate Last Admin   hydrOXYzine (ATARAX) tablet 25 mg  25 mg Oral TID PRN Hezzie Bump, NP   25 mg at 04/30/22 2138   risperiDONE (RISPERDAL) tablet 1 mg  1 mg Oral Daily Asencion Islam E, NP   1 mg at 05/01/22 [id number]   sertraline (ZOLOFT)  tablet 50 mg  50 mg Oral Daily Hezzie Bump, NP   50 mg at 05/01/22 0926   traZODone (DESYREL) tablet 50 mg  50 mg Oral QHS PRN Hezzie Bump, NP   50 mg at 04/30/22 2138   Current Outpatient Medications  Medication Sig Dispense Refill   hydrOXYzine (ATARAX) 25 MG tablet Take 1 tablet (25 mg total) by mouth 3 (three) times daily as needed for anxiety. 90 tablet 1   nicotine (NICODERM CQ - DOSED IN MG/24 HOURS) 14 mg/24hr patch Place 1 patch (14 mg total) onto the skin daily. (Patient not taking: Reported on 04/30/2022) 28 patch 0   risperiDONE (RISPERDAL) 1 MG tablet Take 1 tablet (1 mg total) by mouth daily. 30 tablet 1   sertraline (ZOLOFT) 50 MG tablet Take 1 tablet (50 mg total) by mouth daily. 30 tablet 1   traZODone (DESYREL) 50 MG tablet Take 1 tablet (50 mg total) by mouth at bedtime as needed for sleep. 30 tablet 1    PTA Medications:  PTA Medications  Medication Sig   risperiDONE (RISPERDAL) 1 MG tablet Take 1 tablet (1 mg total) by mouth daily.   hydrOXYzine (ATARAX) 25 MG tablet Take 1 tablet (25 mg total) by mouth 3 (three) times daily as needed for anxiety.   sertraline (ZOLOFT) 50 MG tablet Take 1 tablet (50 mg total) by mouth daily.   traZODone (DESYREL) 50 MG tablet Take 1 tablet (50 mg total) by mouth at bedtime as needed for sleep.   nicotine (NICODERM CQ - DOSED IN MG/24 HOURS) 14 mg/24hr patch Place 1 patch (14 mg total) onto the skin daily. (Patient not taking: Reported on 04/30/2022)  Facility Ordered Medications  Medication   [COMPLETED] bupivacaine(PF) (MARCAINE) 0.5 % injection 10 mL   [COMPLETED] hydrOXYzine (ATARAX) tablet 25 mg   [COMPLETED] acetaminophen (TYLENOL) tablet 650 mg   [COMPLETED] sertraline (ZOLOFT) tablet 50 mg   [COMPLETED] risperiDONE (RISPERDAL) tablet 1 mg   traZODone (DESYREL) tablet 50 mg   hydrOXYzine (ATARAX) tablet 25 mg   risperiDONE (RISPERDAL) tablet 1 mg   sertraline (ZOLOFT) tablet 50 mg   [COMPLETED] potassium chloride  SA (KLOR-CON M) CR tablet 20 mEq       03/11/2022   12:17 PM 03/09/2022    8:25 AM 05/23/2021    9:06 AM  Depression screen PHQ 2/9  Decreased Interest 1 1 1   Down, Depressed, Hopeless 2 1 0  PHQ - 2 Score 3 2 1   Altered sleeping 3 2 1   Tired, decreased energy 1 1 1   Change in appetite 3 3 1   Feeling bad or failure about yourself  2 3 0  Trouble concentrating 1 1 0  Moving slowly or fidgety/restless 2 3 0  Suicidal thoughts 1 2 0  PHQ-9 Score 16 17 4   Difficult doing work/chores Very difficult Very difficult Somewhat difficult    Flowsheet Row ED from 04/30/2022 in [facility] ED from 04/29/2022 in [facility] Emergency Department at [facility] ED from 04/13/2022 in [facility] Emergency Department at [facility] Error: Q3, 4, or 5 should not be populated when Q2 is No Moderate Risk Error: Question 1 not populated       Musculoskeletal  Strength & Muscle Tone: within normal limits Gait & Station: normal Patient leans: N/A  Psychiatric Specialty Exam  Presentation  General Appearance:  Appropriate for Environment; Other (comment) (dressed in hospital scrubs)  Eye Contact: Fair  Speech: Clear and Coherent; Normal Rate  Speech Volume: Normal  Handedness: Right   Mood and Affect  Mood: Euthymic  Affect: Blunt   Thought Process  Thought Processes: Coherent; Goal Directed; Linear  Descriptions of Associations:Intact  Orientation:Full (Time, Place and Person)  Thought Content:Logical; WDL  Diagnosis of Schizophrenia or Schizoaffective disorder in past: Yes  Duration of Psychotic Symptoms: Greater than six months   Hallucinations:Hallucinations: None  Ideas of Reference:None  Suicidal Thoughts:Suicidal Thoughts: No  Homicidal Thoughts:Homicidal Thoughts: No   Sensorium  Memory: Immediate Fair  Judgment: Fair  Insight: Fair   Community education officer   Concentration: Fair  Attention Span: Fair  Recall: [employer] of Knowledge: Fair  Language: Fair   Psychomotor Activity  Psychomotor Activity: Psychomotor Activity: Normal   Assets  Assets: Communication Skills; Desire for Improvement; Financial Resources/Insurance; Resilience; Physical Health   Sleep  Sleep: Sleep: Good Number of Hours of Sleep: 9   Nutritional Assessment (For OBS and FBC admissions only) Has the patient had a weight loss or gain of 10 pounds or more in the last 3 months?: No Has the patient had a decrease in food intake/or appetite?: No Does the patient have dental problems?: No Does the patient have eating habits or behaviors that may be indicators of an eating disorder including binging or inducing vomiting?: No Has the patient recently lost weight without trying?: 0 Has the patient been eating poorly because of a decreased appetite?: 0 Malnutrition Screening Tool Score: 0    Physical Exam  Physical Exam Constitutional:      General: He is not in acute distress.    Appearance: He is not ill-appearing, toxic-appearing or diaphoretic.  Eyes:     General: No scleral icterus. Cardiovascular:     Rate and Rhythm: Normal rate.  Pulmonary:     Effort: Pulmonary effort is normal. No respiratory distress.  Skin:    General: Skin is warm and dry.  Neurological:     Mental Status: He is alert and oriented to person, place, and time.  Psychiatric:        Attention and Perception: Attention and perception normal.        Mood and Affect: Mood normal. Affect is blunt.        Speech: Speech normal.        Behavior: Behavior normal. Behavior is cooperative.        Thought Content: Thought content normal.        Cognition and Memory: Cognition and memory normal.    Review of Systems  Constitutional:  Negative for chills and fever.  Respiratory:  Negative for shortness of breath.   Cardiovascular:  Negative for chest pain and palpitations.   Gastrointestinal:  Negative for abdominal pain.  Neurological:  Negative for headaches.   Blood pressure 107/60, pulse 64, temperature 98.3 F (36.8 C), temperature source Oral, resp. rate 18, SpO2 100 %. There is no height or weight on file to calculate BMI.  Demographic Factors:  Male, Low socioeconomic status, Living alone, and Unemployed  Loss Factors: NA  Historical Factors: NA  Risk Reduction Factors:   NA  Continued Clinical Symptoms:  Previous Psychiatric Diagnoses and Treatments  Cognitive Features That Contribute To Risk:  None    Suicide Risk:  Minimal: No identifiable suicidal ideation.  Patients presenting with no risk factors but with morbid ruminations; may be classified as minimal risk based on the severity of the depressive symptoms  Plan Of Care/Follow-up recommendations:  Follow up with outpatient behavioral health services  Disposition:  Discharge  Tharon Aquas, NP 05/01/2022, 11:28 AM

## 2022-05-01 NOTE — ED Notes (Signed)
Patient A&O x 4, ambulatory. Patient discharged in no acute distress. Patient denied SI/HI, A/VH upon discharge. Patient verbalized understanding of all discharge instructions explained by staff, to include follow up appointments, RX's and safety plan  Pt belongings returned to patient from locker #29  intact. Patient escorted to lobby via staff for transport to destination. Safety maintained. Patient was given 7 day supply of medication  bus pass

## 2022-05-05 ENCOUNTER — Emergency Department (HOSPITAL_COMMUNITY)
Admission: EM | Admit: 2022-05-05 | Discharge: 2022-05-05 | Disposition: A | Discharge status: Home / Self Care | Payer: Medicaid Other | Attending: Emergency Medicine | Admitting: Emergency Medicine

## 2022-05-05 ENCOUNTER — Other Ambulatory Visit: Payer: Self-pay

## 2022-05-05 ENCOUNTER — Encounter (HOSPITAL_COMMUNITY): Payer: Self-pay

## 2022-05-05 DIAGNOSIS — F1910 Other psychoactive substance abuse, uncomplicated: Secondary | ICD-10-CM | POA: Diagnosis not present

## 2022-05-05 DIAGNOSIS — F17210 Nicotine dependence, cigarettes, uncomplicated: Secondary | ICD-10-CM | POA: Insufficient documentation

## 2022-05-05 DIAGNOSIS — R4182 Altered mental status, unspecified: Secondary | ICD-10-CM | POA: Diagnosis present

## 2022-05-05 LAB — CBC WITH DIFFERENTIAL/PLATELET
Abs Immature Granulocytes: 0.01 10*3/uL (ref 0.00–0.07)
Basophils Absolute: 0.1 10*3/uL (ref 0.0–0.1)
Basophils Relative: 1 %
Eosinophils Absolute: 0.2 10*3/uL (ref 0.0–0.5)
Eosinophils Relative: 3 %
HCT: 47.6 % (ref 39.0–52.0)
Hemoglobin: 15.4 g/dL (ref 13.0–17.0)
Immature Granulocytes: 0 %
Lymphocytes Relative: 29 %
Lymphs Abs: 2.3 10*3/uL (ref 0.7–4.0)
MCH: 30.4 pg (ref 26.0–34.0)
MCHC: 32.4 g/dL (ref 30.0–36.0)
MCV: 94.1 fL (ref 80.0–100.0)
Monocytes Absolute: 0.6 10*3/uL (ref 0.1–1.0)
Monocytes Relative: 7 %
Neutro Abs: 4.7 10*3/uL (ref 1.7–7.7)
Neutrophils Relative %: 60 %
Platelets: 337 10*3/uL (ref 150–400)
RBC: 5.06 MIL/uL (ref 4.22–5.81)
RDW: 12.8 % (ref 11.5–15.5)
WBC: 7.9 10*3/uL (ref 4.0–10.5)
nRBC: 0 % (ref 0.0–0.2)

## 2022-05-05 LAB — COMPREHENSIVE METABOLIC PANEL
ALT: 37 U/L (ref 0–44)
AST: 37 U/L (ref 15–41)
Albumin: 4.2 g/dL (ref 3.5–5.0)
Alkaline Phosphatase: 45 U/L (ref 38–126)
Anion gap: 9 (ref 5–15)
BUN: 16 mg/dL (ref 6–20)
CO2: 23 mmol/L (ref 22–32)
Calcium: 8.1 mg/dL — ABNORMAL LOW (ref 8.9–10.3)
Chloride: 108 mmol/L (ref 98–111)
Creatinine, Ser: 0.81 mg/dL (ref 0.61–1.24)
GFR, Estimated: >60 mL/min (ref >60)
Glucose, Bld: 81 mg/dL (ref 70–99)
Potassium: 3.8 mmol/L (ref 3.5–5.1)
Sodium: 140 mmol/L (ref 135–145)
Total Bilirubin: 0.8 mg/dL (ref 0.3–1.2)
Total Protein: 6.5 g/dL (ref 6.5–8.1)

## 2022-05-05 LAB — RAPID URINE DRUG SCREEN, HOSP PERFORMED
Amphetamines: NOT DETECTED
Barbiturates: NOT DETECTED
Benzodiazepines: NOT DETECTED
Cocaine: POSITIVE — AB
Opiates: NOT DETECTED
Tetrahydrocannabinol: POSITIVE — AB

## 2022-05-05 LAB — ETHANOL: Alcohol, Ethyl (B): <10 mg/dL (ref <10)

## 2022-05-05 MED ORDER — SODIUM CHLORIDE 0.9 % IV BOLUS
1000.0000 mL | Freq: Once | INTRAVENOUS | Status: AC
  Administered 2022-05-05: 1000 mL via INTRAVENOUS

## 2022-05-05 NOTE — ED Provider Notes (Signed)
Signout from Dr. Florina Ou.  27 year old male found by police in the woods after using multiple substances in order to get high.  Lab work has been fairly unremarkable.  Plan is to reassess patient when more awake. Physical Exam  BP 131/65 (BP Location: Right Arm)   Pulse 88   Temp (!) 97.5 F (36.4 C) (Oral)   Resp (!) 25   SpO2 100%   Physical Exam  Procedures  Procedures  ED Course / MDM    Medical Decision Making Amount and/or Complexity of Data Reviewed Labs: ordered. ECG/medicine tests: ordered.   8:40 AM.  Patient has eaten breakfast is resting comfortably in bed.  He is able to carry on a conversation.  He said there is nobody can come pick him up.  Asked the nurse to see if we can get him up and ambulate him       Hayden Rasmussen, MD 05/05/22 [phone]

## 2022-05-05 NOTE — ED Provider Notes (Signed)
[facility] DEPT Provider Note: Georgena Spurling, MD, FACEP  CSN: [id number] MRN: [id number] ARRIVAL: 05/05/22 at [place]  Ingestion  Level 5 caveat: Altered mental status HISTORY OF PRESENT ILLNESS  05/05/22 5:31 AM Bryan Payne is a 27 y.o. male who was found by police sleeping in the woods and brought to the ED due to altered mental status.  The patient admits to taking 5 tabs of LSD, 12 Benadryl tablets, alcohol and admits to cocaine yesterday.  He states he was doing this to get high.  He has a wound to his dorsal right thumb that was sutured on 04/29/2022 but he admits he has not taken good care of the wound and it has not fully healed.  He also admits to having soiled his trousers prior to arrival.   Past Medical History:  Diagnosis Date   Homeless    Knee derangement 04/2013   left   Lateral meniscus tear 04/2013   left knee    Past Surgical History:  Procedure Laterality Date   APPENDECTOMY     KNEE ARTHROSCOPY WITH LATERAL MENISECTOMY Left 05/05/2013   Procedure: LEFT KNEE ARTHROSCOPY WITH LATERAL MENISCAL REPAIR;  Surgeon: Johnny Bridge, MD;  Location: [facility];  Service: Orthopedics;  Laterality: Left;   LAPAROSCOPIC APPENDECTOMY Right 10/13/2012   Procedure: APPENDECTOMY LAPAROSCOPIC;  Surgeon: Edward Jolly, MD;  Location: [facility];  Service: General;  Laterality: Right;    Family History  Problem Relation Age of Onset   Kidney failure Father        Renal transplant   Diabetes Other        Family on father's side    Social History   Tobacco Use   Smoking status: Every Day    Packs/day: .5    Types: Cigars, Cigarettes    Passive exposure: Current   Smokeless tobacco: Never  Vaping Use   Vaping Use: Never used  Substance Use Topics   Alcohol use: Yes    Comment: "often"   Drug use: Yes    Types: Marijuana, Methamphetamines, Cocaine    Comment: daily, hallucinagenics    Prior to Admission  medications   Medication Sig Start Date End Date Taking? Authorizing Provider  hydrOXYzine (ATARAX) 25 MG tablet Take 1 tablet (25 mg total) by mouth 3 (three) times daily as needed for anxiety. 03/11/22   Revonda Humphrey, NP  nicotine (NICODERM CQ - DOSED IN MG/24 HOURS) 14 mg/24hr patch Place 1 patch (14 mg total) onto the skin daily. Patient not taking: Reported on 04/30/2022 03/22/22   Marissa Calamity, NP  risperiDONE (RISPERDAL) 1 MG tablet Take 1 tablet (1 mg total) by mouth daily. 03/12/22   Revonda Humphrey, NP  sertraline (ZOLOFT) 50 MG tablet Take 1 tablet (50 mg total) by mouth daily. 03/11/22   Revonda Humphrey, NP  traZODone (DESYREL) 50 MG tablet Take 1 tablet (50 mg total) by mouth at bedtime as needed for sleep. 03/11/22   Revonda Humphrey, NP    Allergies Vicks vaporub [[id number]-men-[id number]]   REVIEW OF SYSTEMS  Level 5 caveat   PHYSICAL EXAMINATION  Initial Vital Signs Blood pressure 135/77, pulse 62, resp. rate 19, SpO2 100 %.  Examination General: Well-developed, well-nourished male in no acute distress; appearance consistent with age of record HENT: normocephalic; atraumatic Eyes: pupils equal, round and reactive to light; extraocular muscles grossly intact Neck: supple Heart: regular rate and rhythm Lungs: clear to  auscultation bilaterally Abdomen: soft; nondistended; nontender; bowel sounds present Extremities: No deformity; full range of motion; pulses normal Neurologic: Awake, lethargic; dysarthria; ataxia; motor function intact in all extremities and symmetric; no facial droop Skin: Warm and dry; poorly healing sutured wound of dorsal right thumb without signs of infection Psychiatric: Flat affect; denies SI   RESULTS  Summary of this visit's results, reviewed and interpreted by myself:   EKG Interpretation  Date/Time:  Tuesday May 05 2022 05:27:03 EDT Ventricular Rate:  64 PR Interval:  173 QRS Duration: 91 QT Interval:  443 QTC  Calculation: 458 R Axis:   79 Text Interpretation: Sinus rhythm No significant change was found Confirmed by Shanon Rosser [phone]) on 05/05/2022 5:58:59 AM       Laboratory Studies: Results for orders placed or performed during the hospital encounter of 05/05/22 (from the past 24 hour(s))  CBC with Differential     Status: None   Collection Time: 05/05/22  5:31 AM  Result Value Ref Range   WBC 7.9 4.0 - 10.5 K/uL   RBC 5.06 4.22 - 5.81 MIL/uL   Hemoglobin 15.4 13.0 - 17.0 g/dL   HCT 47.6 39.0 - 52.0 %   MCV 94.1 80.0 - 100.0 fL   MCH 30.4 26.0 - 34.0 pg   MCHC 32.4 30.0 - 36.0 g/dL   RDW 12.8 11.5 - 15.5 %   Platelets 337 150 - 400 K/uL   nRBC 0.0 0.0 - 0.2 %   Neutrophils Relative % 60 %   Neutro Abs 4.7 1.7 - 7.7 K/uL   Lymphocytes Relative 29 %   Lymphs Abs 2.3 0.7 - 4.0 K/uL   Monocytes Relative 7 %   Monocytes Absolute 0.6 0.1 - 1.0 K/uL   Eosinophils Relative 3 %   Eosinophils Absolute 0.2 0.0 - 0.5 K/uL   Basophils Relative 1 %   Basophils Absolute 0.1 0.0 - 0.1 K/uL   Immature Granulocytes 0 %   Abs Immature Granulocytes 0.01 0.00 - 0.07 K/uL  Comprehensive metabolic panel     Status: Abnormal   Collection Time: 05/05/22  5:31 AM  Result Value Ref Range   Sodium 140 135 - 145 mmol/L   Potassium 3.8 3.5 - 5.1 mmol/L   Chloride 108 98 - 111 mmol/L   CO2 23 22 - 32 mmol/L   Glucose, Bld 81 70 - 99 mg/dL   BUN 16 6 - 20 mg/dL   Creatinine, Ser 0.81 0.61 - 1.24 mg/dL   Calcium 8.1 (L) 8.9 - 10.3 mg/dL   Total Protein 6.5 6.5 - 8.1 g/dL   Albumin 4.2 3.5 - 5.0 g/dL   AST 37 15 - 41 U/L   ALT 37 0 - 44 U/L   Alkaline Phosphatase 45 38 - 126 U/L   Total Bilirubin 0.8 0.3 - 1.2 mg/dL   GFR, Estimated >60 >60 mL/min   Anion gap 9 5 - 15  Ethanol     Status: None   Collection Time: 05/05/22  5:32 AM  Result Value Ref Range   Alcohol, Ethyl (B) <10 <10 mg/dL  Rapid urine drug screen (hospital performed)     Status: Abnormal   Collection Time: 05/05/22  5:32 AM   Result Value Ref Range   Opiates NONE DETECTED NONE DETECTED   Cocaine POSITIVE (A) NONE DETECTED   Benzodiazepines NONE DETECTED NONE DETECTED   Amphetamines NONE DETECTED NONE DETECTED   Tetrahydrocannabinol POSITIVE (A) NONE DETECTED   Barbiturates NONE DETECTED NONE DETECTED  Imaging Studies: No results found.  ED COURSE and MDM  Nursing notes, initial and subsequent vitals signs, including pulse oximetry, reviewed and interpreted by myself.  Vitals:   05/05/22 0530 05/05/22 0537 05/05/22 0550  BP: 135/77    Pulse: 62    Resp: 19    Temp: 98.2 F (36.8 C) 98.2 F (36.8 C)   TempSrc:  Oral   SpO2: 100%  100%   Medications  sodium chloride 0.9 % bolus 1,000 mL (1,000 mLs Intravenous New Bag/Given 05/05/22 0538)   Patient placed on seizure precautions and administered a liter of IV fluids.  Patient will need to be observed until he is sober enough to make rational decisions.  6:56 AM Signed out to Dr. Melina Copa.   PROCEDURES  Procedures   ED DIAGNOSES     ICD-10-CM   1. Polysubstance abuse ([place])  F19.10          Shankar Silber, Jenny Reichmann, MD 05/05/22 [phone]

## 2022-05-05 NOTE — ED Notes (Signed)
Patient given snack bag and bus pass at discharge.

## 2022-05-05 NOTE — ED Notes (Signed)
Patient able to ambulate with out complaints of SOB or any respiratory distress, O2 Sats stayed in upper 90s, he denies dizziness, N/V.

## 2022-05-05 NOTE — ED Triage Notes (Signed)
Patient came in complaining of finger pain from where the stitches are and stating that he took 10 benadryl, some lsd and smoked cocaine. Patient is sleepy during triage, patient urinated on himself prior to coming in. Urinal in place to catch urine.

## 2022-07-08 ENCOUNTER — Ambulatory Visit (HOSPITAL_COMMUNITY)
Admission: EM | Admit: 2022-07-08 | Discharge: 2022-07-08 | Disposition: A | Discharge status: Home / Self Care | Payer: Medicaid Other | Attending: Registered Nurse | Admitting: Registered Nurse

## 2022-07-08 ENCOUNTER — Encounter (HOSPITAL_COMMUNITY): Payer: Self-pay | Admitting: Registered Nurse

## 2022-07-08 DIAGNOSIS — Z760 Encounter for issue of repeat prescription: Secondary | ICD-10-CM | POA: Insufficient documentation

## 2022-07-08 DIAGNOSIS — F209 Schizophrenia, unspecified: Secondary | ICD-10-CM | POA: Insufficient documentation

## 2022-07-08 DIAGNOSIS — Z79899 Other long term (current) drug therapy: Secondary | ICD-10-CM | POA: Insufficient documentation

## 2022-07-08 MED ORDER — SERTRALINE HCL 50 MG PO TABS: 50.0000 mg | ORAL_TABLET | Freq: Every day | ORAL | 0 refills | Status: AC

## 2022-07-08 MED ORDER — TRAZODONE HCL 50 MG PO TABS: 50.0000 mg | ORAL_TABLET | Freq: Every evening | ORAL | 0 refills | Status: AC | PRN

## 2022-07-08 MED ORDER — HYDROXYZINE HCL 25 MG PO TABS: 25.0000 mg | ORAL_TABLET | Freq: Three times a day (TID) | ORAL | 0 refills | Status: AC | PRN

## 2022-07-08 MED ORDER — RISPERIDONE 1 MG PO TABS: 1.0000 mg | ORAL_TABLET | Freq: Every day | ORAL | 0 refills | Status: AC

## 2022-07-08 NOTE — Discharge Instructions (Signed)
[facility]: Outpatient psychiatric Services:   Please see the walk in hours listed below.  Medication Management New Patient needing Medication Management Walk-in, and Existing Patients needing to see a provider for management coming as a walk in   Monday thru Friday 8:00 AM first come first serve until slots are full.  Recommend being there by 7:15 AM to ensure a slot is open.  Therapy New Patient Therapy Intake and Existing Patients needing to see therapist coming in as a walk in.   Monday, Wednesday, and Thursday morning at 8:00 am first come first serve.  Recommend being there by 7:15 AM to ensure a slot is open.    Every 1st, 2nd, and 3rd Friday at 1:00 PM first come first serve until slots are full.  Will still need to come in that morning at 7:15 AM to get registered for an afternoon slot.  For all walk-ins we ask that you arrive by 7:15 am because patients will be seen in there order of arrival (FIRST COME FIRST SERVE) Availability is limited, therefore you may not be seen on the same day that you walk in if all slots are full.    Our goal is to serve and meet the needs of our community to the best of our ability.    [facility] Address: [address], [place], [place] [address] Phone: [phone]  Supported Employment The supported employment program is a person-centered, individualized, evidence-based support service that helps members choose, acquire, and maintain competitive employment in our community. This service supports the varying needs of individuals and promotes community inclusion and employment success. Members enrolled in the supported employment program can expect the following:  Development of an individual career plan Community based job placement Job shadowing Job development On-site job training Educational goal planning and support  Supported Education Supported education helps our members receive the education and training  they need to achieve their learning and recovery goals. This will assist members with becoming gainfully employed in the job or career of their choice. The program includes assistance with: Registering for disability accommodations Enrolling in school and registering for classes Learning communication skills Scheduling tutoring sessions within your school [facility] partners with [employer] to help increase the success of clients seeking employment and educational goals.  Want to learn more about our programs?   Please contact our intake department INTAKE: [phone] Ext [phone]  Mailing: [address] [address]   [place], [place] [address]   [email or web address]   [facility]  Hours Monday - Friday: Services: 8:00AM - 3:00PM Offices: 8:00AM - 5:00PM  Physical Address [address] [place], [place] [address]   Please use this address for IRC Mailing Address [address] [address] [place], [place] [address]  The IRC helps people reconnect This is a safe place to rest, take care of basic needs and access the services and community that make all the difference. Our guests come to the IRC to take a class, do laundry, meet with a case manager or to get their mail. Sometimes they just need to sit in our dayroom and enjoy a conversation.  Here you will find everything from shower facilities to a computer lab, a mail room, classrooms and meeting spaces.  The IRC helps people reconnect with their own lives and with the community at large.  A caring community setting One of the most exciting aspects of the IRC is that so many individuals and organizations in the community are a part   of the everyday experience. Whether it's a hair stylist or law firm offering services right in-house, our partners make the IRC a truly interactive resource center where services are brought to our guests. The IRC brings together a comprehensive community of talented people who not only want to help  solve problems, but also to be a part of our guests' lives.  Integrated Care We take a person-centered approach to assistance that includes: Case management [facility] Mental health nurse Referrals  Fundamental Services We start with necessities: Showers and hygiene supplies Laundry Phone access Mailing addresses and mailboxes Replacement IDs Onsite barbershop Storage lockers [place] winter warming center  Self-Sufficiency We connect our guests with: Skilled trade classes Job skills classes Resume and jobs application assistance Interview training GED classes Professional clothing vouchers Financial literacy 

## 2022-07-08 NOTE — Progress Notes (Signed)
   07/08/22 1513  [facility] Triage Screening (Walk-ins at [facility] only)  How Did You Hear About [place]? Self  What Is the Reason for Your Visit/Call Today? Pt is a 27 yo male who presents voluntarily to [facility] requesting a prescription for his medications. Pt reports that he has hx of schizophrenia. Pt reports that he was in jail for the past two months and recently was released 3-4 days ago. Pt reports that he is in need of his medications. Pt denies SI, HI, and AVH. Pt is currently not connected to any outpatient services.  How Long Has This Been Causing You Problems? 1 wk - 1 month  Have You Recently Had Any Thoughts About Hurting Yourself? No  Are You Planning to Commit Suicide/Harm Yourself At This time? No  Have you Recently Had Thoughts About Hurting Someone Karolee Ohs? No  Are You Planning To Harm Someone At This Time? No  Are you currently experiencing any auditory, visual or other hallucinations? No  Have You Used Any Alcohol or Drugs in the Past 24 Hours? No  Do you have any current medical co-morbidities that require immediate attention? No  Clinician description of patient physical appearance/behavior: Pt was casually dressed and groomed appropriately. Pt was alert and cooperative. Pt is dressed unremarkably, alert, oriented x4 with normal speech and normal motor behavior. Eye contact is good. Pt's mood is depressed, and affect is anxious. Thought process is coherent and relevant. Pt's insight is fair and judgement is poor. There is no indication pt is currently responding to internal stimuli or experiencing delusional thought content. Pt was cooperative throughout assessment.  What Do You Feel Would Help You the Most Today? Medication(s)  If access to [facility] was not available, would you have sought care in the Emergency Department? Yes  Determination of Need Routine (7 days)  Options For Referral Medication Management    Flowsheet Row ED from 07/08/2022 in [facility] ED from 05/05/2022 in [facility] Emergency Department at [facility] ED from 04/30/2022 in [facility]  C-SSRS RISK CATEGORY No Risk No Risk Error: Q3, 4, or 5 should not be populated when Q2 is No

## 2022-07-08 NOTE — ED Provider Notes (Signed)
Behavioral Health Urgent Care Medical Screening Exam  Patient Name: Bryan Payne MRN: [id number] Date of Evaluation: 07/08/22 Chief Complaint:   Diagnosis:  Final diagnoses:  Schizophrenia, unspecified type (HCC)    History of Present illness: Bryan Payne is a 27 y.o. male patient presented to [facility] as a walk in with complaints of needing medication refills  Elayne Snare, 27 y.o., male patient seen face to face by this provider, consulted with Dr. Gretta Cool; and chart reviewed on 07/08/22.  On evaluation Bryan Payne reports he was released from jail 2 to 3 days ago.  Reports while he was in jail for 2 months he was taking trazodone 50 mg for sleep, Resporal 1 mg twice a day, Zoloft 50 mg daily, and Vistaril 25 mg as needed and can take up to 50.  Reports medication made him feel did not controlled and he needs to get back on.  Reports she does not have any outpatient psychiatric services for medication management at this time but has an appointment with his primary care doctor on 07/13/2022.  Patient reports before going to jail he had ACTT services but he will need to call and speak with Caryn Bee because he was so the last time he was seen prior to jail that he had be available to be seen.  Patient states that he is currently living with his mother but in order to stay there he has to be back on his medications.  At this time patient denies suicidal/self-harm/homicidal ideations, psychosis, paranoia.  Patient reports he needs to be restarted on his medications so he will be able to work.  States his plans are to get a job as soon as possible, take his medications, and "chill" at home. During evaluation Bryan Payne is sitting in chair with no noted distress.  He is alert/oriented x 4, calm, cooperative, attentive, and responses were relevant and appropriate to assessment questions.  He spoke in a clear tone at moderate volume, and normal pace, with good eye contact.   He denies  suicidal/self-harm/homicidal ideation, psychosis, and paranoia.  Objectively:  there is no evidence of psychosis/mania or delusional thinking.  He conversed coherently, with goal directed thoughts, and no distractibility, or pre-occupation.  At this time Bryan Payne is educated and verbalizes understanding of mental health resources and other crisis services in the community. He is instructed to call 911 and present to the nearest emergency room should he experience any suicidal/homicidal ideation, auditory/visual/hallucinations, or detrimental worsening of his/her mental health condition.  Referred to [facility] for medication management.  Community and psychiatric services resources given.    Flowsheet Row ED from 07/08/2022 in [facility] ED from 05/05/2022 in [facility] Emergency Department at [facility] ED from 04/30/2022 in [facility]  C-SSRS RISK CATEGORY No Risk No Risk Error: Q3, 4, or 5 should not be populated when Q2 is No       Psychiatric Specialty Exam  Presentation  General Appearance:Appropriate for Environment; Casual  Eye Contact:Good  Speech:Clear and Coherent; Normal Rate  Speech Volume:Normal  Handedness:Right   Mood and Affect  Mood: Anxious  Affect: Appropriate; Congruent   Thought Process  Thought Processes: Coherent; Goal Directed  Descriptions of Associations:Intact  Orientation:Full (Time, Place and Person)  Thought Content:Logical  Diagnosis of Schizophrenia or Schizoaffective disorder in past: Yes  Duration of Psychotic Symptoms: Greater than six months  Hallucinations:None  Ideas of Reference:None  Suicidal  Thoughts:No Without Intent; Without Plan  Homicidal Thoughts:No   Sensorium  Memory: Immediate Good; Recent Good; Remote Good  Judgment: Intact  Insight: Present   Executive Functions  Concentration: Good  Attention  Span: Good  Recall: Good  Fund of Knowledge: Good  Language: Good   Psychomotor Activity  Psychomotor Activity: Normal   Assets  Assets: Communication Skills; Desire for Improvement; Physical Health; Resilience; Social Support   Sleep  Sleep: Good  Number of hours:  9   Physical Exam: Physical Exam Vitals and nursing note reviewed.  Constitutional:      General: He is not in acute distress.    Appearance: Normal appearance. He is not ill-appearing.  HENT:     Head: Normocephalic.  Eyes:     Conjunctiva/sclera: Conjunctivae normal.  Cardiovascular:     Rate and Rhythm: Normal rate.  Pulmonary:     Effort: Pulmonary effort is normal. No respiratory distress.  Musculoskeletal:        General: Normal range of motion.     Cervical back: Normal range of motion.  Skin:    General: Skin is warm and dry.  Neurological:     Mental Status: He is alert and oriented to person, place, and time.  Psychiatric:        Attention and Perception: Attention and perception normal. He does not perceive auditory or visual hallucinations.        Mood and Affect: Affect normal. Mood is anxious.        Speech: Speech normal.        Behavior: Behavior normal. Behavior is cooperative.        Thought Content: Thought content normal. Thought content is not paranoid or delusional. Thought content does not include homicidal or suicidal ideation.        Cognition and Memory: Cognition normal.        Judgment: Judgment is impulsive.    Review of Systems  Constitutional:        No other complaints voiced   Psychiatric/Behavioral:  Depression: Stable. Hallucinations: Denies. Substance abuse: Denies at this time "and trying to keep it that way". Suicidal ideas: Denies. Nervous/anxious: Stable. Insomnia: Sleeps better with medication.        Reports he has been out of jail for 2-3 days and was not sent home with medications he need to be back on.   All other systems reviewed and are  negative.  Blood pressure (!) 141/73, pulse 67, temperature 98.4 F (36.9 C), temperature source Oral, resp. rate 18, SpO2 99 %. There is no height or weight on file to calculate BMI.  Musculoskeletal: Strength & Muscle Tone: within normal limits Gait & Station: normal Patient leans: N/A   [facility] MSE Discharge Disposition for Follow up and Recommendations: Based on my evaluation the patient does not appear to have an emergency medical condition and can be discharged with resources and follow up care in outpatient services for Medication Management and Individual Therapy   Lynnsie Linders, NP 07/08/2022, 3:42 PM

## 2022-07-10 ENCOUNTER — Emergency Department (HOSPITAL_COMMUNITY): Payer: Medicaid Other

## 2022-07-10 ENCOUNTER — Other Ambulatory Visit: Payer: Self-pay

## 2022-07-10 ENCOUNTER — Emergency Department (HOSPITAL_COMMUNITY)
Admission: EM | Admit: 2022-07-10 | Discharge: 2022-07-10 | Disposition: A | Discharge status: Home / Self Care | Payer: Medicaid Other | Attending: Emergency Medicine | Admitting: Emergency Medicine

## 2022-07-10 DIAGNOSIS — S60041A Contusion of right ring finger without damage to nail, initial encounter: Secondary | ICD-10-CM | POA: Insufficient documentation

## 2022-07-10 DIAGNOSIS — S5012XA Contusion of left forearm, initial encounter: Secondary | ICD-10-CM | POA: Diagnosis not present

## 2022-07-10 DIAGNOSIS — M79632 Pain in left forearm: Secondary | ICD-10-CM | POA: Diagnosis present

## 2022-07-10 LAB — CBC WITH DIFFERENTIAL/PLATELET
Abs Immature Granulocytes: 0.04 10*3/uL (ref 0.00–0.07)
Basophils Absolute: 0.1 10*3/uL (ref 0.0–0.1)
Basophils Relative: 1 %
Eosinophils Absolute: 0.1 10*3/uL (ref 0.0–0.5)
Eosinophils Relative: 1 %
HCT: 38.6 % — ABNORMAL LOW (ref 39.0–52.0)
Hemoglobin: 12.9 g/dL — ABNORMAL LOW (ref 13.0–17.0)
Immature Granulocytes: 0 %
Lymphocytes Relative: 36 %
Lymphs Abs: 3.7 10*3/uL (ref 0.7–4.0)
MCH: 30.5 pg (ref 26.0–34.0)
MCHC: 33.4 g/dL (ref 30.0–36.0)
MCV: 91.3 fL (ref 80.0–100.0)
Monocytes Absolute: 1.1 10*3/uL — ABNORMAL HIGH (ref 0.1–1.0)
Monocytes Relative: 10 %
Neutro Abs: 5.4 10*3/uL (ref 1.7–7.7)
Neutrophils Relative %: 52 %
Platelets: 255 10*3/uL (ref 150–400)
RBC: 4.23 MIL/uL (ref 4.22–5.81)
RDW: 12.9 % (ref 11.5–15.5)
WBC: 10.3 10*3/uL (ref 4.0–10.5)
nRBC: 0 % (ref 0.0–0.2)

## 2022-07-10 LAB — BASIC METABOLIC PANEL
Anion gap: 10 (ref 5–15)
BUN: 19 mg/dL (ref 6–20)
CO2: 25 mmol/L (ref 22–32)
Calcium: 8.9 mg/dL (ref 8.9–10.3)
Chloride: 101 mmol/L (ref 98–111)
Creatinine, Ser: 0.91 mg/dL (ref 0.61–1.24)
GFR, Estimated: >60 mL/min (ref >60)
Glucose, Bld: 118 mg/dL — ABNORMAL HIGH (ref 70–99)
Potassium: 3.5 mmol/L (ref 3.5–5.1)
Sodium: 136 mmol/L (ref 135–145)

## 2022-07-10 MED ORDER — RISPERIDONE 1 MG PO TABS: 1.0000 mg | ORAL_TABLET | Freq: Once | ORAL | Status: DC

## 2022-07-10 MED ORDER — SERTRALINE HCL 50 MG PO TABS
50.0000 mg | ORAL_TABLET | Freq: Every day | ORAL | Status: DC
  Administered 2022-07-10: 50 mg via ORAL
  Filled 2022-07-10: qty 1
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
## 2022-07-10 NOTE — Discharge Instructions (Signed)
Please follow-up with your primary care provider regarding recent symptoms and ER visit.  Today your labs and imaging were all reassuring and you are given 1 dose of your risperidone and Zoloft.  Please follow-up with your primary care provider to be rechecked as symptoms can change.  If symptoms worsen please return to ER.

## 2022-07-10 NOTE — ED Notes (Signed)
Pt is alert and oriented x 4. Ambulatory. Ate [place] sandwich and sent home with bus pass. Cleared for discharge by provider.

## 2022-07-13 NOTE — Progress Notes (Unsigned)
Erroneous encounter-disregard

## 2022-07-15 ENCOUNTER — Encounter: Payer: Medicaid Other | Admitting: Family

## 2022-07-17 ENCOUNTER — Ambulatory Visit (HOSPITAL_COMMUNITY)
Admission: EM | Admit: 2022-07-17 | Discharge: 2022-07-17 | Disposition: A | Discharge status: Home / Self Care | Payer: Medicaid Other | Attending: Behavioral Health | Admitting: Behavioral Health

## 2022-07-17 DIAGNOSIS — F1990 Other psychoactive substance use, unspecified, uncomplicated: Secondary | ICD-10-CM

## 2022-07-17 DIAGNOSIS — Z5900 Homelessness unspecified: Secondary | ICD-10-CM

## 2022-07-17 NOTE — ED Provider Notes (Signed)
Behavioral Health Urgent Care Medical Screening Exam  Patient Name: Bryan Payne MRN: [id number] Date of Evaluation: 07/17/22 Chief Complaint: :I need to get back on my medications."  Diagnosis:  Final diagnoses:  Substance use disorder  Homelessness    History of Present illness: Bryan Payne is a 27 y.o. male patient with a past psychiatric history significant for schizophrenia, and polysubstance substance abuse (alcohol, cocaine use and LSD) who presents to the [facility] behavioral health urgent care voluntary accompanied by law enforcement for an evaluation, per patient's request. Per law enforcement, patient was detained for stealing food out of a corner store and was given a citation for stealing $10 worth of food.  On evaluation, patient is alert and oriented x 4. His thought process is linear and speech is clear and coherent. He denies SI/HI. He denies AVH and no psychosis observed on exam. His mood is euthymic and affect is congruent. He has fair eye contact. He is calm and cooperative and does not appear to be in acute distress. Patient states, I need to get back on my medications." He states that in the past he was prescribed risperidone, trazodone, Vistaril, and Zoloft for his schizophrenia. He states that he has been off his medications for over a month. He denies outpatient psychiatry. He states that he was recently released from jail for stealing and paraphernalia. He states that he has been homeless for the past 2 days because his mother kicked him out of the home. He declined consent for this provider to contact his mother for collateral information. He reports a history of using cocaine and LSD. He reports using cocaine for the past 2 days, on average a gram. He states that he last used cocaine last night. He is unable to recall how long he has been using cocaine. He reports a history of using LSD but denies recent use. He reports drinking a beer every day. He states that  he last consumed alcohol, one beer prior to being arrested. I discussed with the patient substance abuse treatment options. He states that he is not ready for treatment. I discussed with patient following up here at the [facility] outpatient clinic, Monday-Friday, 7:30 am to 11:00 am for medication management. Patient was offered community resources for shelter, and declined. Patient requested a bus pass. Patient was provided with a bus pass and safely discharged to the lobby.   Flowsheet Row ED from 07/17/2022 in [facility] ED from 07/10/2022 in [facility] Emergency Department at [facility] ED from 07/08/2022 in [facility]  C-SSRS RISK CATEGORY No Risk No Risk No Risk       Psychiatric Specialty Exam  Presentation  General Appearance:Disheveled  Eye Contact:Fair  Speech:Clear and Coherent  Speech Volume:Normal  Handedness:Right   Mood and Affect  Mood: Euthymic  Affect: Congruent   Thought Process  Thought Processes: Coherent  Descriptions of Associations:Intact  Orientation:Full (Time, Place and Person)  Thought Content:Logical  Diagnosis of Schizophrenia or Schizoaffective disorder in past: Yes  Duration of Psychotic Symptoms: Greater than six months  Hallucinations:None  Ideas of Reference:None  Suicidal Thoughts:No Without Intent; Without Plan  Homicidal Thoughts:No   Sensorium  Memory: Immediate Fair; Recent Fair; Remote Fair  Judgment: Intact  Insight: Present   Executive Functions  Concentration: Fair  Attention Span: Fair  Recall: [employer] of Knowledge: Fair  Language: Fair   Psychomotor Activity  Psychomotor Activity: Normal   Assets  Assets:  Communication Skills; Desire for Improvement; Leisure Time; Physical Health   Sleep  Sleep: Fair  Number of hours:  9   Physical Exam: Physical Exam Cardiovascular:     Rate and Rhythm: Normal rate.   Pulmonary:     Effort: Pulmonary effort is normal.  Musculoskeletal:        General: Normal range of motion.     Cervical back: Normal range of motion.  Neurological:     Mental Status: He is alert and oriented to person, place, and time.    Review of Systems  Constitutional: Negative.   HENT: Negative.    Eyes: Negative.   Respiratory: Negative.    Cardiovascular: Negative.   Gastrointestinal: Negative.   Genitourinary: Negative.   Musculoskeletal: Negative.   Neurological: Negative.   Endo/Heme/Allergies: Negative.   Psychiatric/Behavioral:  Positive for substance abuse.    There were no vitals taken for this visit. There is no height or weight on file to calculate BMI.  Musculoskeletal: Strength & Muscle Tone: within normal limits Gait & Station: normal Patient leans: N/A   [facility] MSE Discharge Disposition for Follow up and Recommendations: Based on my evaluation the patient does not appear to have an emergency medical condition and can be discharged with resources and follow up care in outpatient services for Medication Management, Individual Therapy, and Group Therapy  Discharge recommendations:   Outpatient Follow up: Please review list of outpatient resources for psychiatry and counseling. Please follow up with your primary care provider for all medical related needs.   You are encouraged to follow up with [facility] for outpatient treatment.  Walk in/ Open Access Hours: Monday - Friday 8AM - 11AM (To see provider and therapist) Friday - 1PM - 4PM (To see therapist only)  [facility] [address] [place], [place] [phone]  Therapy: We recommend that patient participate in individual therapy to address mental health concerns.  Safety:   The following safety precautions should be taken:   No sharp objects. This includes scissors, razors, scrapers, and putty knives.   Chemicals should be removed and locked up.    Medications should be removed and locked up.   Weapons should be removed and locked up. This includes firearms, knives and instruments that can be used to cause injury.   The patient should abstain from use of illicit substances/drugs and abuse of any medications.  If symptoms worsen or do not continue to improve or if the patient becomes actively suicidal or homicidal then it is recommended that the patient return to the closest hospital emergency department, the [facility], or call 911 for further evaluation and treatment. National Suicide Prevention Lifeline 1-800-SUICIDE or 774-750-5085.  About 988 988 offers 24/7 access to trained crisis counselors who can help people experiencing mental health-related distress. People can call or text 988 or chat 988lifeline.org for themselves or if they are worried about a loved one who may need crisis support.     Elania Crowl L, NP 07/17/2022, 1:17 PM

## 2022-07-17 NOTE — Discharge Instructions (Addendum)
Discharge recommendations:   Outpatient Follow up: Please review list of outpatient resources for psychiatry and counseling. Please follow up with your primary care provider for all medical related needs.   You are encouraged to follow up with [facility] for outpatient treatment.  Walk in/ Open Access Hours: Monday - Friday 8AM - 11AM (To see provider and therapist) Friday - 1PM - 4PM (To see therapist only)  [facility] [address] [place], [place] [phone]  Therapy: We recommend that patient participate in individual therapy to address mental health concerns.  Safety:   The following safety precautions should be taken:   No sharp objects. This includes scissors, razors, scrapers, and putty knives.   Chemicals should be removed and locked up.   Medications should be removed and locked up.   Weapons should be removed and locked up. This includes firearms, knives and instruments that can be used to cause injury.   The patient should abstain from use of illicit substances/drugs and abuse of any medications.  If symptoms worsen or do not continue to improve or if the patient becomes actively suicidal or homicidal then it is recommended that the patient return to the closest hospital emergency department, the [facility], or call 911 for further evaluation and treatment. National Suicide Prevention Lifeline 1-800-SUICIDE or 1-800-273-8255.  About 988 988 offers 24/7 access to trained crisis counselors who can help people experiencing mental health-related distress. People can call or text 988 or chat 988lifeline.org for themselves or if they are worried about a loved one who may need crisis support.     

## 2022-07-17 NOTE — Progress Notes (Signed)
   07/17/22 1242  [facility] Triage Screening (Walk-ins at [facility] only)  How Did You Hear About [place]? Legal System  What Is the Reason for Your Visit/Call Today? Pt is a 27 yo male who presents voluntarily escorted by [employer] to [facility]. Per [employer] pt was detained prior to coming to this facility for stealing food out of a corner store. Pt was given a citation stating that he stole $10 worth of food. Pt states he was was hungry. Pt was given food by this writer upon request. Pt reports that he has hx of schizophrenia. Pt reports that he was in jail for the past two months and recently was released "about 30 days ago". Pt reports that he is in need of his medications, pt reports he has been without medication for about 2 months. Pt reports daily cocaine use, last use was lastnight about 1 gram. Pt appears to be under the influence currently, disheveled, falling asleep during triage, leaning over in chair, unkempt. Pt denies SI, HI, and AVH. Pt is currently not connected to any outpatient services.  How Long Has This Been Causing You Problems? 1-6 months  Have You Recently Had Any Thoughts About Hurting Yourself? No  Are You Planning to Commit Suicide/Harm Yourself At This time? No  Have you Recently Had Thoughts About Hurting Someone Karolee Ohs? No  Are You Planning To Harm Someone At This Time? No  Are you currently experiencing any auditory, visual or other hallucinations? No  Have You Used Any Alcohol or Drugs in the Past 24 Hours? Yes  How long ago did you use Drugs or Alcohol? yesterday  What Did You Use and How Much? cocaine, 1 gram  Clinician description of patient physical appearance/behavior: unkempt, disheveled, leaning over in chair, falling asleep  What Do You Feel Would Help You the Most Today? Medication(s)  If access to [facility] was not available, would you have sought care in the Emergency Department? No  Determination of Need Routine (7 days)  Options For Referral Outpatient Therapy;Medication  Management

## 2022-09-11 ENCOUNTER — Other Ambulatory Visit: Payer: Self-pay | Admitting: Family Medicine

## 2022-09-16 ENCOUNTER — Telehealth: Payer: Self-pay | Admitting: Family Medicine

## 2022-09-16 NOTE — Telephone Encounter (Signed)
Patient schedule for appt

## 2022-09-16 NOTE — Telephone Encounter (Signed)
Pt is calling in because he is requesting a new prescription be sent in for hydrOXYzine (ATARAX) 25 MG tablet [[id number]]  and traZODone (DESYREL) 50 MG tablet [[id number]] to [facility] on [address] in [place]. Pt is also requesting Dr. Andrey Campanile give him a call because he needs to speak with her directly. Please follow up with pt.

## 2022-09-29 ENCOUNTER — Ambulatory Visit: Payer: Medicaid Other | Admitting: Family Medicine

## 2022-12-22 ENCOUNTER — Ambulatory Visit (INDEPENDENT_AMBULATORY_CARE_PROVIDER_SITE_OTHER): Payer: MEDICAID | Admitting: Family

## 2022-12-22 ENCOUNTER — Encounter: Payer: Self-pay | Admitting: Family

## 2022-12-22 VITALS — BP 125/73 | HR 62 | Temp 98.4°F | Ht 68.0 in | Wt 189.6 lb

## 2022-12-22 DIAGNOSIS — R238 Other skin changes: Secondary | ICD-10-CM | POA: Diagnosis not present

## 2022-12-22 DIAGNOSIS — R234 Changes in skin texture: Secondary | ICD-10-CM | POA: Diagnosis not present

## 2022-12-22 DIAGNOSIS — Z0120 Encounter for dental examination and cleaning without abnormal findings: Secondary | ICD-10-CM

## 2022-12-22 DIAGNOSIS — N529 Male erectile dysfunction, unspecified: Secondary | ICD-10-CM

## 2022-12-22 MED ORDER — SILDENAFIL CITRATE 25 MG PO TABS: ORAL_TABLET | ORAL | 0 refills | Status: DC

## 2022-12-22 NOTE — Progress Notes (Signed)
Patient wants to talk about his medications and the dosages.  Patient asking for multiple referrals.   Multiple complaints, asking for ADHD medications.

## 2022-12-22 NOTE — Progress Notes (Signed)
Patient ID: Bryan Payne, male    DOB: Jan 31, 1996  MRN: [id number]  CC: Follow-Up  Subjective: Bryan Payne is a 27 y.o. male who presents for follow-up.   His concerns today include:  - Requests referral to Dentistry for routine exam.  - Requests referral to Dermatology for irritation, discoloration, and flaking of scalp for several months.  - States difficulty getting and keeping an erection. Reports would like to begin Viagra.  - I discussed with patient in detail that his primary provider Bryan Skeans, MD recommends follow-up with Psychiatry for continued medication management. Patient verbalized understanding/agreement. He denies thoughts of self-harm, suicidal ideations, homicidal ideations.   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   Substance induced mood disorder (HCC) 03/21/2022   Addiction to crack 03/19/2022   Polysubstance abuse (HCC) 03/19/2022   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) 03/09/2022   Polysubstance (excluding opioids) dependence, daily use (HCC)    Adjustment disorder with disturbance of emotion 05/01/2017   Mushroom poisoning 12/22/2014   Acute renal failure (HCC) 12/22/2014   Cannabis abuse with cannabis-induced disorder (HCC) 12/22/2014   Dehydration 12/22/2014   Ingestion of unknown nonmedicinal substance 12/21/2014   Rectal bleeding 12/21/2014   Generalized abdominal pain 12/21/2014   Lateral meniscus tear 05/05/2013   Appendicitis, acute-gangrenous s/p laparoscopic appendectomy  10/13/2012     Current Outpatient Medications on File Prior to Visit  Medication Sig Dispense Refill   hydrOXYzine (VISTARIL) 50 MG capsule Take 50 mg by mouth 3 (three) times daily as needed for anxiety.     risperiDONE (RISPERDAL) 1 MG tablet Take 1 tablet (1 mg total) by mouth daily for 14 days. 14 tablet 0    sertraline (ZOLOFT) 50 MG tablet Take 1 tablet (50 mg total) by mouth daily for 7 days. 7 tablet 0   traZODone (DESYREL) 50 MG tablet Take 1 tablet (50 mg total) by mouth at bedtime as needed for up to 7 days for sleep. 7 tablet 0   nicotine (NICODERM CQ - DOSED IN MG/24 HOURS) 14 mg/24hr patch Place 1 patch (14 mg total) onto the skin daily. (Patient not taking: Reported on 04/30/2022) 28 patch 0   No current facility-administered medications on file prior to visit.    No Active Allergies  Social History   Socioeconomic History   Marital status: Single    Spouse name: Not on file   Number of children: Not on file   Years of education: Not on file   Highest education level: Not on file  Occupational History   Not on file  Tobacco Use   Smoking status: Every Day    Current packs/day: 0.50    Types: Cigars, Cigarettes    Passive exposure: Current   Smokeless tobacco: Never  Vaping Use   Vaping status: Never Used  Substance and Sexual Activity   Alcohol use: Yes    Comment: "often"   Drug use: Yes    Types: Marijuana, Methamphetamines, Cocaine    Comment: daily, hallucinagenics   Sexual activity: Not Currently  Other Topics Concern   Not on file  Social History Narrative   Not on file   Social Determinants of Health   Financial Resource Strain: Not on file  Food Insecurity: Food Insecurity Present (04/30/2022)   Hunger Vital Sign    Worried About Running Out of Food in the Last Year:  Often true    Ran Out of Food in the Last Year: Often true  Transportation Needs: Patient Declined (04/30/2022)   PRAPARE - Administrator, Civil Service (Medical): Patient declined    Lack of Transportation (Non-Medical): Patient declined  Physical Activity: Not on file  Stress: Not on file  Social Connections: Not on file  Intimate Partner Violence: Patient Declined (04/30/2022)   Humiliation, Afraid, Rape, and Kick questionnaire    Fear of Current or Ex-Partner: Patient  declined    Emotionally Abused: Patient declined    Physically Abused: Patient declined    Sexually Abused: Patient declined    Family History  Problem Relation Age of Onset   Kidney failure Father        Renal transplant   Diabetes Other        Family on father's side    Past Surgical History:  Procedure Laterality Date   APPENDECTOMY     KNEE ARTHROSCOPY WITH LATERAL MENISECTOMY Left 05/05/2013   Procedure: LEFT KNEE ARTHROSCOPY WITH LATERAL MENISCAL REPAIR;  Surgeon: Eulas Post, MD;  Location: [facility];  Service: Orthopedics;  Laterality: Left;   LAPAROSCOPIC APPENDECTOMY Right 10/13/2012   Procedure: APPENDECTOMY LAPAROSCOPIC;  Surgeon: Mariella Saa, MD;  Location: [facility];  Service: General;  Laterality: Right;    ROS: Review of Systems Negative except as stated above  PHYSICAL EXAM: BP 125/73   Pulse 62   Temp 98.4 F (36.9 C) (Oral)   Ht 5\' 8"  (1.727 m)   Wt 189 lb 9.6 oz (86 kg)   SpO2 95%   BMI 28.83 kg/m   Physical Exam HENT:     Head: Normocephalic and atraumatic.     Comments: Hypopigmented scalp, no additional presentation.     Nose: Nose normal.     Mouth/Throat:     Mouth: Mucous membranes are moist.     Pharynx: Oropharynx is clear.  Eyes:     Extraocular Movements: Extraocular movements intact.     Conjunctiva/sclera: Conjunctivae normal.     Pupils: Pupils are equal, round, and reactive to light.  Cardiovascular:     Rate and Rhythm: Normal rate and regular rhythm.     Pulses: Normal pulses.     Heart sounds: Normal heart sounds.  Pulmonary:     Effort: Pulmonary effort is normal.     Breath sounds: Normal breath sounds.  Musculoskeletal:        General: Normal range of motion.     Cervical back: Normal range of motion and neck supple.  Neurological:     General: No focal deficit present.     Mental Status: He is alert and oriented to person, place, and time.  Psychiatric:        Mood and Affect: Mood normal.         Behavior: Behavior normal.      ASSESSMENT AND PLAN: 1. Encounter for routine dental examination - Referral to Dentistry for evaluation/management.  - Ambulatory referral to Dentistry  2. Scalp irritation 3. Flaking of scalp - Referral to Dermatology for evaluation/management.  - Ambulatory referral to Dermatology  4. Erectile dysfunction, unspecified erectile dysfunction type - Sildenafil as prescribed. Counseled on medication adherence/adverse effects.  - Follow-up with primary provider in 4 weeks or sooner if needed.  - sildenafil (VIAGRA) 25 MG tablet; Take 1 tablet 1/2 hour to 1 hour prior to intercourse as needed. Limit use to 1/2 tablet or 1 tablet per 24 hours.  Dispense: 10 tablet; Refill: 0    Patient was given the opportunity to ask questions.  Patient verbalized understanding of the plan and was able to repeat key elements of the plan. Patient was given clear instructions to go to Emergency Department or return to medical center if symptoms don't improve, worsen, or new problems develop.The patient verbalized understanding.   Orders Placed This Encounter  Procedures   Ambulatory referral to Dentistry   Ambulatory referral to Dermatology     Requested Prescriptions   Signed Prescriptions Disp Refills   sildenafil (VIAGRA) 25 MG tablet 10 tablet 0    Sig: Take 1 tablet 1/2 hour to 1 hour prior to intercourse as needed. Limit use to 1/2 tablet or 1 tablet per 24 hours.    Return in about 1 week (around 12/29/2022) for Follow-Up or next available Bryan Skeans, MD .  Rema Fendt, NP

## 2022-12-24 ENCOUNTER — Telehealth: Payer: Self-pay | Admitting: Family Medicine

## 2022-12-29 ENCOUNTER — Ambulatory Visit: Payer: MEDICAID | Admitting: Family Medicine

## 2023-04-02 ENCOUNTER — Other Ambulatory Visit: Payer: Self-pay | Admitting: Family

## 2023-04-02 DIAGNOSIS — N529 Male erectile dysfunction, unspecified: Secondary | ICD-10-CM

## 2023-04-06 ENCOUNTER — Other Ambulatory Visit: Payer: Self-pay | Admitting: Family Medicine

## 2023-04-06 DIAGNOSIS — N529 Male erectile dysfunction, unspecified: Secondary | ICD-10-CM

## 2023-04-06 NOTE — Telephone Encounter (Signed)
 Copied from CRM [id number]. Topic: Clinical - Medication Refill >> Apr 06, 2023 11:43 AM Patsy Lager T wrote: Most Recent Primary Care Visit:  Provider: Rema Fendt  Department: [facility]  Visit Type: OFFICE VISIT  Date: 12/22/2022  Medication: sildenafil (VIAGRA) 25 MG tablet  Has the patient contacted their pharmacy? Yes Need to contact provider for refill  Is this the correct pharmacy for this prescription? Yes   This is the patient's preferred pharmacy:  [id number] - [place], [place] - [address] [place] [address] Phone: [phone] Fax: [phone]   Has the prescription been filled recently? Yes  Is the patient out of the medication? Yes  Has the patient been seen for an appointment in the last year OR does the patient have an upcoming appointment? Yes  Can we respond through MyChart? No  Agent: Please be advised that Rx refills may take up to 3 business days. We ask that you follow-up with your pharmacy.  Patient is requesting refills

## 2023-04-07 MED ORDER — SILDENAFIL CITRATE 25 MG PO TABS: ORAL_TABLET | ORAL | 0 refills | Status: AC

## 2023-04-07 NOTE — Telephone Encounter (Signed)
 Requested Prescriptions  Pending Prescriptions Disp Refills   sildenafil (VIAGRA) 25 MG tablet 10 tablet 0    Sig: Take 1 tablet 1/2 hour to 1 hour prior to intercourse as needed. Limit use to 1/2 tablet or 1 tablet per 24 hours.     Urology: Erectile Dysfunction Agents Passed - 04/07/2023 12:05 PM      Passed - AST in normal range and within 360 days    AST  Date Value Ref Range Status  05/05/2022 37 15 - 41 U/L Final         Passed - ALT in normal range and within 360 days    ALT  Date Value Ref Range Status  05/05/2022 37 0 - 44 U/L Final         Passed - Last BP in normal range    BP Readings from Last 1 Encounters:  12/22/22 125/73         Passed - Valid encounter within last 12 months    Recent Outpatient Visits           3 months ago Encounter for routine dental examination   [facility], Amy J, NP   1 year ago Schizophrenia, unspecified type [facility])   [facility], MD

## 2023-05-19 ENCOUNTER — Encounter (HOSPITAL_COMMUNITY): Payer: Self-pay

## 2023-05-19 ENCOUNTER — Emergency Department (HOSPITAL_COMMUNITY)
Admission: EM | Admit: 2023-05-19 | Discharge: 2023-05-19 | Disposition: A | Discharge status: Home / Self Care | Payer: MEDICAID | Attending: Emergency Medicine | Admitting: Emergency Medicine

## 2023-05-19 ENCOUNTER — Other Ambulatory Visit: Payer: Self-pay

## 2023-05-19 DIAGNOSIS — F1910 Other psychoactive substance abuse, uncomplicated: Secondary | ICD-10-CM | POA: Insufficient documentation

## 2023-05-19 LAB — CBC WITH DIFFERENTIAL/PLATELET
Abs Immature Granulocytes: 0.03 10*3/uL (ref 0.00–0.07)
Basophils Absolute: 0.1 10*3/uL (ref 0.0–0.1)
Basophils Relative: 1 %
Eosinophils Absolute: 0.2 10*3/uL (ref 0.0–0.5)
Eosinophils Relative: 2 %
HCT: 40.7 % (ref 39.0–52.0)
Hemoglobin: 13.9 g/dL (ref 13.0–17.0)
Immature Granulocytes: 0 %
Lymphocytes Relative: 36 %
Lymphs Abs: 3.5 10*3/uL (ref 0.7–4.0)
MCH: 30.7 pg (ref 26.0–34.0)
MCHC: 34.2 g/dL (ref 30.0–36.0)
MCV: 89.8 fL (ref 80.0–100.0)
Monocytes Absolute: 0.9 10*3/uL (ref 0.1–1.0)
Monocytes Relative: 9 %
Neutro Abs: 5.1 10*3/uL (ref 1.7–7.7)
Neutrophils Relative %: 52 %
Platelets: 255 10*3/uL (ref 150–400)
RBC: 4.53 MIL/uL (ref 4.22–5.81)
RDW: 12.8 % (ref 11.5–15.5)
WBC: 9.7 10*3/uL (ref 4.0–10.5)
nRBC: 0 % (ref 0.0–0.2)

## 2023-05-19 LAB — BASIC METABOLIC PANEL WITH GFR
Anion gap: 12 (ref 5–15)
BUN: 9 mg/dL (ref 6–20)
CO2: 24 mmol/L (ref 22–32)
Calcium: 9 mg/dL (ref 8.9–10.3)
Chloride: 100 mmol/L (ref 98–111)
Creatinine, Ser: 1.04 mg/dL (ref 0.61–1.24)
GFR, Estimated: >60 mL/min (ref >60)
Glucose, Bld: 87 mg/dL (ref 70–99)
Potassium: 3.2 mmol/L — ABNORMAL LOW (ref 3.5–5.1)
Sodium: 136 mmol/L (ref 135–145)

## 2023-05-19 MED ORDER — POTASSIUM CHLORIDE CRYS ER 20 MEQ PO TBCR
20.0000 meq | EXTENDED_RELEASE_TABLET | Freq: Once | ORAL | Status: AC
  Administered 2023-05-19: 20 meq via ORAL
  Filled 2023-05-19: qty 1

## 2023-05-19 NOTE — Discharge Instructions (Signed)
 Today you were seen for substance ingestion.  Please refrain from using illegal substances.  Thank you for letting us treat you today. After reviewing your labs, I feel you are safe to go home. Please follow up with your PCP in the next several days and provide them with your records from this visit. Return to the Emergency Room if pain becomes severe or symptoms worsen.

## 2023-05-19 NOTE — ED Notes (Signed)
 Red, blue, gold, green, & lav tubes sent to lab

## 2023-05-19 NOTE — ED Triage Notes (Addendum)
 Pt bibgpd, police believe patient may have swallowed some form of drug like crack cocaine. Pt has jerking movements currently. Pt states he did smoke some crack cocaine this morning.

## 2023-05-19 NOTE — ED Provider Notes (Signed)
  EMERGENCY DEPARTMENT AT [facility] Provider Note   CSN: [id number] Arrival date & time: 05/19/23  7829     History  Chief Complaint  Patient presents with   Drug Overdose    Bryan Payne is a 28 y.o. male past medical history significant for polysubstance abuse presents today accompanied by [employer] for substance ingestion.  Patient states that he smoked crack cocaine earlier this morning.  Patient denies any shortness of breath, pain, headache, chest pain, nausea, vomiting, diarrhea, or any other complaints at this time.  Patient denies SI/HI or AVH.  Per [employer] patient needs medical clearance.    Drug Overdose       Home Medications Prior to Admission medications   Medication Sig Start Date End Date Taking? Authorizing Provider  hydrOXYzine (VISTARIL) 50 MG capsule Take 50 mg by mouth 3 (three) times daily as needed for anxiety.    [provider]  nicotine (NICODERM CQ - DOSED IN MG/24 HOURS) 14 mg/24hr patch Place 1 patch (14 mg total) onto the skin daily. Patient not taking: Reported on 04/30/2022 03/22/22   Layla Barter, NP  risperiDONE (RISPERDAL) 1 MG tablet Take 1 tablet (1 mg total) by mouth daily for 14 days. 07/08/22 12/22/22  Rankin, Shuvon B, NP  sertraline (ZOLOFT) 50 MG tablet Take 1 tablet (50 mg total) by mouth daily for 7 days. 07/08/22 12/22/22  Rankin, Shuvon B, NP  sildenafil (VIAGRA) 25 MG tablet Take 1 tablet 1/2 hour to 1 hour prior to intercourse as needed. Limit use to 1/2 tablet or 1 tablet per 24 hours. 04/07/23   Georganna Skeans, MD  traZODone (DESYREL) 50 MG tablet Take 1 tablet (50 mg total) by mouth at bedtime as needed for up to 7 days for sleep. 07/08/22 12/22/22  Rankin, Shuvon B, NP      Allergies    Patient has no active allergies.    Review of Systems   Review of Systems  Physical Exam Updated Vital Signs BP (!) 140/70   Pulse (!) 59   Temp 98.4 F (36.9 C) (Oral)   Resp 18   SpO2 100%  Physical  Exam Vitals and nursing note reviewed.  Constitutional:      General: He is not in acute distress.    Appearance: Normal appearance. He is well-developed. He is not ill-appearing, toxic-appearing or diaphoretic.     Comments: Patient twitching and slightly erratic. Appears to be on a substance.  HENT:     Head: Normocephalic and atraumatic.     Right Ear: External ear normal.     Left Ear: External ear normal.     Nose: Nose normal.     Mouth/Throat:     Mouth: Mucous membranes are moist.  Eyes:     Extraocular Movements: Extraocular movements intact.     Conjunctiva/sclera: Conjunctivae normal.  Cardiovascular:     Rate and Rhythm: Normal rate and regular rhythm.     Pulses: Normal pulses.  Pulmonary:     Effort: Pulmonary effort is normal. No respiratory distress.     Breath sounds: Normal breath sounds.  Abdominal:     Palpations: Abdomen is soft.     Tenderness: There is no abdominal tenderness.  Musculoskeletal:        General: No swelling or deformity. Normal range of motion.     Cervical back: Normal range of motion and neck supple.     Right lower leg: No edema.     Left  lower leg: No edema.  Skin:    General: Skin is warm and dry.     Capillary Refill: Capillary refill takes less than 2 seconds.  Neurological:     General: No focal deficit present.     Mental Status: He is alert and oriented to person, place, and time.  Psychiatric:        Mood and Affect: Mood normal.        Behavior: Behavior is hyperactive. Behavior is cooperative.     ED Results / Procedures / Treatments   Labs (all labs ordered are listed, but only abnormal results are displayed) Labs Reviewed  BASIC METABOLIC PANEL WITH GFR - Abnormal; Notable for the following components:      Result Value   Potassium 3.2 (*)    All other components within normal limits  CBC WITH DIFFERENTIAL/PLATELET    EKG EKG Interpretation Date/Time:  Wednesday May 19 2023 08:10:58 EDT Ventricular Rate:   75 PR Interval:  172 QRS Duration:  88 QT Interval:  480 QTC Calculation: 536 R Axis:   83  Text Interpretation: Normal sinus rhythm Prolonged QT Abnormal ECG When compared with ECG of 05-May-2022 05:27, PREVIOUS ECG IS PRESENT since last tracing no significant change Confirmed by Rolan Bucco [phone]) on 05/19/2023 8:39:14 AM  Radiology No results found.  Procedures Procedures    Medications Ordered in ED Medications  potassium chloride SA (KLOR-CON M) CR tablet 20 mEq (20 mEq Oral Given 05/19/23 0850)    ED Course/ Medical Decision Making/ A&P                                 Medical Decision Making  This patient presents to the ED with chief complaint(s) of Drug ingestion with pertinent past medical history of substance abuse which further complicates the presenting complaint. The complaint involves an extensive differential diagnosis and also carries with it a high risk of complications and morbidity.    The differential diagnosis includes arrhythmia, substance ingestion  Additional history obtained: Records reviewed Primary Care Documents  ED Course and Reassessment: Patient's potassium repleted while in ED  Independent labs interpretation:  The following labs were independently interpreted:  CBC: No notable findings BMP: Mild hypokalemia 3.2 EKG: NSR, prolonged QT  Consultation: - Consulted or discussed management/test interpretation w/ external professional: None  Consideration for admission or further workup: Considered for admission or further workup however patient's vital signs, physical exam, and labs are reassuring.  Patient able to tolerate p.o. intake and potassium repleted prior to discharge.  Patient has no complaints at this time.  I feel patient is safe for discharge at this time.        Final Clinical Impression(s) / ED Diagnoses Final diagnoses:  Polysubstance abuse [facility])    Rx / DC Orders ED Discharge Orders     None          Dolphus Jenny, PA-C 05/19/23 6045    Rolan Bucco, MD 05/19/23 1427

## 2023-05-19 NOTE — ED Notes (Signed)
Pt given lunch bag and ginger ale
